# Patient Record
Sex: Female | Born: 1950 | Race: White | Hispanic: No | Marital: Married | Smoking: Never smoker
Health system: Southern US, Community
[De-identification: ages and names within clinical notes are randomized; demographics above are authoritative.]

## PROBLEM LIST (undated history)

## (undated) DIAGNOSIS — M797 Fibromyalgia: Secondary | ICD-10-CM

## (undated) DIAGNOSIS — G709 Myoneural disorder, unspecified: Secondary | ICD-10-CM

## (undated) DIAGNOSIS — M1990 Unspecified osteoarthritis, unspecified site: Secondary | ICD-10-CM

## (undated) DIAGNOSIS — Z87442 Personal history of urinary calculi: Secondary | ICD-10-CM

## (undated) DIAGNOSIS — G629 Polyneuropathy, unspecified: Secondary | ICD-10-CM

## (undated) DIAGNOSIS — E119 Type 2 diabetes mellitus without complications: Secondary | ICD-10-CM

## (undated) DIAGNOSIS — I10 Essential (primary) hypertension: Secondary | ICD-10-CM

## (undated) DIAGNOSIS — N200 Calculus of kidney: Secondary | ICD-10-CM

## (undated) DIAGNOSIS — F419 Anxiety disorder, unspecified: Secondary | ICD-10-CM

---

## 1995-06-05 HISTORY — PX: NEUROMA SURGERY: SHX722

## 1998-06-04 HISTORY — PX: ABDOMINAL HYSTERECTOMY: SHX81

## 2005-08-15 ENCOUNTER — Ambulatory Visit: Payer: Self-pay | Admitting: Orthopedic Surgery

## 2005-11-28 ENCOUNTER — Ambulatory Visit: Payer: Self-pay | Admitting: Cardiology

## 2009-07-13 ENCOUNTER — Ambulatory Visit (HOSPITAL_COMMUNITY)
Admission: RE | Admit: 2009-07-13 | Discharge: 2009-07-13 | Payer: Self-pay | Source: Home / Self Care | Admitting: Urology

## 2009-07-20 ENCOUNTER — Ambulatory Visit (HOSPITAL_COMMUNITY): Admission: RE | Admit: 2009-07-20 | Discharge: 2009-07-20 | Payer: Self-pay | Admitting: Urology

## 2010-06-04 HISTORY — PX: LITHOTRIPSY: SUR834

## 2010-06-04 HISTORY — PX: CYSTOSCOPY W/ URETERAL STENT PLACEMENT: SHX1429

## 2011-02-01 ENCOUNTER — Ambulatory Visit (HOSPITAL_COMMUNITY)
Admission: RE | Admit: 2011-02-01 | Discharge: 2011-02-01 | Disposition: A | Discharge status: Home / Self Care | Payer: Medicare Other | Source: Ambulatory Visit | Attending: Urology | Admitting: Urology

## 2011-02-01 ENCOUNTER — Other Ambulatory Visit (HOSPITAL_COMMUNITY): Payer: Self-pay | Admitting: Urology

## 2011-02-01 DIAGNOSIS — N201 Calculus of ureter: Secondary | ICD-10-CM

## 2011-02-01 DIAGNOSIS — R1031 Right lower quadrant pain: Secondary | ICD-10-CM | POA: Insufficient documentation

## 2011-02-01 DIAGNOSIS — N200 Calculus of kidney: Secondary | ICD-10-CM | POA: Insufficient documentation

## 2011-06-05 HISTORY — PX: CYSTOSCOPY W/ URETERAL STENT REMOVAL: SHX1430

## 2013-08-25 NOTE — Patient Instructions (Addendum)
Allison Oneill  08/25/2013   Your procedure is scheduled on:  08/27/2013  Report to [facility] at  37  AM.  Call this number if you have problems the morning of surgery: [phone]   Remember:   Do not eat food or drink liquids after midnight.   Take these medicines the morning of surgery with A SIP OF WATER: Allegra, Flonase spray,    Do not wear jewelry, make-up or nail polish.  Do not wear lotions, powders, or perfumes.   Do not shave 48 hours prior to surgery. Men may shave face and neck.  Do not bring valuables to the hospital.  [facility] is not responsible for any belongings or valuables.               Contacts, dentures or bridgework may not be worn into surgery.  Leave suitcase in the car. After surgery it may be brought to your room.  For patients admitted to the hospital, discharge time is determined by your treatment team.               Patients discharged the day of surgery will not be allowed to drive home.  Name and phone number of your driver: family  Special Instructions: Shower using CHG 2 nights before surgery and the night before surgery.  If you shower the day of surgery use CHG.  Use special wash - you have one bottle of CHG for all showers.  You should use approximately 1/3 of the bottle for each shower.   Please read over the following fact sheets that you were given: Pain Booklet, Coughing and Deep Breathing, Surgical Site Infection Prevention, Anesthesia Post-op Instructions and Care and Recovery After Surgery Cystoscopy Cystoscopy is a procedure that is used to help your caregiver diagnose and sometimes treat conditions that affect your lower urinary tract. Your lower urinary tract includes your bladder and the tube through which urine passes from your bladder out of your body (urethra). Cystoscopy is performed with a thin, tube-shaped instrument (cystoscope). The cystoscope has lenses and a light at the end so that your caregiver can see inside your  bladder. The cystoscope is inserted at the entrance of your urethra. Your caregiver guides it through your urethra and into your bladder. There are two main types of cystoscopy:  Flexible cystoscopy (with a flexible cystoscope).  Rigid cystoscopy (with a rigid cystoscope). Cystoscopy may be recommended for many conditions, including:  Urinary tract infections.  Blood in your urine (hematuria).  Loss of bladder control (urinary incontinence) or overactive bladder.  Unusual cells found in a urine sample.  Urinary blockage.  Painful urination. Cystoscopy may also be done to remove a sample of your tissue to be checked under a microscope (biopsy). It may also be done to remove or destroy bladder stones. LET YOUR CAREGIVER KNOW ABOUT:  Allergies to food or medicine.  Medicines taken, including vitamins, herbs, eyedrops, over-the-counter medicines, and creams.  Use of steroids (by mouth or creams).  Previous problems with anesthetics or numbing medicines.  History of bleeding problems or blood clots.  Previous surgery.  Other health problems, including diabetes and kidney problems.  Possibility of pregnancy, if this applies. PROCEDURE The area around the opening to your urethra will be cleaned. A medicine to numb your urethra (local anesthetic) is used. If a tissue sample or stone is removed during the procedure, you may be given a medicine to make you sleep (general anesthetic). Your caregiver will  gently insert the tip of the cystoscope into your urethra. The cystoscope will be slowly glided through your urethra and into your bladder. Sterile fluid will flow through the cystoscope and into your bladder. The fluid will expand and stretch your bladder. This gives your caregiver a better view of your bladder walls. The procedure lasts about 15 20 minutes. AFTER THE PROCEDURE If a local anesthetic is used, you will be allowed to go home as soon as you are ready. If a general  anesthetic is used, you will be taken to a recovery area until you are stable. You may have temporary bleeding and burning on urination. Document Released: 05/18/2000 Document Revised: 02/13/2012 Document Reviewed: 11/12/2011 [facility] Patient Information 2014 [place]. PATIENT INSTRUCTIONS POST-ANESTHESIA  IMMEDIATELY FOLLOWING SURGERY:  Do not drive or operate machinery for the first twenty four hours after surgery.  Do not make any important decisions for twenty four hours after surgery or while taking narcotic pain medications or sedatives.  If you develop intractable nausea and vomiting or a severe headache please notify your doctor immediately.  FOLLOW-UP:  Please make an appointment with your surgeon as instructed. You do not need to follow up with anesthesia unless specifically instructed to do so.  WOUND CARE INSTRUCTIONS (if applicable):  Keep a dry clean dressing on the anesthesia/puncture wound site if there is drainage.  Once the wound has quit draining you may leave it open to air.  Generally you should leave the bandage intact for twenty four hours unless there is drainage.  If the epidural site drains for more than 36-48 hours please call the anesthesia department.  QUESTIONS?:  Please feel free to call your physician or the hospital operator if you have any questions, and they will be happy to assist you.

## 2013-08-26 ENCOUNTER — Encounter (HOSPITAL_COMMUNITY): Payer: Self-pay

## 2013-08-26 ENCOUNTER — Encounter (HOSPITAL_COMMUNITY): Payer: Self-pay | Admitting: Pharmacy Technician

## 2013-08-26 ENCOUNTER — Encounter (HOSPITAL_COMMUNITY)
Admission: RE | Admit: 2013-08-26 | Discharge: 2013-08-26 | Disposition: A | Discharge status: Home / Self Care | Payer: Medicare Other | Source: Ambulatory Visit | Attending: Urology | Admitting: Urology

## 2013-08-26 HISTORY — DX: Myoneural disorder, unspecified: G70.9

## 2013-08-26 HISTORY — DX: Fibromyalgia: M79.7

## 2013-08-26 HISTORY — DX: Anxiety disorder, unspecified: F41.9

## 2013-08-26 LAB — BASIC METABOLIC PANEL
BUN: 19 mg/dL (ref 6–23)
CHLORIDE: 104 meq/L (ref 96–112)
CO2: 30 meq/L (ref 19–32)
Calcium: 10.9 mg/dL — ABNORMAL HIGH (ref 8.4–10.5)
Creatinine, Ser: 0.96 mg/dL (ref 0.50–1.10)
GFR calc non Af Amer: 61 mL/min — ABNORMAL LOW (ref >90)
GFR, EST AFRICAN AMERICAN: 71 mL/min — AB (ref >90)
GLUCOSE: 162 mg/dL — AB (ref 70–99)
POTASSIUM: 3.7 meq/L (ref 3.7–5.3)
SODIUM: 146 meq/L (ref 137–147)

## 2013-08-26 LAB — HEMOGLOBIN AND HEMATOCRIT, BLOOD
HCT: 37.6 % (ref 36.0–46.0)
Hemoglobin: 12.4 g/dL (ref 12.0–15.0)

## 2013-08-26 NOTE — Progress Notes (Signed)
Tried to call medical records to confirm history and physical dictation by Dr. Michela Pitcher. Medical records closed at 4pm. Contacted Dr. Michela Pitcher. He stated that he did dictate H&P this morning.

## 2013-08-27 ENCOUNTER — Encounter (HOSPITAL_COMMUNITY): Payer: Medicare Other | Admitting: Anesthesiology

## 2013-08-27 ENCOUNTER — Ambulatory Visit (HOSPITAL_COMMUNITY): Payer: Medicare Other | Admitting: Anesthesiology

## 2013-08-27 ENCOUNTER — Ambulatory Visit (HOSPITAL_COMMUNITY)
Admission: RE | Admit: 2013-08-27 | Discharge: 2013-08-27 | Disposition: A | Discharge status: Home / Self Care | Payer: Medicare Other | Source: Ambulatory Visit | Attending: Urology | Admitting: Urology

## 2013-08-27 ENCOUNTER — Encounter (HOSPITAL_COMMUNITY): Payer: Self-pay | Admitting: *Deleted

## 2013-08-27 ENCOUNTER — Encounter (HOSPITAL_COMMUNITY)
Admission: RE | Disposition: A | Discharge status: Home / Self Care | Payer: Self-pay | Source: Ambulatory Visit | Attending: Urology

## 2013-08-27 DIAGNOSIS — Z79899 Other long term (current) drug therapy: Secondary | ICD-10-CM | POA: Insufficient documentation

## 2013-08-27 DIAGNOSIS — F411 Generalized anxiety disorder: Secondary | ICD-10-CM | POA: Insufficient documentation

## 2013-08-27 DIAGNOSIS — N210 Calculus in bladder: Secondary | ICD-10-CM | POA: Insufficient documentation

## 2013-08-27 HISTORY — PX: CYSTOSCOPY: SHX5120

## 2013-08-27 LAB — GLUCOSE, CAPILLARY: GLUCOSE-CAPILLARY: 111 mg/dL — AB (ref 70–99)

## 2013-08-27 SURGERY — CYSTOSCOPY
Anesthesia: General | Site: Bladder

## 2013-08-27 MED ORDER — 0.9 % SODIUM CHLORIDE (POUR BTL) OPTIME
TOPICAL | Status: DC | PRN
  Administered 2013-08-27: 2000 mL

## 2013-08-27 MED ORDER — MIDAZOLAM HCL 2 MG/2ML IJ SOLN
INTRAMUSCULAR | Status: AC
  Filled 2013-08-27: qty 2

## 2013-08-27 MED ORDER — STERILE WATER FOR IRRIGATION IR SOLN
Status: DC | PRN
  Administered 2013-08-27: 1000 mL

## 2013-08-27 MED ORDER — LACTATED RINGERS IV SOLN: INTRAVENOUS | Status: DC

## 2013-08-27 MED ORDER — PROPOFOL 10 MG/ML IV EMUL
INTRAVENOUS | Status: AC
  Filled 2013-08-27: qty 20

## 2013-08-27 MED ORDER — FENTANYL CITRATE 0.05 MG/ML IJ SOLN
INTRAMUSCULAR | Status: AC
  Filled 2013-08-27: qty 5

## 2013-08-27 MED ORDER — SUCCINYLCHOLINE CHLORIDE 20 MG/ML IJ SOLN
INTRAMUSCULAR | Status: AC
  Filled 2013-08-27: qty 1

## 2013-08-27 MED ORDER — SODIUM CHLORIDE 0.9 % IR SOLN
Status: DC | PRN
  Administered 2013-08-27 (×2): 3000 mL via INTRAVESICAL

## 2013-08-27 MED ORDER — FENTANYL CITRATE 0.05 MG/ML IJ SOLN
25.0000 ug | INTRAMUSCULAR | Status: DC | PRN
Start: 2013-08-27 — End: 2013-08-27

## 2013-08-27 MED ORDER — ONDANSETRON HCL 4 MG/2ML IJ SOLN
INTRAMUSCULAR | Status: AC
  Filled 2013-08-27: qty 2

## 2013-08-27 MED ORDER — LIDOCAINE HCL (CARDIAC) 20 MG/ML IV SOLN
INTRAVENOUS | Status: DC | PRN
  Administered 2013-08-27: 50 mg via INTRAVENOUS

## 2013-08-27 MED ORDER — FENTANYL CITRATE 0.05 MG/ML IJ SOLN
INTRAMUSCULAR | Status: AC
  Filled 2013-08-27: qty 2

## 2013-08-27 MED ORDER — MIDAZOLAM HCL 2 MG/2ML IJ SOLN
1.0000 mg | INTRAMUSCULAR | Status: DC | PRN
  Administered 2013-08-27: 2 mg via INTRAVENOUS

## 2013-08-27 MED ORDER — ONDANSETRON HCL 4 MG/2ML IJ SOLN: 4.0000 mg | Freq: Once | INTRAMUSCULAR | Status: DC | PRN

## 2013-08-27 MED ORDER — LIDOCAINE HCL (PF) 1 % IJ SOLN
INTRAMUSCULAR | Status: AC
  Filled 2013-08-27: qty 10

## 2013-08-27 MED ORDER — LACTATED RINGERS IV SOLN
INTRAVENOUS | Status: DC | PRN
  Administered 2013-08-27: 07:00:00 via INTRAVENOUS

## 2013-08-27 MED ORDER — FENTANYL CITRATE 0.05 MG/ML IJ SOLN
25.0000 ug | INTRAMUSCULAR | Status: AC
  Administered 2013-08-27: 25 ug via INTRAVENOUS

## 2013-08-27 MED ORDER — ONDANSETRON HCL 4 MG/2ML IJ SOLN
4.0000 mg | Freq: Once | INTRAMUSCULAR | Status: AC
  Administered 2013-08-27: 4 mg via INTRAVENOUS

## 2013-08-27 MED ORDER — PROPOFOL 10 MG/ML IV BOLUS
INTRAVENOUS | Status: DC | PRN
  Administered 2013-08-27: 150 mg via INTRAVENOUS

## 2013-08-27 MED ORDER — FENTANYL CITRATE 0.05 MG/ML IJ SOLN
INTRAMUSCULAR | Status: DC | PRN
  Administered 2013-08-27: 25 ug via INTRAVENOUS
  Administered 2013-08-27: 50 ug via INTRAVENOUS
  Administered 2013-08-27: 100 ug via INTRAVENOUS

## 2013-08-27 SURGICAL SUPPLY — 18 items
BAG DRAIN URO TABLE W/ADPT NS (DRAPE) ×3 IMPLANT
BAG DRN 8 ADPR NS SKTRN CSTL (DRAPE) ×1
BAG HAMPER (MISCELLANEOUS) ×3 IMPLANT
CLOTH BEACON ORANGE TIMEOUT ST (SAFETY) ×3 IMPLANT
GLOVE BIO SURGEON STRL SZ7 (GLOVE) ×3 IMPLANT
GLOVE BIOGEL PI IND STRL 7.0 (GLOVE) ×2 IMPLANT
GLOVE BIOGEL PI INDICATOR 7.0 (GLOVE) ×4
GLOVE ECLIPSE 6.5 STRL STRAW (GLOVE) ×2 IMPLANT
GOWN STRL REUS W/TWL LRG LVL3 (GOWN DISPOSABLE) ×3 IMPLANT
IV NS IRRIG 3000ML ARTHROMATIC (IV SOLUTION) ×6 IMPLANT
KIT ROOM TURNOVER AP CYSTO (KITS) ×3 IMPLANT
MANIFOLD NEPTUNE II (INSTRUMENTS) ×3 IMPLANT
NS IRRIG 1000ML POUR BTL (IV SOLUTION) ×4 IMPLANT
PACK CYSTO (CUSTOM PROCEDURE TRAY) ×3 IMPLANT
PAD ARMBOARD 7.5X6 YLW CONV (MISCELLANEOUS) ×3 IMPLANT
SET IRRIGATING DISP (SET/KITS/TRAYS/PACK) ×3 IMPLANT
TOWEL OR 17X26 4PK STRL BLUE (TOWEL DISPOSABLE) ×3 IMPLANT
WATER STERILE IRR 1000ML POUR (IV SOLUTION) ×3 IMPLANT

## 2013-08-27 NOTE — Preoperative (Signed)
Beta Blockers   Reason not to administer Beta Blockers:Not Applicable 

## 2013-08-27 NOTE — Transfer of Care (Signed)
Immediate Anesthesia Transfer of Care Note  Patient: Allison Oneill  Procedure(s) Performed: Procedure(s): CYSTOSCOPY; REMOVAL OF BLADDER CALCULUS (N/A)  Patient Location: PACU  Anesthesia Type:General  Level of Consciousness: awake, alert , oriented and patient cooperative  Airway & Oxygen Therapy: Patient Spontanous Breathing and Patient connected to face mask oxygen  Post-op Assessment: Report given to PACU RN and Post -op Vital signs reviewed and stable  Post vital signs: Reviewed and stable  Complications: No apparent anesthesia complications

## 2013-08-27 NOTE — Anesthesia Procedure Notes (Signed)
Procedure Name: LMA Insertion Date/Time: 08/27/2013 7:45 AM Performed by: Andree Elk, Ladislao Cohenour A Pre-anesthesia Checklist: Patient identified, Timeout performed, Emergency Drugs available, Suction available and Patient being monitored Patient Re-evaluated:Patient Re-evaluated prior to inductionOxygen Delivery Method: Circle system utilized Preoxygenation: Pre-oxygenation with 100% oxygen Intubation Type: IV induction Ventilation: Mask ventilation without difficulty LMA: LMA inserted LMA Size: 4.0 Number of attempts: 1 Placement Confirmation: positive ETCO2 and breath sounds checked- equal and bilateral Tube secured with: Tape Dental Injury: Teeth and Oropharynx as per pre-operative assessment

## 2013-08-27 NOTE — Brief Op Note (Signed)
08/27/2013  8:14 AM  PATIENT:  Allison Oneill  62 y.o. female  PRE-OPERATIVE DIAGNOSIS:  Bladder Calculus  POST-OPERATIVE DIAGNOSIS:  Bladder Calculus  PROCEDURE:  Procedure(s): CYSTOSCOPY; REMOVAL OF BLADDER CALCULUS (N/A)  SURGEON:  Surgeon(s) and Role:    * [name], MD - Primary  PHYSICIAN ASSISTANT:   ASSISTANTS: none   ANESTHESIA:   general  EBL:  Total I/O In: 500 [I.V.:500] Out: -   BLOOD ADMINISTERED:none  DRAINS: none   LOCAL MEDICATIONS USED:  Amount: none  SPECIMEN:  No Specimenstone given to the pt to bring it in office to send to the lab.  DISPOSITION OF SPECIMEN:  N/A  COUNTS:  YES  TOURNIQUET:  * No tourniquets in log *  DICTATION: .Other Dictation: Dictation Number dictation [id number]  PLAN OF CARE: Discharge to home after PACU  PATIENT DISPOSITION:  PACU - hemodynamically stable.   Delay start of Pharmacological VTE agent (>24hrs) due to surgical blood loss or risk of bleeding:

## 2013-08-27 NOTE — Discharge Instructions (Signed)
Will see in office in 2weeks call for any fever bleeding or pain  Cystoscopy, Care After Refer to this sheet in the next few weeks. These instructions provide you with information on caring for yourself after your procedure. Your caregiver may also give you more specific instructions. Your treatment has been planned according to current medical practices, but problems sometimes occur. Call your caregiver if you have any problems or questions after your procedure. HOME CARE INSTRUCTIONS  Things you can do to ease any discomfort after your procedure include:  Drinking enough water and fluids to keep your urine clear or pale yellow.  Taking a warm bath to relieve any burning feelings. SEEK IMMEDIATE MEDICAL CARE IF:   You have an increase in blood in your urine.  You notice blood clots in your urine.  You have difficulty passing urine.  You have the chills.  You have abdominal pain.  You have a fever or persistent symptoms for more than 2 3 days.  You have a fever and your symptoms suddenly get worse. MAKE SURE YOU:   Understand these instructions.  Will watch your condition.  Will get help right away if you are not doing well or get worse.

## 2013-08-27 NOTE — Anesthesia Preprocedure Evaluation (Signed)
Anesthesia Evaluation  Patient identified by MRN, date of birth, ID band Patient awake    Reviewed: Allergy & Precautions, H&P , NPO status , Patient's Chart, lab work & pertinent test results  History of Anesthesia Complications Negative for: history of anesthetic complications  Airway Mallampati: I TM Distance: >3 FB Neck ROM: Full    Dental  (+) Teeth Intact   Pulmonary neg pulmonary ROS,  breath sounds clear to auscultation        Cardiovascular negative cardio ROS  Rhythm:Regular Rate:Normal     Neuro/Psych PSYCHIATRIC DISORDERS Anxiety    GI/Hepatic negative GI ROS,   Endo/Other    Renal/GU      Musculoskeletal  (+) Fibromyalgia -  Abdominal   Peds  Hematology   Anesthesia Other Findings   Reproductive/Obstetrics                           Anesthesia Physical Anesthesia Plan  ASA: II  Anesthesia Plan: General   Post-op Pain Management:    Induction: Intravenous  Airway Management Planned: LMA  Additional Equipment:   Intra-op Plan:   Post-operative Plan: Extubation in OR  Informed Consent: I have reviewed the patients History and Physical, chart, labs and discussed the procedure including the risks, benefits and alternatives for the proposed anesthesia with the patient or authorized representative who has indicated his/her understanding and acceptance.     Plan Discussed with:   Anesthesia Plan Comments:         Anesthesia Quick Evaluation

## 2013-08-27 NOTE — Progress Notes (Signed)
No change in H&P on reexamination. 

## 2013-08-27 NOTE — Anesthesia Postprocedure Evaluation (Signed)
  Anesthesia Post-op Note  Patient: Allison Oneill  Procedure(s) Performed: Procedure(s): CYSTOSCOPY; REMOVAL OF BLADDER CALCULUS (N/A)  Patient Location: PACU  Anesthesia Type:General  Level of Consciousness: awake, alert , oriented and patient cooperative  Airway and Oxygen Therapy: Patient Spontanous Breathing and Patient connected to face mask oxygen  Post-op Pain: none  Post-op Assessment: Post-op Vital signs reviewed, Patient's Cardiovascular Status Stable, Respiratory Function Stable, Patent Airway, No signs of Nausea or vomiting and Pain level controlled  Post-op Vital Signs: Reviewed and stable  Complications: No apparent anesthesia complications

## 2013-08-28 ENCOUNTER — Encounter (HOSPITAL_COMMUNITY): Payer: Self-pay | Admitting: Urology

## 2013-08-28 NOTE — Op Note (Signed)
NAMEJINI, Allison Oneill               ACCOUNT NO.:  [id number]  MEDICAL RECORD NO.:  [id number]  LOCATION:  [facility]                          FACILITY:  [facility]  PHYSICIAN:  [name], M.D.DATE OF BIRTH:  January 02, 1951  DATE OF PROCEDURE: DATE OF DISCHARGE:  08/27/2013                              OPERATIVE REPORT   PREOPERATIVE DIAGNOSIS:  Bladder stone.  POSTOPERATIVE DIAGNOSIS:  Bladder stone.  PROCEDURE:  Cysto extraction of bladder stone.  ANESTHESIA:  General.  DESCRIPTION OF PROCEDURE:  The patient under general anesthesia in lithotomy position, after usual prep and drape, #25 cystoscope introduced into the bladder.  It was inspected.  The stone was visualized.  It was held with the help of a foreign body forceps.  We removed without any complications.  The patient left the operating room in satisfactory condition.  There was only 1 stone which I removed and there was no other stone. Bladder was inspected, looks fine.  The patient left the operating room in satisfactory condition.     [name], M.D.     MIJ/MEDQ  D:  08/27/2013  T:  08/27/2013  Job:  [id number]

## 2014-07-14 DIAGNOSIS — I739 Peripheral vascular disease, unspecified: Secondary | ICD-10-CM | POA: Diagnosis not present

## 2014-07-14 DIAGNOSIS — L609 Nail disorder, unspecified: Secondary | ICD-10-CM | POA: Diagnosis not present

## 2014-07-14 DIAGNOSIS — L110 Acquired keratosis follicularis: Secondary | ICD-10-CM | POA: Diagnosis not present

## 2014-07-22 DIAGNOSIS — E780 Pure hypercholesterolemia: Secondary | ICD-10-CM | POA: Diagnosis not present

## 2014-07-22 DIAGNOSIS — N3000 Acute cystitis without hematuria: Secondary | ICD-10-CM | POA: Diagnosis not present

## 2014-07-22 DIAGNOSIS — R590 Localized enlarged lymph nodes: Secondary | ICD-10-CM | POA: Diagnosis not present

## 2014-07-22 DIAGNOSIS — I10 Essential (primary) hypertension: Secondary | ICD-10-CM | POA: Diagnosis not present

## 2014-07-28 DIAGNOSIS — N200 Calculus of kidney: Secondary | ICD-10-CM | POA: Diagnosis not present

## 2014-07-28 DIAGNOSIS — Z1389 Encounter for screening for other disorder: Secondary | ICD-10-CM | POA: Diagnosis not present

## 2014-07-28 DIAGNOSIS — I10 Essential (primary) hypertension: Secondary | ICD-10-CM | POA: Diagnosis not present

## 2014-07-28 DIAGNOSIS — E780 Pure hypercholesterolemia: Secondary | ICD-10-CM | POA: Diagnosis not present

## 2014-09-01 DIAGNOSIS — R300 Dysuria: Secondary | ICD-10-CM | POA: Diagnosis not present

## 2014-09-01 DIAGNOSIS — M545 Low back pain: Secondary | ICD-10-CM | POA: Diagnosis not present

## 2014-09-01 DIAGNOSIS — R110 Nausea: Secondary | ICD-10-CM | POA: Diagnosis not present

## 2014-10-20 DIAGNOSIS — I739 Peripheral vascular disease, unspecified: Secondary | ICD-10-CM | POA: Diagnosis not present

## 2014-10-20 DIAGNOSIS — L110 Acquired keratosis follicularis: Secondary | ICD-10-CM | POA: Diagnosis not present

## 2014-10-20 DIAGNOSIS — L609 Nail disorder, unspecified: Secondary | ICD-10-CM | POA: Diagnosis not present

## 2014-11-30 DIAGNOSIS — N3000 Acute cystitis without hematuria: Secondary | ICD-10-CM | POA: Diagnosis not present

## 2014-12-31 DIAGNOSIS — R350 Frequency of micturition: Secondary | ICD-10-CM | POA: Diagnosis not present

## 2015-01-25 DIAGNOSIS — Z1231 Encounter for screening mammogram for malignant neoplasm of breast: Secondary | ICD-10-CM | POA: Diagnosis not present

## 2015-03-15 DIAGNOSIS — R590 Localized enlarged lymph nodes: Secondary | ICD-10-CM | POA: Diagnosis not present

## 2015-03-15 DIAGNOSIS — I10 Essential (primary) hypertension: Secondary | ICD-10-CM | POA: Diagnosis not present

## 2015-03-15 DIAGNOSIS — E7800 Pure hypercholesterolemia, unspecified: Secondary | ICD-10-CM | POA: Diagnosis not present

## 2015-03-15 DIAGNOSIS — E119 Type 2 diabetes mellitus without complications: Secondary | ICD-10-CM | POA: Diagnosis not present

## 2015-03-22 DIAGNOSIS — I10 Essential (primary) hypertension: Secondary | ICD-10-CM | POA: Diagnosis not present

## 2015-03-22 DIAGNOSIS — Z0000 Encounter for general adult medical examination without abnormal findings: Secondary | ICD-10-CM | POA: Diagnosis not present

## 2015-05-18 DIAGNOSIS — M79672 Pain in left foot: Secondary | ICD-10-CM | POA: Diagnosis not present

## 2015-05-18 DIAGNOSIS — M79671 Pain in right foot: Secondary | ICD-10-CM | POA: Diagnosis not present

## 2015-05-18 DIAGNOSIS — S93331D Other subluxation of right foot, subsequent encounter: Secondary | ICD-10-CM | POA: Diagnosis not present

## 2015-05-18 DIAGNOSIS — S93332D Other subluxation of left foot, subsequent encounter: Secondary | ICD-10-CM | POA: Diagnosis not present

## 2015-05-23 DIAGNOSIS — L03211 Cellulitis of face: Secondary | ICD-10-CM | POA: Diagnosis not present

## 2015-06-25 DIAGNOSIS — M797 Fibromyalgia: Secondary | ICD-10-CM | POA: Diagnosis not present

## 2015-06-25 DIAGNOSIS — N201 Calculus of ureter: Secondary | ICD-10-CM | POA: Diagnosis not present

## 2015-06-25 DIAGNOSIS — N1330 Unspecified hydronephrosis: Secondary | ICD-10-CM | POA: Diagnosis not present

## 2015-06-25 DIAGNOSIS — E785 Hyperlipidemia, unspecified: Secondary | ICD-10-CM | POA: Diagnosis not present

## 2015-06-25 DIAGNOSIS — N132 Hydronephrosis with renal and ureteral calculous obstruction: Secondary | ICD-10-CM | POA: Diagnosis not present

## 2015-06-25 DIAGNOSIS — M1990 Unspecified osteoarthritis, unspecified site: Secondary | ICD-10-CM | POA: Diagnosis not present

## 2015-06-25 DIAGNOSIS — R1030 Lower abdominal pain, unspecified: Secondary | ICD-10-CM | POA: Diagnosis not present

## 2015-06-25 DIAGNOSIS — Z79899 Other long term (current) drug therapy: Secondary | ICD-10-CM | POA: Diagnosis not present

## 2015-06-25 DIAGNOSIS — Z87442 Personal history of urinary calculi: Secondary | ICD-10-CM | POA: Diagnosis not present

## 2015-07-07 DIAGNOSIS — L249 Irritant contact dermatitis, unspecified cause: Secondary | ICD-10-CM | POA: Diagnosis not present

## 2015-08-18 DIAGNOSIS — M79672 Pain in left foot: Secondary | ICD-10-CM | POA: Diagnosis not present

## 2015-08-18 DIAGNOSIS — S93332D Other subluxation of left foot, subsequent encounter: Secondary | ICD-10-CM | POA: Diagnosis not present

## 2015-08-18 DIAGNOSIS — M79671 Pain in right foot: Secondary | ICD-10-CM | POA: Diagnosis not present

## 2015-08-18 DIAGNOSIS — S93331D Other subluxation of right foot, subsequent encounter: Secondary | ICD-10-CM | POA: Diagnosis not present

## 2015-08-23 DIAGNOSIS — J09X2 Influenza due to identified novel influenza A virus with other respiratory manifestations: Secondary | ICD-10-CM | POA: Diagnosis not present

## 2015-09-15 DIAGNOSIS — R590 Localized enlarged lymph nodes: Secondary | ICD-10-CM | POA: Diagnosis not present

## 2015-09-15 DIAGNOSIS — I10 Essential (primary) hypertension: Secondary | ICD-10-CM | POA: Diagnosis not present

## 2015-09-15 DIAGNOSIS — Z13220 Encounter for screening for lipoid disorders: Secondary | ICD-10-CM | POA: Diagnosis not present

## 2015-09-15 DIAGNOSIS — E7800 Pure hypercholesterolemia, unspecified: Secondary | ICD-10-CM | POA: Diagnosis not present

## 2015-09-20 DIAGNOSIS — E1165 Type 2 diabetes mellitus with hyperglycemia: Secondary | ICD-10-CM | POA: Diagnosis not present

## 2015-09-20 DIAGNOSIS — I10 Essential (primary) hypertension: Secondary | ICD-10-CM | POA: Diagnosis not present

## 2015-09-20 DIAGNOSIS — G619 Inflammatory polyneuropathy, unspecified: Secondary | ICD-10-CM | POA: Diagnosis not present

## 2015-09-20 DIAGNOSIS — Z13220 Encounter for screening for lipoid disorders: Secondary | ICD-10-CM | POA: Diagnosis not present

## 2015-11-03 DIAGNOSIS — M79672 Pain in left foot: Secondary | ICD-10-CM | POA: Diagnosis not present

## 2015-11-03 DIAGNOSIS — S93332D Other subluxation of left foot, subsequent encounter: Secondary | ICD-10-CM | POA: Diagnosis not present

## 2015-11-03 DIAGNOSIS — M779 Enthesopathy, unspecified: Secondary | ICD-10-CM | POA: Diagnosis not present

## 2015-11-09 DIAGNOSIS — G2581 Restless legs syndrome: Secondary | ICD-10-CM | POA: Diagnosis not present

## 2015-11-09 DIAGNOSIS — M797 Fibromyalgia: Secondary | ICD-10-CM | POA: Diagnosis not present

## 2015-11-09 DIAGNOSIS — M545 Low back pain: Secondary | ICD-10-CM | POA: Diagnosis not present

## 2015-11-09 DIAGNOSIS — Z79899 Other long term (current) drug therapy: Secondary | ICD-10-CM | POA: Diagnosis not present

## 2015-11-09 DIAGNOSIS — G8929 Other chronic pain: Secondary | ICD-10-CM | POA: Diagnosis not present

## 2015-11-09 DIAGNOSIS — Z87442 Personal history of urinary calculi: Secondary | ICD-10-CM | POA: Diagnosis not present

## 2015-11-24 DIAGNOSIS — M779 Enthesopathy, unspecified: Secondary | ICD-10-CM | POA: Diagnosis not present

## 2015-11-24 DIAGNOSIS — S93331D Other subluxation of right foot, subsequent encounter: Secondary | ICD-10-CM | POA: Diagnosis not present

## 2015-11-24 DIAGNOSIS — M79672 Pain in left foot: Secondary | ICD-10-CM | POA: Diagnosis not present

## 2015-11-24 DIAGNOSIS — M79671 Pain in right foot: Secondary | ICD-10-CM | POA: Diagnosis not present

## 2016-01-26 DIAGNOSIS — E1165 Type 2 diabetes mellitus with hyperglycemia: Secondary | ICD-10-CM | POA: Diagnosis not present

## 2016-01-26 DIAGNOSIS — N200 Calculus of kidney: Secondary | ICD-10-CM | POA: Diagnosis not present

## 2016-01-26 DIAGNOSIS — G619 Inflammatory polyneuropathy, unspecified: Secondary | ICD-10-CM | POA: Diagnosis not present

## 2016-01-26 DIAGNOSIS — I10 Essential (primary) hypertension: Secondary | ICD-10-CM | POA: Diagnosis not present

## 2016-01-26 DIAGNOSIS — E7800 Pure hypercholesterolemia, unspecified: Secondary | ICD-10-CM | POA: Diagnosis not present

## 2016-01-30 DIAGNOSIS — E7800 Pure hypercholesterolemia, unspecified: Secondary | ICD-10-CM | POA: Diagnosis not present

## 2016-01-30 DIAGNOSIS — E1165 Type 2 diabetes mellitus with hyperglycemia: Secondary | ICD-10-CM | POA: Diagnosis not present

## 2016-01-30 DIAGNOSIS — I10 Essential (primary) hypertension: Secondary | ICD-10-CM | POA: Diagnosis not present

## 2016-01-30 DIAGNOSIS — E1140 Type 2 diabetes mellitus with diabetic neuropathy, unspecified: Secondary | ICD-10-CM | POA: Diagnosis not present

## 2016-02-21 DIAGNOSIS — Z1231 Encounter for screening mammogram for malignant neoplasm of breast: Secondary | ICD-10-CM | POA: Diagnosis not present

## 2016-03-05 DIAGNOSIS — H43811 Vitreous degeneration, right eye: Secondary | ICD-10-CM | POA: Diagnosis not present

## 2016-03-05 DIAGNOSIS — Z7984 Long term (current) use of oral hypoglycemic drugs: Secondary | ICD-10-CM | POA: Diagnosis not present

## 2016-07-12 DIAGNOSIS — L609 Nail disorder, unspecified: Secondary | ICD-10-CM | POA: Diagnosis not present

## 2016-07-12 DIAGNOSIS — L110 Acquired keratosis follicularis: Secondary | ICD-10-CM | POA: Diagnosis not present

## 2016-07-12 DIAGNOSIS — I739 Peripheral vascular disease, unspecified: Secondary | ICD-10-CM | POA: Diagnosis not present

## 2016-07-19 DIAGNOSIS — E1165 Type 2 diabetes mellitus with hyperglycemia: Secondary | ICD-10-CM | POA: Diagnosis not present

## 2016-07-19 DIAGNOSIS — E1140 Type 2 diabetes mellitus with diabetic neuropathy, unspecified: Secondary | ICD-10-CM | POA: Diagnosis not present

## 2016-07-19 DIAGNOSIS — I10 Essential (primary) hypertension: Secondary | ICD-10-CM | POA: Diagnosis not present

## 2016-07-19 DIAGNOSIS — E7800 Pure hypercholesterolemia, unspecified: Secondary | ICD-10-CM | POA: Diagnosis not present

## 2016-08-01 DIAGNOSIS — E1140 Type 2 diabetes mellitus with diabetic neuropathy, unspecified: Secondary | ICD-10-CM | POA: Diagnosis not present

## 2016-08-01 DIAGNOSIS — E1165 Type 2 diabetes mellitus with hyperglycemia: Secondary | ICD-10-CM | POA: Diagnosis not present

## 2016-08-01 DIAGNOSIS — I10 Essential (primary) hypertension: Secondary | ICD-10-CM | POA: Diagnosis not present

## 2016-08-01 DIAGNOSIS — E7800 Pure hypercholesterolemia, unspecified: Secondary | ICD-10-CM | POA: Diagnosis not present

## 2016-08-07 DIAGNOSIS — N200 Calculus of kidney: Secondary | ICD-10-CM | POA: Diagnosis not present

## 2016-08-07 DIAGNOSIS — R1011 Right upper quadrant pain: Secondary | ICD-10-CM | POA: Diagnosis not present

## 2016-08-07 DIAGNOSIS — K760 Fatty (change of) liver, not elsewhere classified: Secondary | ICD-10-CM | POA: Diagnosis not present

## 2016-10-08 DIAGNOSIS — M5432 Sciatica, left side: Secondary | ICD-10-CM | POA: Diagnosis not present

## 2017-02-01 DIAGNOSIS — E1165 Type 2 diabetes mellitus with hyperglycemia: Secondary | ICD-10-CM | POA: Diagnosis not present

## 2017-02-01 DIAGNOSIS — E1140 Type 2 diabetes mellitus with diabetic neuropathy, unspecified: Secondary | ICD-10-CM | POA: Diagnosis not present

## 2017-02-01 DIAGNOSIS — E7800 Pure hypercholesterolemia, unspecified: Secondary | ICD-10-CM | POA: Diagnosis not present

## 2017-02-01 DIAGNOSIS — I10 Essential (primary) hypertension: Secondary | ICD-10-CM | POA: Diagnosis not present

## 2017-02-06 DIAGNOSIS — Z0001 Encounter for general adult medical examination with abnormal findings: Secondary | ICD-10-CM | POA: Diagnosis not present

## 2017-02-06 DIAGNOSIS — I10 Essential (primary) hypertension: Secondary | ICD-10-CM | POA: Diagnosis not present

## 2017-02-06 DIAGNOSIS — E1165 Type 2 diabetes mellitus with hyperglycemia: Secondary | ICD-10-CM | POA: Diagnosis not present

## 2017-02-08 DIAGNOSIS — M545 Low back pain: Secondary | ICD-10-CM | POA: Diagnosis not present

## 2017-02-08 DIAGNOSIS — S39012A Strain of muscle, fascia and tendon of lower back, initial encounter: Secondary | ICD-10-CM | POA: Diagnosis not present

## 2017-02-08 DIAGNOSIS — Z79899 Other long term (current) drug therapy: Secondary | ICD-10-CM | POA: Diagnosis not present

## 2017-02-08 DIAGNOSIS — S3993XA Unspecified injury of pelvis, initial encounter: Secondary | ICD-10-CM | POA: Diagnosis not present

## 2017-02-08 DIAGNOSIS — M797 Fibromyalgia: Secondary | ICD-10-CM | POA: Diagnosis not present

## 2017-02-08 DIAGNOSIS — M25551 Pain in right hip: Secondary | ICD-10-CM | POA: Diagnosis not present

## 2017-02-08 DIAGNOSIS — W1839XA Other fall on same level, initial encounter: Secondary | ICD-10-CM | POA: Diagnosis not present

## 2017-02-08 DIAGNOSIS — M810 Age-related osteoporosis without current pathological fracture: Secondary | ICD-10-CM | POA: Diagnosis not present

## 2017-02-25 DIAGNOSIS — Z1231 Encounter for screening mammogram for malignant neoplasm of breast: Secondary | ICD-10-CM | POA: Diagnosis not present

## 2017-02-27 DIAGNOSIS — R195 Other fecal abnormalities: Secondary | ICD-10-CM | POA: Diagnosis not present

## 2017-03-21 DIAGNOSIS — K640 First degree hemorrhoids: Secondary | ICD-10-CM | POA: Diagnosis not present

## 2017-03-21 DIAGNOSIS — Q438 Other specified congenital malformations of intestine: Secondary | ICD-10-CM | POA: Diagnosis not present

## 2017-03-21 DIAGNOSIS — Z882 Allergy status to sulfonamides status: Secondary | ICD-10-CM | POA: Diagnosis not present

## 2017-03-21 DIAGNOSIS — M1990 Unspecified osteoarthritis, unspecified site: Secondary | ICD-10-CM | POA: Diagnosis not present

## 2017-03-21 DIAGNOSIS — M810 Age-related osteoporosis without current pathological fracture: Secondary | ICD-10-CM | POA: Diagnosis not present

## 2017-03-21 DIAGNOSIS — Z79899 Other long term (current) drug therapy: Secondary | ICD-10-CM | POA: Diagnosis not present

## 2017-03-21 DIAGNOSIS — I10 Essential (primary) hypertension: Secondary | ICD-10-CM | POA: Diagnosis not present

## 2017-03-21 DIAGNOSIS — E119 Type 2 diabetes mellitus without complications: Secondary | ICD-10-CM | POA: Diagnosis not present

## 2017-03-21 DIAGNOSIS — Z87442 Personal history of urinary calculi: Secondary | ICD-10-CM | POA: Diagnosis not present

## 2017-03-21 DIAGNOSIS — D120 Benign neoplasm of cecum: Secondary | ICD-10-CM | POA: Diagnosis not present

## 2017-03-21 DIAGNOSIS — D122 Benign neoplasm of ascending colon: Secondary | ICD-10-CM | POA: Diagnosis not present

## 2017-03-21 DIAGNOSIS — Z800 Family history of malignant neoplasm of digestive organs: Secondary | ICD-10-CM | POA: Diagnosis not present

## 2017-03-21 DIAGNOSIS — K644 Residual hemorrhoidal skin tags: Secondary | ICD-10-CM | POA: Diagnosis not present

## 2017-03-21 DIAGNOSIS — Z7984 Long term (current) use of oral hypoglycemic drugs: Secondary | ICD-10-CM | POA: Diagnosis not present

## 2017-03-21 DIAGNOSIS — M797 Fibromyalgia: Secondary | ICD-10-CM | POA: Diagnosis not present

## 2017-03-21 DIAGNOSIS — E7800 Pure hypercholesterolemia, unspecified: Secondary | ICD-10-CM | POA: Diagnosis not present

## 2017-03-21 DIAGNOSIS — R195 Other fecal abnormalities: Secondary | ICD-10-CM | POA: Diagnosis not present

## 2017-03-21 DIAGNOSIS — G2581 Restless legs syndrome: Secondary | ICD-10-CM | POA: Diagnosis not present

## 2017-03-21 DIAGNOSIS — E7849 Other hyperlipidemia: Secondary | ICD-10-CM | POA: Diagnosis not present

## 2017-03-21 DIAGNOSIS — G629 Polyneuropathy, unspecified: Secondary | ICD-10-CM | POA: Diagnosis not present

## 2017-04-17 DIAGNOSIS — D126 Benign neoplasm of colon, unspecified: Secondary | ICD-10-CM | POA: Diagnosis not present

## 2017-06-03 DIAGNOSIS — Z780 Asymptomatic menopausal state: Secondary | ICD-10-CM | POA: Diagnosis not present

## 2017-06-03 DIAGNOSIS — M810 Age-related osteoporosis without current pathological fracture: Secondary | ICD-10-CM | POA: Diagnosis not present

## 2017-07-29 DIAGNOSIS — E1165 Type 2 diabetes mellitus with hyperglycemia: Secondary | ICD-10-CM | POA: Diagnosis not present

## 2017-07-29 DIAGNOSIS — E7800 Pure hypercholesterolemia, unspecified: Secondary | ICD-10-CM | POA: Diagnosis not present

## 2017-07-29 DIAGNOSIS — E1140 Type 2 diabetes mellitus with diabetic neuropathy, unspecified: Secondary | ICD-10-CM | POA: Diagnosis not present

## 2017-07-29 DIAGNOSIS — I10 Essential (primary) hypertension: Secondary | ICD-10-CM | POA: Diagnosis not present

## 2017-07-31 DIAGNOSIS — I10 Essential (primary) hypertension: Secondary | ICD-10-CM | POA: Diagnosis not present

## 2017-07-31 DIAGNOSIS — E1165 Type 2 diabetes mellitus with hyperglycemia: Secondary | ICD-10-CM | POA: Diagnosis not present

## 2017-07-31 DIAGNOSIS — M129 Arthropathy, unspecified: Secondary | ICD-10-CM | POA: Diagnosis not present

## 2017-07-31 DIAGNOSIS — E1140 Type 2 diabetes mellitus with diabetic neuropathy, unspecified: Secondary | ICD-10-CM | POA: Diagnosis not present

## 2017-08-22 DIAGNOSIS — I739 Peripheral vascular disease, unspecified: Secondary | ICD-10-CM | POA: Diagnosis not present

## 2017-08-22 DIAGNOSIS — L110 Acquired keratosis follicularis: Secondary | ICD-10-CM | POA: Diagnosis not present

## 2017-08-22 DIAGNOSIS — L609 Nail disorder, unspecified: Secondary | ICD-10-CM | POA: Diagnosis not present

## 2017-09-16 DIAGNOSIS — G619 Inflammatory polyneuropathy, unspecified: Secondary | ICD-10-CM | POA: Diagnosis not present

## 2017-09-16 DIAGNOSIS — E1140 Type 2 diabetes mellitus with diabetic neuropathy, unspecified: Secondary | ICD-10-CM | POA: Diagnosis not present

## 2017-09-16 DIAGNOSIS — R5383 Other fatigue: Secondary | ICD-10-CM | POA: Diagnosis not present

## 2017-09-16 DIAGNOSIS — E1165 Type 2 diabetes mellitus with hyperglycemia: Secondary | ICD-10-CM | POA: Diagnosis not present

## 2017-09-30 DIAGNOSIS — H35443 Age-related reticular degeneration of retina, bilateral: Secondary | ICD-10-CM | POA: Diagnosis not present

## 2017-10-30 DIAGNOSIS — I739 Peripheral vascular disease, unspecified: Secondary | ICD-10-CM | POA: Diagnosis not present

## 2017-10-30 DIAGNOSIS — L609 Nail disorder, unspecified: Secondary | ICD-10-CM | POA: Diagnosis not present

## 2017-10-30 DIAGNOSIS — L110 Acquired keratosis follicularis: Secondary | ICD-10-CM | POA: Diagnosis not present

## 2017-12-31 DIAGNOSIS — E785 Hyperlipidemia, unspecified: Secondary | ICD-10-CM | POA: Diagnosis not present

## 2017-12-31 DIAGNOSIS — E1140 Type 2 diabetes mellitus with diabetic neuropathy, unspecified: Secondary | ICD-10-CM | POA: Diagnosis not present

## 2017-12-31 DIAGNOSIS — I10 Essential (primary) hypertension: Secondary | ICD-10-CM | POA: Diagnosis not present

## 2017-12-31 DIAGNOSIS — E1165 Type 2 diabetes mellitus with hyperglycemia: Secondary | ICD-10-CM | POA: Diagnosis not present

## 2018-03-03 DIAGNOSIS — E1165 Type 2 diabetes mellitus with hyperglycemia: Secondary | ICD-10-CM | POA: Diagnosis not present

## 2018-03-03 DIAGNOSIS — I10 Essential (primary) hypertension: Secondary | ICD-10-CM | POA: Diagnosis not present

## 2018-03-03 DIAGNOSIS — R5383 Other fatigue: Secondary | ICD-10-CM | POA: Diagnosis not present

## 2018-03-03 DIAGNOSIS — E1140 Type 2 diabetes mellitus with diabetic neuropathy, unspecified: Secondary | ICD-10-CM | POA: Diagnosis not present

## 2018-03-03 DIAGNOSIS — E7800 Pure hypercholesterolemia, unspecified: Secondary | ICD-10-CM | POA: Diagnosis not present

## 2018-03-06 DIAGNOSIS — Z23 Encounter for immunization: Secondary | ICD-10-CM | POA: Diagnosis not present

## 2018-03-06 DIAGNOSIS — I10 Essential (primary) hypertension: Secondary | ICD-10-CM | POA: Diagnosis not present

## 2018-03-06 DIAGNOSIS — Z0001 Encounter for general adult medical examination with abnormal findings: Secondary | ICD-10-CM | POA: Diagnosis not present

## 2018-03-06 DIAGNOSIS — L570 Actinic keratosis: Secondary | ICD-10-CM | POA: Diagnosis not present

## 2018-03-31 DIAGNOSIS — M5431 Sciatica, right side: Secondary | ICD-10-CM | POA: Diagnosis not present

## 2018-03-31 DIAGNOSIS — M7061 Trochanteric bursitis, right hip: Secondary | ICD-10-CM | POA: Diagnosis not present

## 2018-04-05 DIAGNOSIS — M5431 Sciatica, right side: Secondary | ICD-10-CM | POA: Diagnosis not present

## 2018-04-05 DIAGNOSIS — M7061 Trochanteric bursitis, right hip: Secondary | ICD-10-CM | POA: Diagnosis not present

## 2018-04-22 ENCOUNTER — Ambulatory Visit: Payer: Medicare Other | Admitting: Urology

## 2018-04-22 ENCOUNTER — Other Ambulatory Visit (HOSPITAL_COMMUNITY)
Admission: AD | Admit: 2018-04-22 | Discharge: 2018-04-22 | Disposition: A | Discharge status: Home / Self Care | Payer: Medicare Other | Source: Skilled Nursing Facility | Attending: Urology | Admitting: Urology

## 2018-04-22 DIAGNOSIS — N200 Calculus of kidney: Secondary | ICD-10-CM | POA: Diagnosis not present

## 2018-04-22 DIAGNOSIS — N201 Calculus of ureter: Secondary | ICD-10-CM

## 2018-04-23 ENCOUNTER — Other Ambulatory Visit: Payer: Self-pay | Admitting: Urology

## 2018-04-24 ENCOUNTER — Other Ambulatory Visit: Payer: Self-pay | Admitting: Urology

## 2018-04-24 DIAGNOSIS — N200 Calculus of kidney: Secondary | ICD-10-CM

## 2018-04-24 LAB — URINE CULTURE

## 2018-04-25 ENCOUNTER — Other Ambulatory Visit: Payer: Self-pay | Admitting: Urology

## 2018-04-25 DIAGNOSIS — N200 Calculus of kidney: Secondary | ICD-10-CM

## 2018-05-07 DIAGNOSIS — S93332D Other subluxation of left foot, subsequent encounter: Secondary | ICD-10-CM | POA: Diagnosis not present

## 2018-05-07 DIAGNOSIS — S93331D Other subluxation of right foot, subsequent encounter: Secondary | ICD-10-CM | POA: Diagnosis not present

## 2018-05-07 DIAGNOSIS — M79671 Pain in right foot: Secondary | ICD-10-CM | POA: Diagnosis not present

## 2018-05-07 DIAGNOSIS — M79672 Pain in left foot: Secondary | ICD-10-CM | POA: Diagnosis not present

## 2018-05-29 ENCOUNTER — Encounter (HOSPITAL_COMMUNITY): Admission: RE | Payer: Self-pay | Source: Ambulatory Visit

## 2018-05-29 ENCOUNTER — Ambulatory Visit (HOSPITAL_COMMUNITY): Admission: RE | Admit: 2018-05-29 | Payer: Medicare Other | Source: Ambulatory Visit | Admitting: Urology

## 2018-05-29 ENCOUNTER — Ambulatory Visit (HOSPITAL_COMMUNITY): Payer: Medicare Other

## 2018-05-29 ENCOUNTER — Other Ambulatory Visit (HOSPITAL_COMMUNITY): Payer: Medicare Other

## 2018-05-29 SURGERY — NEPHROLITHOTOMY PERCUTANEOUS
Anesthesia: General | Laterality: Left

## 2018-06-12 DIAGNOSIS — E782 Mixed hyperlipidemia: Secondary | ICD-10-CM | POA: Diagnosis not present

## 2018-06-12 DIAGNOSIS — I10 Essential (primary) hypertension: Secondary | ICD-10-CM | POA: Diagnosis not present

## 2018-06-12 DIAGNOSIS — E1165 Type 2 diabetes mellitus with hyperglycemia: Secondary | ICD-10-CM | POA: Diagnosis not present

## 2018-06-17 ENCOUNTER — Ambulatory Visit (HOSPITAL_COMMUNITY)
Admission: RE | Admit: 2018-06-17 | Discharge: 2018-06-17 | Disposition: A | Discharge status: Home / Self Care | Payer: Medicare Other | Source: Ambulatory Visit | Attending: Urology | Admitting: Urology

## 2018-06-17 DIAGNOSIS — N200 Calculus of kidney: Secondary | ICD-10-CM | POA: Insufficient documentation

## 2018-06-17 DIAGNOSIS — N132 Hydronephrosis with renal and ureteral calculous obstruction: Secondary | ICD-10-CM | POA: Diagnosis not present

## 2018-06-17 DIAGNOSIS — G619 Inflammatory polyneuropathy, unspecified: Secondary | ICD-10-CM | POA: Diagnosis not present

## 2018-06-17 DIAGNOSIS — E1140 Type 2 diabetes mellitus with diabetic neuropathy, unspecified: Secondary | ICD-10-CM | POA: Diagnosis not present

## 2018-06-17 DIAGNOSIS — K635 Polyp of colon: Secondary | ICD-10-CM | POA: Diagnosis not present

## 2018-06-17 DIAGNOSIS — I10 Essential (primary) hypertension: Secondary | ICD-10-CM | POA: Diagnosis not present

## 2018-06-17 DIAGNOSIS — E782 Mixed hyperlipidemia: Secondary | ICD-10-CM | POA: Diagnosis not present

## 2018-07-01 ENCOUNTER — Ambulatory Visit: Payer: Medicare Other | Admitting: Urology

## 2018-07-01 ENCOUNTER — Other Ambulatory Visit (HOSPITAL_COMMUNITY)
Admission: RE | Admit: 2018-07-01 | Discharge: 2018-07-01 | Disposition: A | Discharge status: Home / Self Care | Payer: Medicare Other | Source: Ambulatory Visit | Attending: Urology | Admitting: Urology

## 2018-07-01 DIAGNOSIS — N200 Calculus of kidney: Secondary | ICD-10-CM

## 2018-07-02 LAB — URINE CULTURE: Culture: NO GROWTH

## 2018-07-03 ENCOUNTER — Other Ambulatory Visit: Payer: Self-pay | Admitting: Urology

## 2018-07-31 NOTE — Patient Instructions (Addendum)
Allison Oneill  07/31/2018   Your procedure is scheduled on: 08-07-2018    Report to [facility] Main  Entrance     Report to RADIOLOGY at 800 AM     Call this number if you have problems the morning of surgery [phone]                          Take these medicines the morning of surgery with A SIP OF WATER: clonazepam if needed,  sertraline    DO NOT TAKE ANY DIABETIC MEDICATIONS DAY OF YOUR SURGERY                    How to Manage Your Diabetes Before and After Surgery  TAKE MORNING DOSE OF GLIPIZIDE DAY BEFORE SURGERY AND METFORMIN AS USUAL. DO NOT TAKE METFORMIN AND GLIPIZIDE DAY OF SURGERY.             [place] - Preparing for Surgery Before surgery, you can play an important role.  Because skin is not sterile, your skin needs to be as free of germs as possible.  You can reduce the number of germs on your skin by washing with CHG (chlorahexidine gluconate) soap before surgery.  CHG is an antiseptic cleaner which kills germs and bonds with the skin to continue killing germs even after washing. Please DO NOT use if you have an allergy to CHG or antibacterial soaps.  If your skin becomes reddened/irritated stop using the CHG and inform your nurse when you arrive at Short Stay. Do not shave (including legs and underarms) for at least 48 hours prior to the first CHG shower.  You may shave your face/neck. Please follow these instructions carefully:  1.  Shower with CHG Soap the night before surgery and the  morning of Surgery.  2.  If you choose to wash your hair, wash your hair first as usual with your  normal  shampoo.  3.  After you shampoo, rinse your hair and body thoroughly to remove the  shampoo.                           4.  Use CHG as you would any other liquid soap.  You can apply chg directly  to the skin and wash                       Gently with a scrungie or clean washcloth.  5.  Apply the CHG Soap to your body ONLY FROM THE  NECK DOWN.   Do not use on face/ open                           Wound or open sores. Avoid contact with eyes, ears mouth and genitals (private parts).                       Wash face,  Genitals (private parts) with your normal soap.             6.  Wash thoroughly, paying special attention to the area where your surgery  will be performed.  7.  Thoroughly rinse your body with warm water from the neck down.  8.  DO NOT shower/wash with your normal soap after  using and rinsing off  the CHG Soap.                9.  Pat yourself dry with a clean towel.            10.  Wear clean pajamas.            11.  Place clean sheets on your bed the night of your first shower and do not  sleep with pets. Day of Surgery : Do not apply any lotions/deodorants the morning of surgery.  Please wear clean clothes to the hospital/surgery center.  FAILURE TO FOLLOW THESE INSTRUCTIONS MAY RESULT IN THE CANCELLATION OF YOUR SURGERY PATIENT SIGNATURE_________________________________  NURSE SIGNATURE__________________________________  ________________________________________________________________________  WHAT IS A BLOOD TRANSFUSION? Blood Transfusion Information  A transfusion is the replacement of blood or some of its parts. Blood is made up of multiple cells which provide different functions.  Red blood cells carry oxygen and are used for blood loss replacement.  White blood cells fight against infection.  Platelets control bleeding.  Plasma helps clot blood.  Other blood products are available for specialized needs, such as hemophilia or other clotting disorders. BEFORE THE TRANSFUSION  Who gives blood for transfusions?   Healthy volunteers who are fully evaluated to make sure their blood is safe. This is blood bank blood. Transfusion therapy is the safest it has ever been in the practice of medicine. Before blood is taken from a donor, a complete history is taken to make sure that person has no history  of diseases nor engages in risky social behavior (examples are intravenous drug use or sexual activity with multiple partners). The donor's travel history is screened to minimize risk of transmitting infections, such as malaria. The donated blood is tested for signs of infectious diseases, such as HIV and hepatitis. The blood is then tested to be sure it is compatible with you in order to minimize the chance of a transfusion reaction. If you or a relative donates blood, this is often done in anticipation of surgery and is not appropriate for emergency situations. It takes many days to process the donated blood. RISKS AND COMPLICATIONS Although transfusion therapy is very safe and saves many lives, the main dangers of transfusion include:   Getting an infectious disease.  Developing a transfusion reaction. This is an allergic reaction to something in the blood you were given. Every precaution is taken to prevent this. The decision to have a blood transfusion has been considered carefully by your caregiver before blood is given. Blood is not given unless the benefits outweigh the risks. AFTER THE TRANSFUSION  Right after receiving a blood transfusion, you will usually feel much better and more energetic. This is especially true if your red blood cells have gotten low (anemic). The transfusion raises the level of the red blood cells which carry oxygen, and this usually causes an energy increase.  The nurse administering the transfusion will monitor you carefully for complications. HOME CARE INSTRUCTIONS  No special instructions are needed after a transfusion. You may find your energy is better. Speak with your caregiver about any limitations on activity for underlying diseases you may have. SEEK MEDICAL CARE IF:   Your condition is not improving after your transfusion.  You develop redness or irritation at the intravenous (IV) site. SEEK IMMEDIATE MEDICAL CARE IF:  Any of the following symptoms  occur over the next 12 hours:  Shaking chills.  You have a temperature by mouth above 102 F (  38.9 C), not controlled by medicine.  Chest, back, or muscle pain.  People around you feel you are not acting correctly or are confused.  Shortness of breath or difficulty breathing.  Dizziness and fainting.  You get a rash or develop hives.  You have a decrease in urine output.  Your urine turns a dark color or changes to pink, red, or brown. Any of the following symptoms occur over the next 10 days:  You have a temperature by mouth above 102 F (38.9 C), not controlled by medicine.  Shortness of breath.  Weakness after normal activity.  The white part of the eye turns yellow (jaundice).  You have a decrease in the amount of urine or are urinating less often.  Your urine turns a dark color or changes to pink, red, or brown. Document Released: 05/18/2000 Document Revised: 08/13/2011 Document Reviewed: 01/05/2008 [facility] Patient Information 2014 [place], [place].  _______________________________________________________________________

## 2018-08-01 ENCOUNTER — Encounter (HOSPITAL_COMMUNITY)
Admission: RE | Admit: 2018-08-01 | Discharge: 2018-08-01 | Disposition: A | Discharge status: Home / Self Care | Payer: Medicare Other | Source: Ambulatory Visit | Attending: Urology | Admitting: Urology

## 2018-08-01 ENCOUNTER — Encounter (HOSPITAL_COMMUNITY): Payer: Self-pay | Admitting: *Deleted

## 2018-08-01 ENCOUNTER — Other Ambulatory Visit: Payer: Self-pay

## 2018-08-01 DIAGNOSIS — I10 Essential (primary) hypertension: Secondary | ICD-10-CM | POA: Insufficient documentation

## 2018-08-01 DIAGNOSIS — Z01818 Encounter for other preprocedural examination: Secondary | ICD-10-CM | POA: Diagnosis not present

## 2018-08-01 HISTORY — DX: Polyneuropathy, unspecified: G62.9

## 2018-08-01 HISTORY — DX: Essential (primary) hypertension: I10

## 2018-08-01 HISTORY — DX: Type 2 diabetes mellitus without complications: E11.9

## 2018-08-01 HISTORY — DX: Calculus of kidney: N20.0

## 2018-08-01 LAB — BASIC METABOLIC PANEL
Anion gap: 6 (ref 5–15)
BUN: 20 mg/dL (ref 8–23)
CO2: 28 mmol/L (ref 22–32)
Calcium: 10.8 mg/dL — ABNORMAL HIGH (ref 8.9–10.3)
Chloride: 106 mmol/L (ref 98–111)
Creatinine, Ser: 0.77 mg/dL (ref 0.44–1.00)
GFR calc Af Amer: >60 mL/min (ref >60)
GFR calc non Af Amer: >60 mL/min (ref >60)
GLUCOSE: 149 mg/dL — AB (ref 70–99)
Potassium: 4 mmol/L (ref 3.5–5.1)
Sodium: 140 mmol/L (ref 135–145)

## 2018-08-01 LAB — CBC
HCT: 40.2 % (ref 36.0–46.0)
Hemoglobin: 12.4 g/dL (ref 12.0–15.0)
MCH: 30.8 pg (ref 26.0–34.0)
MCHC: 30.8 g/dL (ref 30.0–36.0)
MCV: 100 fL (ref 80.0–100.0)
Platelets: 232 10*3/uL (ref 150–400)
RBC: 4.02 MIL/uL (ref 3.87–5.11)
RDW: 13.6 % (ref 11.5–15.5)
WBC: 7.2 10*3/uL (ref 4.0–10.5)
nRBC: 0 % (ref 0.0–0.2)

## 2018-08-01 LAB — HEMOGLOBIN A1C
Hgb A1c MFr Bld: 7.2 % — ABNORMAL HIGH (ref 4.8–5.6)
Mean Plasma Glucose: 159.94 mg/dL

## 2018-08-01 LAB — ABO/RH: ABO/RH(D): A POS

## 2018-08-01 LAB — GLUCOSE, CAPILLARY: Glucose-Capillary: 157 mg/dL — ABNORMAL HIGH (ref 70–99)

## 2018-08-06 ENCOUNTER — Other Ambulatory Visit: Payer: Self-pay | Admitting: Radiology

## 2018-08-06 NOTE — H&P (Signed)
H&P  Chief Complaint: Left renal calculus  History of Present Illness: Allison Oneill is a 68 y.o. year old female presents at this time for percutaneous management of a large left renal calculi.  She has a 24 mm left renal pelvic stone as well as a 10 to 11 mm left lower pole stone..  The patient does have a prior history of urolithiasis, having had prior ureteroscopy, stent placement as well as management of bladder calculus.  I have discussed management of these large calculi with the patient, and I have recommended percutaneous management.  Risks and complications of the procedure have been discussed including but not limited to bleeding, infection, renal injury, need for second procedure, anesthetic complication, stent placement and need for percutaneous drain postoperatively.  She understands these and desires to proceed.  Past Medical History:  Diagnosis Date  . Anxiety   . Diabetes mellitus without complication ([place])    TYPE 2  . Fibromyalgia   . Hypertension   . Left renal stone   . Neuromuscular disorder ([facility])    restless leg syndrome  . Neuropathy    FEET    Past Surgical History:  Procedure Laterality Date  . ABDOMINAL HYSTERECTOMY  2000   COMPLETE  . CYSTOSCOPY N/A 08/27/2013   Procedure: CYSTOSCOPY; REMOVAL OF BLADDER CALCULUS;  Surgeon: [name], MD;  Location: [facility];  Service: Urology;  Laterality: N/A;  . CYSTOSCOPY W/ URETERAL STENT PLACEMENT N/A 2012  . CYSTOSCOPY W/ URETERAL STENT REMOVAL N/A 2013  . LITHOTRIPSY  2012  . NEUROMA SURGERY Left 1997    Home Medications:  No medications prior to admission.    Allergies:  Allergies  Allergen Reactions  . Sulfa Antibiotics Itching and Rash    No family history on file.  Social History:  reports that she has never smoked. She has never used smokeless tobacco. She reports that she does not drink alcohol or use drugs.  ROS: A complete review of systems was performed.  All systems are negative  except for pertinent findings as noted.  Physical Exam:  Vital signs in last 24 hours:   General:  Alert and oriented, No acute distress HEENT: Normocephalic, atraumatic Neck: No JVD or lymphadenopathy Cardiovascular: Regular rate and rhythm Lungs: Clear bilaterally Abdomen: Soft, nontender, nondistended, no abdominal masses Back: No CVA tenderness Extremities: No edema Neurologic: Grossly intact  Laboratory Data:  No results found for this or any previous visit (from the past 24 hour(s)). No results found for this or any previous visit (from the past 240 hour(s)). Creatinine: Recent Labs    08/01/18 1017  CREATININE 0.77    Radiologic Imaging: No results found.  Impression/Assessment:  Large left renal calculi, the largest of which is greater than 20 mm in size  Plan:  Left percutaneous nephrolithotomy, possible part 1 of a staged procedure  Lillette Boxer Davari Lopes 08/06/2018, 8:09 PM  Lillette Boxer. Salaam Battershell MD

## 2018-08-07 ENCOUNTER — Other Ambulatory Visit: Payer: Self-pay

## 2018-08-07 ENCOUNTER — Ambulatory Visit (HOSPITAL_COMMUNITY): Payer: Medicare Other | Admitting: Certified Registered Nurse Anesthetist

## 2018-08-07 ENCOUNTER — Ambulatory Visit (HOSPITAL_COMMUNITY): Payer: Medicare Other

## 2018-08-07 ENCOUNTER — Encounter (HOSPITAL_COMMUNITY)
Admission: RE | Disposition: A | Discharge status: Home / Self Care | Payer: Self-pay | Source: Home / Self Care | Attending: Urology

## 2018-08-07 ENCOUNTER — Ambulatory Visit (HOSPITAL_COMMUNITY)
Admission: RE | Admit: 2018-08-07 | Discharge: 2018-08-07 | Disposition: A | Discharge status: Home / Self Care | Payer: Medicare Other | Source: Ambulatory Visit | Attending: Urology | Admitting: Urology

## 2018-08-07 ENCOUNTER — Encounter (HOSPITAL_COMMUNITY): Payer: Self-pay

## 2018-08-07 ENCOUNTER — Ambulatory Visit (HOSPITAL_COMMUNITY): Payer: Medicare Other | Admitting: Physician Assistant

## 2018-08-07 ENCOUNTER — Observation Stay (HOSPITAL_COMMUNITY)
Admission: RE | Admit: 2018-08-07 | Discharge: 2018-08-08 | Disposition: A | Discharge status: Home / Self Care | Payer: Medicare Other | Attending: Urology | Admitting: Urology

## 2018-08-07 DIAGNOSIS — E1140 Type 2 diabetes mellitus with diabetic neuropathy, unspecified: Secondary | ICD-10-CM | POA: Insufficient documentation

## 2018-08-07 DIAGNOSIS — I10 Essential (primary) hypertension: Secondary | ICD-10-CM | POA: Insufficient documentation

## 2018-08-07 DIAGNOSIS — M797 Fibromyalgia: Secondary | ICD-10-CM | POA: Insufficient documentation

## 2018-08-07 DIAGNOSIS — G2581 Restless legs syndrome: Secondary | ICD-10-CM

## 2018-08-07 DIAGNOSIS — Z882 Allergy status to sulfonamides status: Secondary | ICD-10-CM | POA: Insufficient documentation

## 2018-08-07 DIAGNOSIS — Z7984 Long term (current) use of oral hypoglycemic drugs: Secondary | ICD-10-CM | POA: Insufficient documentation

## 2018-08-07 DIAGNOSIS — N200 Calculus of kidney: Secondary | ICD-10-CM

## 2018-08-07 DIAGNOSIS — F419 Anxiety disorder, unspecified: Secondary | ICD-10-CM

## 2018-08-07 DIAGNOSIS — Z87442 Personal history of urinary calculi: Secondary | ICD-10-CM | POA: Insufficient documentation

## 2018-08-07 DIAGNOSIS — Z79899 Other long term (current) drug therapy: Secondary | ICD-10-CM | POA: Insufficient documentation

## 2018-08-07 DIAGNOSIS — N132 Hydronephrosis with renal and ureteral calculous obstruction: Secondary | ICD-10-CM | POA: Diagnosis not present

## 2018-08-07 HISTORY — PX: IR URETERAL STENT LEFT NEW ACCESS W/O SEP NEPHROSTOMY CATH: IMG6075

## 2018-08-07 HISTORY — PX: NEPHROLITHOTOMY: SHX5134

## 2018-08-07 LAB — PROTIME-INR
INR: 0.9 (ref 0.8–1.2)
PROTHROMBIN TIME: 12.4 s (ref 11.4–15.2)

## 2018-08-07 LAB — GLUCOSE, CAPILLARY
Glucose-Capillary: 157 mg/dL — ABNORMAL HIGH (ref 70–99)
Glucose-Capillary: 160 mg/dL — ABNORMAL HIGH (ref 70–99)
Glucose-Capillary: 217 mg/dL — ABNORMAL HIGH (ref 70–99)
Glucose-Capillary: 220 mg/dL — ABNORMAL HIGH (ref 70–99)

## 2018-08-07 LAB — TYPE AND SCREEN
ABO/RH(D): A POS
Antibody Screen: NEGATIVE

## 2018-08-07 SURGERY — NEPHROLITHOTOMY PERCUTANEOUS
Anesthesia: General | Laterality: Left

## 2018-08-07 MED ORDER — SUCCINYLCHOLINE CHLORIDE 200 MG/10ML IV SOSY
PREFILLED_SYRINGE | INTRAVENOUS | Status: AC
  Filled 2018-08-07: qty 10

## 2018-08-07 MED ORDER — HYDROCHLOROTHIAZIDE 25 MG PO TABS
25.0000 mg | ORAL_TABLET | Freq: Every day | ORAL | Status: DC
  Administered 2018-08-08: 25 mg via ORAL
  Filled 2018-08-07: qty 1

## 2018-08-07 MED ORDER — SERTRALINE HCL 100 MG PO TABS
100.0000 mg | ORAL_TABLET | Freq: Every day | ORAL | Status: DC
  Administered 2018-08-08: 100 mg via ORAL
  Filled 2018-08-07: qty 1

## 2018-08-07 MED ORDER — SODIUM CHLORIDE 0.9 % IR SOLN
Status: DC | PRN
  Administered 2018-08-07: 3000 mL
  Administered 2018-08-07: 6000 mL

## 2018-08-07 MED ORDER — ATORVASTATIN CALCIUM 40 MG PO TABS: 40.0000 mg | ORAL_TABLET | Freq: Every day | ORAL | Status: DC

## 2018-08-07 MED ORDER — ONDANSETRON HCL 4 MG/2ML IJ SOLN
INTRAMUSCULAR | Status: AC
  Filled 2018-08-07: qty 2

## 2018-08-07 MED ORDER — OXYBUTYNIN CHLORIDE 5 MG PO TABS: 5.0000 mg | ORAL_TABLET | Freq: Three times a day (TID) | ORAL | Status: DC | PRN

## 2018-08-07 MED ORDER — FENTANYL CITRATE (PF) 100 MCG/2ML IJ SOLN
INTRAMUSCULAR | Status: AC
  Filled 2018-08-07: qty 4

## 2018-08-07 MED ORDER — FENTANYL CITRATE (PF) 100 MCG/2ML IJ SOLN
INTRAMUSCULAR | Status: AC
  Filled 2018-08-07: qty 2

## 2018-08-07 MED ORDER — ROCURONIUM BROMIDE 100 MG/10ML IV SOLN
INTRAVENOUS | Status: AC
  Filled 2018-08-07: qty 1

## 2018-08-07 MED ORDER — ROCURONIUM BROMIDE 50 MG/5ML IV SOSY
PREFILLED_SYRINGE | INTRAVENOUS | Status: DC | PRN
  Administered 2018-08-07: 50 mg via INTRAVENOUS

## 2018-08-07 MED ORDER — LIDOCAINE 2% (20 MG/ML) 5 ML SYRINGE
INTRAMUSCULAR | Status: AC
  Filled 2018-08-07: qty 5

## 2018-08-07 MED ORDER — DEXAMETHASONE SODIUM PHOSPHATE 10 MG/ML IJ SOLN
INTRAMUSCULAR | Status: DC | PRN
  Administered 2018-08-07: 10 mg via INTRAVENOUS

## 2018-08-07 MED ORDER — PROPOFOL 10 MG/ML IV BOLUS
INTRAVENOUS | Status: AC
  Filled 2018-08-07: qty 20

## 2018-08-07 MED ORDER — ONDANSETRON HCL 4 MG/2ML IJ SOLN: 4.0000 mg | Freq: Once | INTRAMUSCULAR | Status: DC | PRN

## 2018-08-07 MED ORDER — ACETAMINOPHEN 325 MG PO TABS: 650.0000 mg | ORAL_TABLET | ORAL | Status: DC | PRN

## 2018-08-07 MED ORDER — INSULIN ASPART 100 UNIT/ML ~~LOC~~ SOLN
0.0000 [IU] | Freq: Three times a day (TID) | SUBCUTANEOUS | Status: DC
  Administered 2018-08-07: 5 [IU] via SUBCUTANEOUS
  Administered 2018-08-08: 3 [IU] via SUBCUTANEOUS

## 2018-08-07 MED ORDER — FENTANYL CITRATE (PF) 100 MCG/2ML IJ SOLN: 25.0000 ug | INTRAMUSCULAR | Status: DC | PRN

## 2018-08-07 MED ORDER — CEFAZOLIN SODIUM-DEXTROSE 2-4 GM/100ML-% IV SOLN: 2.0000 g | INTRAVENOUS | Status: DC

## 2018-08-07 MED ORDER — ACETAMINOPHEN 500 MG PO TABS
1000.0000 mg | ORAL_TABLET | Freq: Once | ORAL | Status: AC
  Administered 2018-08-07: 1000 mg via ORAL
  Filled 2018-08-07: qty 2

## 2018-08-07 MED ORDER — MIDAZOLAM HCL 5 MG/5ML IJ SOLN
INTRAMUSCULAR | Status: DC | PRN
  Administered 2018-08-07: 1 mg via INTRAVENOUS

## 2018-08-07 MED ORDER — LIDOCAINE HCL (PF) 1 % IJ SOLN
INTRAMUSCULAR | Status: AC | PRN
  Administered 2018-08-07 (×2): 5 mL

## 2018-08-07 MED ORDER — EPHEDRINE SULFATE-NACL 50-0.9 MG/10ML-% IV SOSY
PREFILLED_SYRINGE | INTRAVENOUS | Status: DC | PRN
  Administered 2018-08-07: 5 mg via INTRAVENOUS
  Administered 2018-08-07: 10 mg via INTRAVENOUS
  Administered 2018-08-07: 5 mg via INTRAVENOUS

## 2018-08-07 MED ORDER — DEXAMETHASONE SODIUM PHOSPHATE 10 MG/ML IJ SOLN
INTRAMUSCULAR | Status: AC
  Filled 2018-08-07: qty 1

## 2018-08-07 MED ORDER — CLONAZEPAM 1 MG PO TABS
1.0000 mg | ORAL_TABLET | Freq: Every day | ORAL | Status: DC | PRN
  Administered 2018-08-07: 1 mg via ORAL
  Filled 2018-08-07: qty 1

## 2018-08-07 MED ORDER — MIDAZOLAM HCL 2 MG/2ML IJ SOLN
INTRAMUSCULAR | Status: AC
  Filled 2018-08-07: qty 6

## 2018-08-07 MED ORDER — SENNA 8.6 MG PO TABS
1.0000 | ORAL_TABLET | Freq: Two times a day (BID) | ORAL | Status: DC
  Administered 2018-08-08: 8.6 mg via ORAL
  Filled 2018-08-07: qty 1

## 2018-08-07 MED ORDER — SUGAMMADEX SODIUM 200 MG/2ML IV SOLN
INTRAVENOUS | Status: AC
  Filled 2018-08-07: qty 2

## 2018-08-07 MED ORDER — PROPOFOL 10 MG/ML IV BOLUS
INTRAVENOUS | Status: DC | PRN
  Administered 2018-08-07: 150 mg via INTRAVENOUS

## 2018-08-07 MED ORDER — LIDOCAINE 2% (20 MG/ML) 5 ML SYRINGE
INTRAMUSCULAR | Status: DC | PRN
  Administered 2018-08-07: 60 mg via INTRAVENOUS

## 2018-08-07 MED ORDER — LORATADINE 10 MG PO TABS: 10.0000 mg | ORAL_TABLET | Freq: Every day | ORAL | Status: DC | PRN

## 2018-08-07 MED ORDER — FENTANYL CITRATE (PF) 100 MCG/2ML IJ SOLN
INTRAMUSCULAR | Status: DC | PRN
  Administered 2018-08-07 (×3): 50 ug via INTRAVENOUS

## 2018-08-07 MED ORDER — SODIUM CHLORIDE 0.9 % IV SOLN
INTRAVENOUS | Status: DC
  Administered 2018-08-07: 09:00:00 via INTRAVENOUS

## 2018-08-07 MED ORDER — SUGAMMADEX SODIUM 200 MG/2ML IV SOLN
INTRAVENOUS | Status: DC | PRN
  Administered 2018-08-07: 200 mg via INTRAVENOUS

## 2018-08-07 MED ORDER — CEPHALEXIN 500 MG PO CAPS
500.0000 mg | ORAL_CAPSULE | Freq: Two times a day (BID) | ORAL | Status: DC
  Administered 2018-08-07 – 2018-08-08 (×2): 500 mg via ORAL
  Filled 2018-08-07 (×2): qty 1

## 2018-08-07 MED ORDER — KETOROLAC TROMETHAMINE 15 MG/ML IJ SOLN: 15.0000 mg | Freq: Once | INTRAMUSCULAR | Status: DC | PRN

## 2018-08-07 MED ORDER — IOHEXOL 300 MG/ML  SOLN
50.0000 mL | Freq: Once | INTRAMUSCULAR | Status: AC | PRN
  Administered 2018-08-07: 10 mL

## 2018-08-07 MED ORDER — EPHEDRINE 5 MG/ML INJ
INTRAVENOUS | Status: AC
  Filled 2018-08-07: qty 10

## 2018-08-07 MED ORDER — HYDROMORPHONE HCL 1 MG/ML IJ SOLN: 0.5000 mg | INTRAMUSCULAR | Status: DC | PRN

## 2018-08-07 MED ORDER — LIDOCAINE HCL 1 % IJ SOLN
INTRAMUSCULAR | Status: AC
  Filled 2018-08-07: qty 20

## 2018-08-07 MED ORDER — LACTATED RINGERS IV SOLN
INTRAVENOUS | Status: DC
  Administered 2018-08-07 (×2): via INTRAVENOUS

## 2018-08-07 MED ORDER — GABAPENTIN 300 MG PO CAPS
900.0000 mg | ORAL_CAPSULE | Freq: Every day | ORAL | Status: DC
  Administered 2018-08-07: 900 mg via ORAL
  Filled 2018-08-07: qty 3

## 2018-08-07 MED ORDER — FENTANYL CITRATE (PF) 100 MCG/2ML IJ SOLN
INTRAMUSCULAR | Status: AC | PRN
  Administered 2018-08-07 (×2): 50 ug via INTRAVENOUS

## 2018-08-07 MED ORDER — ONDANSETRON HCL 4 MG/2ML IJ SOLN
INTRAMUSCULAR | Status: DC | PRN
  Administered 2018-08-07: 4 mg via INTRAVENOUS

## 2018-08-07 MED ORDER — LISINOPRIL 5 MG PO TABS
5.0000 mg | ORAL_TABLET | Freq: Every day | ORAL | Status: DC
  Administered 2018-08-08: 5 mg via ORAL
  Filled 2018-08-07: qty 1

## 2018-08-07 MED ORDER — MIDAZOLAM HCL 2 MG/2ML IJ SOLN
INTRAMUSCULAR | Status: AC
  Filled 2018-08-07: qty 2

## 2018-08-07 MED ORDER — SODIUM CHLORIDE 0.45 % IV SOLN
INTRAVENOUS | Status: DC
  Administered 2018-08-07 – 2018-08-08 (×2): via INTRAVENOUS

## 2018-08-07 MED ORDER — OXYCODONE HCL 5 MG PO TABS: 5.0000 mg | ORAL_TABLET | ORAL | Status: DC | PRN

## 2018-08-07 MED ORDER — CEFAZOLIN SODIUM-DEXTROSE 2-4 GM/100ML-% IV SOLN
INTRAVENOUS | Status: AC
  Administered 2018-08-07: 2000 mg
  Filled 2018-08-07: qty 100

## 2018-08-07 MED ORDER — MIDAZOLAM HCL 2 MG/2ML IJ SOLN
INTRAMUSCULAR | Status: AC | PRN
  Administered 2018-08-07 (×2): 1 mg via INTRAVENOUS

## 2018-08-07 SURGICAL SUPPLY — 52 items
AGENT HMST KT MTR STRL THRMB (HEMOSTASIS) ×1
APL ESCP 34 STRL LF DISP (HEMOSTASIS) ×1
APL SKNCLS STERI-STRIP NONHPOA (GAUZE/BANDAGES/DRESSINGS) ×2
APPLICATOR SURGIFLO ENDO (HEMOSTASIS) ×2 IMPLANT
BAG URINE DRAINAGE (UROLOGICAL SUPPLIES) IMPLANT
BASKET ZERO TIP NITINOL 2.4FR (BASKET) ×3 IMPLANT
BENZOIN TINCTURE PRP APPL 2/3 (GAUZE/BANDAGES/DRESSINGS) ×6 IMPLANT
BLADE SURG 15 STRL LF DISP TIS (BLADE) ×1 IMPLANT
BLADE SURG 15 STRL SS (BLADE) ×3
BSKT STON RTRVL ZERO TP 2.4FR (BASKET) ×1
CATH FOLEY 2W COUNCIL 20FR 5CC (CATHETERS) IMPLANT
CATH ROBINSON RED A/P 20FR (CATHETERS) IMPLANT
CATH X-FORCE N30 NEPHROSTOMY (TUBING) ×3 IMPLANT
COVER SURGICAL LIGHT HANDLE (MISCELLANEOUS) ×3 IMPLANT
COVER WAND RF STERILE (DRAPES) IMPLANT
DRAPE C-ARM 42X120 X-RAY (DRAPES) ×3 IMPLANT
DRAPE LINGEMAN PERC (DRAPES) ×3 IMPLANT
DRAPE SURG IRRIG POUCH 19X23 (DRAPES) ×3 IMPLANT
DRSG PAD ABDOMINAL 8X10 ST (GAUZE/BANDAGES/DRESSINGS) IMPLANT
DRSG TEGADERM 4X4.75 (GAUZE/BANDAGES/DRESSINGS) ×2 IMPLANT
DRSG TEGADERM 8X12 (GAUZE/BANDAGES/DRESSINGS) ×6 IMPLANT
EXTRACTOR STONE 1.7FRX115CM (UROLOGICAL SUPPLIES) ×3 IMPLANT
FIBER LASER FLEXIVA 1000 (UROLOGICAL SUPPLIES) IMPLANT
FIBER LASER FLEXIVA 365 (UROLOGICAL SUPPLIES) IMPLANT
FIBER LASER FLEXIVA 550 (UROLOGICAL SUPPLIES) IMPLANT
FIBER LASER TRAC TIP (UROLOGICAL SUPPLIES) IMPLANT
GAUZE SPONGE 4X4 12PLY STRL (GAUZE/BANDAGES/DRESSINGS) ×2 IMPLANT
GLOVE BIOGEL M 8.0 STRL (GLOVE) ×3 IMPLANT
GOWN STRL REUS W/TWL XL LVL3 (GOWN DISPOSABLE) ×3 IMPLANT
GUIDEWIRE AMPLAZ .035X145 (WIRE) ×6 IMPLANT
KIT BASIN OR (CUSTOM PROCEDURE TRAY) ×3 IMPLANT
KIT PROBE TRILOGY 3.9X350 (MISCELLANEOUS) ×2 IMPLANT
MANIFOLD NEPTUNE II (INSTRUMENTS) ×3 IMPLANT
NS IRRIG 1000ML POUR BTL (IV SOLUTION) ×3 IMPLANT
PACK CYSTO (CUSTOM PROCEDURE TRAY) ×3 IMPLANT
PROBE LITHOCLAST ULTRA 3.8X403 (UROLOGICAL SUPPLIES) IMPLANT
PROBE PNEUMATIC 1.0MMX570MM (UROLOGICAL SUPPLIES) IMPLANT
SET IRRIG Y TYPE TUR BLADDER L (SET/KITS/TRAYS/PACK) IMPLANT
SHEATH PEELAWAY SET 9 (SHEATH) ×3 IMPLANT
SPONGE LAP 4X18 RFD (DISPOSABLE) ×3 IMPLANT
STENT URET 6FRX24 CONTOUR (STENTS) ×2 IMPLANT
SURGIFLO W/THROMBIN 8M KIT (HEMOSTASIS) ×2 IMPLANT
SUT SILK 2 0 30  PSL (SUTURE) ×2
SUT SILK 2 0 30 PSL (SUTURE) ×1 IMPLANT
SYR 10ML LL (SYRINGE) ×3 IMPLANT
SYR 20CC LL (SYRINGE) ×6 IMPLANT
TRAY FOLEY MTR SLVR 14FR STAT (SET/KITS/TRAYS/PACK) IMPLANT
TRAY FOLEY MTR SLVR 16FR STAT (SET/KITS/TRAYS/PACK) ×3 IMPLANT
TUBING CONNECTING 10 (TUBING) ×4 IMPLANT
TUBING CONNECTING 10' (TUBING) ×2
TUBING UROLOGY SET (TUBING) ×3 IMPLANT
WATER STERILE IRR 1000ML POUR (IV SOLUTION) ×3 IMPLANT

## 2018-08-07 NOTE — Anesthesia Postprocedure Evaluation (Signed)
Anesthesia Post Note  Patient: Allison Oneill  Procedure(s) Performed: NEPHROLITHOTOMY PERCUTANEOUS (Left )     Patient location during evaluation: PACU Anesthesia Type: General Level of consciousness: awake and alert Pain management: pain level controlled Vital Signs Assessment: post-procedure vital signs reviewed and stable Respiratory status: spontaneous breathing, nonlabored ventilation, respiratory function stable and patient connected to nasal cannula oxygen Cardiovascular status: blood pressure returned to baseline and stable Postop Assessment: no apparent nausea or vomiting Anesthetic complications: no    Last Vitals:  Vitals:   08/07/18 1515 08/07/18 1545  BP: (!) 168/67 (!) 171/69  Pulse: 95 99  Resp: 15 14  Temp: 36.6 C 36.7 C  SpO2: 94% 91%    Last Pain:  Vitals:   08/07/18 1551  TempSrc:   PainSc: 0-No pain                 Gurfateh Mcclain P Jens Siems

## 2018-08-07 NOTE — Procedures (Signed)
Interventional Radiology Procedure:   Indications: Left renal calculi and scheduled for nephrolithotomy  Procedure: Left nephroureteral catheter placement  Findings: Large renal pelvic stone and small stones in lower pole.  Hydronephrosis in upper pole calyx.  Access obtained from upper/mid calyx. 5 Fr catheter extends into bladder.  Complications: None     EBL: Minmal  Plan: To OR for Urology procedure.     Allison Oneill R. Anselm Pancoast, MD  Pager: [phone]

## 2018-08-07 NOTE — Transfer of Care (Signed)
Immediate Anesthesia Transfer of Care Note  Patient: Allison Oneill  Procedure(s) Performed: NEPHROLITHOTOMY PERCUTANEOUS (Left )  Patient Location: PACU  Anesthesia Type:General  Level of Consciousness: awake, alert  and oriented  Airway & Oxygen Therapy: Patient Spontanous Breathing and Patient connected to face mask oxygen  Post-op Assessment: Report given to RN and Post -op Vital signs reviewed and stable  Post vital signs: Reviewed and stable  Last Vitals:  Vitals Value Taken Time  BP 163/74 08/07/2018  2:19 PM  Temp    Pulse 96 08/07/2018  2:23 PM  Resp 19 08/07/2018  2:23 PM  SpO2 94 % 08/07/2018  2:23 PM  Vitals shown include unvalidated device data.  Last Pain:  Vitals:   08/07/18 0837  PainSc: 0-No pain         Complications: No apparent anesthesia complications

## 2018-08-07 NOTE — Interval H&P Note (Signed)
History and Physical Interval Note:  08/07/2018 12:12 PM  Allison Oneill  has presented today for surgery, with the diagnosis of LEFT RENAL STONE  The various methods of treatment have been discussed with the patient and family. After consideration of risks, benefits and other options for treatment, the patient has consented to  Procedure(s): NEPHROLITHOTOMY PERCUTANEOUS (Left) HOLMIUM LASER APPLICATION (Left) as a surgical intervention .  The patient's history has been reviewed, patient examined, no change in status, stable for surgery.  I have reviewed the patient's chart and labs.  Questions were answered to the patient's satisfaction.     Lillette Boxer Norvella Loscalzo

## 2018-08-07 NOTE — Anesthesia Preprocedure Evaluation (Addendum)
Anesthesia Evaluation  Patient identified by MRN, date of birth, ID band Patient awake    Reviewed: Allergy & Precautions, NPO status , Patient's Chart, lab work & pertinent test results  Airway Mallampati: III  TM Distance: >3 FB Neck ROM: Full    Dental no notable dental hx.    Pulmonary neg pulmonary ROS,    Pulmonary exam normal breath sounds clear to auscultation       Cardiovascular hypertension, Pt. on medications Normal cardiovascular exam Rhythm:Regular Rate:Normal  ECG: rate 67. Normal sinus rhythm Right bundle branch block   Neuro/Psych PSYCHIATRIC DISORDERS Anxiety negative neurological ROS     GI/Hepatic negative GI ROS, Neg liver ROS,   Endo/Other  diabetes, Oral Hypoglycemic Agents  Renal/GU negative Renal ROS     Musculoskeletal  (+) Fibromyalgia -Fibromyalgia Restless leg syndrome   Abdominal   Peds  Hematology HLD   Anesthesia Other Findings LEFT RENAL STONE  Reproductive/Obstetrics                            Anesthesia Physical Anesthesia Plan  ASA: III  Anesthesia Plan: General   Post-op Pain Management:    Induction: Intravenous  PONV Risk Score and Plan: 3 and Ondansetron, Dexamethasone and Treatment may vary due to age or medical condition  Airway Management Planned: Oral ETT  Additional Equipment:   Intra-op Plan:   Post-operative Plan: Extubation in OR  Informed Consent: I have reviewed the patients History and Physical, chart, labs and discussed the procedure including the risks, benefits and alternatives for the proposed anesthesia with the patient or authorized representative who has indicated his/her understanding and acceptance.     Dental advisory given  Plan Discussed with: CRNA  Anesthesia Plan Comments:        Anesthesia Quick Evaluation

## 2018-08-07 NOTE — H&P (Signed)
Chief Complaint: Patient was seen in consultation today for left renal stone.  Referring Physician(s): Catalina Foothills  Supervising Physician: Markus Daft  Patient Status: [facility] - Out-pt  History of Present Illness: Allison Oneill is a 68 y.o. 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 She has a history of nephrolithiasis that is managed by Dr. Diona Fanti. She has tentative plans for percutaneous lithotripsy in OR today with Dr. Diona Fanti.  IR requested by Dr. Diona Fanti for possible image-guided percutaneous left nephroureteral stent placement for access prior to OR. Patient awake and alert laying in bed with no complaints at this time. Denies fever, chills, chest pain, dyspnea, abdominal pain, or headache.   Past Medical History:  Diagnosis Date  . Anxiety   . Diabetes mellitus without complication ([place])    TYPE 2  . Fibromyalgia   . Hypertension   . Left renal stone   . Neuromuscular disorder ([facility])    restless leg syndrome  . Neuropathy    FEET    Past Surgical History:  Procedure Laterality Date  . ABDOMINAL HYSTERECTOMY  2000   COMPLETE  . CYSTOSCOPY N/A 08/27/2013   Procedure: CYSTOSCOPY; REMOVAL OF BLADDER CALCULUS;  Surgeon: [name], MD;  Location: [facility];  Service: Urology;  Laterality: N/A;  . CYSTOSCOPY W/ URETERAL STENT PLACEMENT N/A 2012  . CYSTOSCOPY W/ URETERAL STENT REMOVAL N/A 2013  . LITHOTRIPSY  2012  . NEUROMA SURGERY Left 1997    Allergies: Sulfa antibiotics  Medications: Prior to Admission medications   Medication Sig Start Date End Date Taking? Authorizing Provider  clonazePAM (KLONOPIN) 1 MG tablet Take 1 mg by mouth daily as needed for anxiety (panic attacks).    [provider]  fexofenadine (ALLEGRA) 180 MG tablet Take 180 mg by mouth at bedtime.     [provider]  gabapentin  (NEURONTIN) 300 MG capsule Take 900 mg by mouth at bedtime.     [provider]  glipiZIDE (GLUCOTROL XL) 10 MG 24 hr tablet Take 10 mg by mouth every morning. 06/08/18   [provider]  hydrochlorothiazide (HYDRODIURIL) 25 MG tablet Take 25 mg by mouth daily.    [provider]  lisinopril (PRINIVIL,ZESTRIL) 5 MG tablet Take 5 mg by mouth every morning. 06/08/18   [provider]  metFORMIN (GLUCOPHAGE-XR) 500 MG 24 hr tablet Take 500 mg by mouth at bedtime. 06/08/18   [provider]  sertraline (ZOLOFT) 100 MG tablet Take 100 mg by mouth daily.    [provider]  simvastatin (ZOCOR) 80 MG tablet Take 80 mg by mouth every evening. 06/08/18   [provider]     No family history on file.  Social History   Socioeconomic History  . Marital status: Married    Spouse name: Not on file  . Number of children: Not on file  . Years of education: Not on file  . Highest education level: Not on file  Occupational History  . Not on file  Social Needs  . Financial resource strain: Not on file  . Food insecurity:    Worry: Not on file    Inability: Not on file  . Transportation needs:    Medical: Not on file    Non-medical: Not on file  Tobacco Use  . Smoking status: Never Smoker  . Smokeless tobacco: Never Used  Substance and Sexual Activity  . Alcohol use: No  .  Drug use: No  . Sexual activity: Not on file  Lifestyle  . Physical activity:    Days per week: Not on file    Minutes per session: Not on file  . Stress: Not on file  Relationships  . Social connections:    Talks on phone: Not on file    Gets together: Not on file    Attends religious service: Not on file    Active member of club or organization: Not on file    Attends meetings of clubs or organizations: Not on file    Relationship status: Not on file  Other Topics Concern  . Not on file  Social History Narrative  . Not on file     Review of Systems: A 12  point ROS discussed and pertinent positives are indicated in the HPI above.  All other systems are negative.  Review of Systems  Constitutional: Negative for chills and fever.  Respiratory: Negative for shortness of breath and wheezing.   Cardiovascular: Negative for chest pain and palpitations.  Gastrointestinal: Negative for abdominal pain.  Neurological: Negative for dizziness and headaches.  Psychiatric/Behavioral: Negative for behavioral problems and confusion.    Vital Signs: BP (!) 151/80 (BP Location: Right Arm)   Pulse 76   Temp 98.5 F (36.9 C) (Oral)   Resp 18   SpO2 93%   Physical Exam Vitals signs and nursing note reviewed.  Constitutional:      General: She is not in acute distress.    Appearance: Normal appearance.  Cardiovascular:     Rate and Rhythm: Normal rate and regular rhythm.     Heart sounds: Normal heart sounds. No murmur.  Pulmonary:     Effort: Pulmonary effort is normal. No respiratory distress.     Breath sounds: Normal breath sounds. No wheezing.  Skin:    General: Skin is warm and dry.  Neurological:     Mental Status: She is alert and oriented to person, place, and time.  Psychiatric:        Mood and Affect: Mood normal.        Behavior: Behavior normal.        Thought Content: Thought content normal.        Judgment: Judgment normal.      MD Evaluation Airway: WNL Heart: WNL Abdomen: WNL Chest/ Lungs: WNL ASA  Classification: 2 Mallampati/Airway Score: Two   Labs:  CBC: Recent Labs    08/01/18 1017  WBC 7.2  HGB 12.4  HCT 40.2  PLT 232    COAGS: No results for input(s): INR, APTT in the last 8760 hours.  BMP: Recent Labs    08/01/18 1017  NA 140  K 4.0  CL 106  CO2 28  GLUCOSE 149*  BUN 20  CALCIUM 10.8*  CREATININE 0.77  GFRNONAA >60  GFRAA >60     Assessment and Plan:  Left renal stone. Plan for image-guided percutaneous left nephroureteral stent placement tentative for today with Dr.  Anselm Pancoast. Patient will be NPO. Afebrile and WBCs WNL. She does not take blood thinners. INR pending.  Risks and benefits discussed with the patient including, but not limited to bleeding, infection, sepsis or even death. All of the patient's questions were answered, patient is agreeable to proceed. Consent signed and in chart.  Thank you for this interesting consult.  I greatly enjoyed meeting Allison Oneill and look forward to participating in their care.  A copy of this report was sent to the requesting  provider on this date.  Electronically Signed: Earley Abide, PA-C 08/07/2018, 8:34 AM   I spent a total of 30 Minutes in face to face in clinical consultation, greater than 50% of which was counseling/coordinating care for left renal stone.

## 2018-08-07 NOTE — Discharge Instructions (Signed)

## 2018-08-07 NOTE — Op Note (Signed)
Preoperative diagnosis: Large left renal calculus (23 mm)  Postoperative diagnosis: Same  Procedure: Left percutaneous nephrolithotomy, placement of 6 French by 24 cm left contour double-J stent without tether  Surgeon: Caitriona Sundquist  Anesthesia: General endotracheal  Complications: None  Specimen: Stone fragments  Estimated blood loss: Less than 100 mL  Drains: 42 French Foley catheter and bladder, previously mentioned stent in left ureter  Indications: 68 year old female with a large left renal pelvic stone, approximately 20 to 23 mm in size.  Additionally, she has an 11 mm left lower pole stone.  She presents at this time for percutaneous management of these structures.  I discussed the procedure, risks and complications with the patient.  Additionally, alternative treatments including lithotripsy and ureteroscopy were discussed.  Because of the size of the stones, it was recommended that we proceed with percutaneous management.  She desires to proceed.  Description of procedure: The patient was properly identified and marked in the holding area.  She received preoperative IV antibiotics.  She was taken to the operating room where general anesthetic was administered.  Her bladder was cannulated with a 16 French Foley catheter.  She was then placed in the prone position.  All extremities and pressure points were appropriately padded.  The left flank was prepped and draped around the previously placed left percutaneous access catheter.  Proper timeout was performed.  I then passed a Super Stiff floppy tip guidewire through the indwelling Kumpe catheter, and this was guided into the bladder using fluoroscopy.  The Kumpe catheter was then removed over top of the guidewire.  Incision was then made adjacent to the guidewire, approximately 12 mm in size.  Subcutaneous tissue was dissected with a hemostat.  I then passed the 12 French peel-away sheath over top of the guidewire, down distally into the  ureter using fluoroscopic guidance.  The trocar was then removed, and another Super Stiff guidewire was negotiated in the bladder fluoroscopically.  The peel-away sheath was removed.  I then, over the working guidewire, passed the NephroMax balloon adjacent to the large renal pelvic stone.  It was then inflated to 18 atm of pressure and held in place for approximately 2 minutes.  I then negotiated the nephrostomy access sheath over top of the balloon, fluoroscopically right up to the stone.  The balloon was deflated and then removed.  I then passed the rigid nephroscope into the renal pelvis.  The stone was easily encountered.  I used the trilogy lithotrite to harmonically fragment and aspirate the stone.  This was quite easy as the stone was not very dense.  A few smaller fragments were resulted after the aspiration.  These were picked out with the grasper.  Additionally, the scope was advanced into the lower pole calyx were multiple smaller stones were identified and then removed.  I then replaced the lithotrite and use the suction/harmonic device to remove all other stone material that I could see, including some stone that was at the UPJ.  Once all stone was gone, I removed the rigid scope and replaced that with the flexible scope.  The flexible scope was advanced into the upper pole calyceal system where a few fragmented stones had migrated.  These were rinsed into the pelvis.  No further stones were seen in this area.  I then replaced the rigid scope and suction these smaller fragments out.  A couple of fragments also moved in the lower pole which were then removed.  At this point, all stone material had been  removed, and there were no stone fragments seen either with the flexible or rigid scope.  I then backloaded the guidewire through the rigid scope and placed th 24 cm, 6 French contour double-J stent.  Fluoroscopy was used to guide this into the bladder.  I then remove the guidewire to adequately deployed  this stent with excellent distal and proximal curl seen.  At this point, I then measured the distance between the lateral edge of the mid calyx and the skin.  I used FloSeal to seal this area off as I backed the access sheath out.  The safety wire was then removed.  I then sutured the skin with 2-0 silk placed in a vertical mattress fashion.  Dry sterile dressings were then placed.  At this point, the procedure was terminated.  The patient was awakened in the supine position, extubated, and taken to the PACU in stable condition.  She tolerated the procedure well.  Sponge needle instrument counts were correct x2.

## 2018-08-07 NOTE — Anesthesia Procedure Notes (Signed)
Procedure Name: Intubation Date/Time: 08/07/2018 12:42 PM Performed by: Maxwell Caul, CRNA Pre-anesthesia Checklist: Patient identified, Emergency Drugs available, Suction available and Patient being monitored Patient Re-evaluated:Patient Re-evaluated prior to induction Oxygen Delivery Method: Circle system utilized Preoxygenation: Pre-oxygenation with 100% oxygen Induction Type: IV induction Ventilation: Mask ventilation without difficulty Laryngoscope Size: Mac and 4 Grade View: Grade I Tube type: Oral Tube size: 7.5 mm Number of attempts: 1 Airway Equipment and Method: Stylet Placement Confirmation: ETT inserted through vocal cords under direct vision,  positive ETCO2 and breath sounds checked- equal and bilateral Secured at: 21 cm Tube secured with: Tape Dental Injury: Teeth and Oropharynx as per pre-operative assessment

## 2018-08-08 ENCOUNTER — Encounter (HOSPITAL_COMMUNITY): Payer: Self-pay | Admitting: Urology

## 2018-08-08 DIAGNOSIS — Z87442 Personal history of urinary calculi: Secondary | ICD-10-CM | POA: Diagnosis not present

## 2018-08-08 DIAGNOSIS — I10 Essential (primary) hypertension: Secondary | ICD-10-CM | POA: Diagnosis not present

## 2018-08-08 DIAGNOSIS — M797 Fibromyalgia: Secondary | ICD-10-CM | POA: Diagnosis not present

## 2018-08-08 DIAGNOSIS — N132 Hydronephrosis with renal and ureteral calculous obstruction: Secondary | ICD-10-CM | POA: Diagnosis not present

## 2018-08-08 DIAGNOSIS — Z79899 Other long term (current) drug therapy: Secondary | ICD-10-CM | POA: Diagnosis not present

## 2018-08-08 DIAGNOSIS — Z7984 Long term (current) use of oral hypoglycemic drugs: Secondary | ICD-10-CM | POA: Diagnosis not present

## 2018-08-08 DIAGNOSIS — E1140 Type 2 diabetes mellitus with diabetic neuropathy, unspecified: Secondary | ICD-10-CM | POA: Diagnosis not present

## 2018-08-08 DIAGNOSIS — G2581 Restless legs syndrome: Secondary | ICD-10-CM | POA: Diagnosis not present

## 2018-08-08 DIAGNOSIS — Z882 Allergy status to sulfonamides status: Secondary | ICD-10-CM | POA: Diagnosis not present

## 2018-08-08 LAB — GLUCOSE, CAPILLARY
Glucose-Capillary: 160 mg/dL — ABNORMAL HIGH (ref 70–99)
Glucose-Capillary: 169 mg/dL — ABNORMAL HIGH (ref 70–99)
Glucose-Capillary: 240 mg/dL — ABNORMAL HIGH (ref 70–99)

## 2018-08-08 LAB — HEMOGLOBIN AND HEMATOCRIT, BLOOD
HCT: 35.2 % — ABNORMAL LOW (ref 36.0–46.0)
Hemoglobin: 11 g/dL — ABNORMAL LOW (ref 12.0–15.0)

## 2018-08-08 LAB — HIV ANTIBODY (ROUTINE TESTING W REFLEX): HIV Screen 4th Generation wRfx: NONREACTIVE

## 2018-08-08 MED ORDER — MENTHOL 3 MG MT LOZG
1.0000 | LOZENGE | OROMUCOSAL | Status: DC | PRN
  Filled 2018-08-08: qty 9

## 2018-08-08 MED ORDER — CEPHALEXIN 250 MG PO CAPS: 250.0000 mg | ORAL_CAPSULE | Freq: Two times a day (BID) | ORAL | 0 refills | Status: DC

## 2018-08-08 MED ORDER — OXYBUTYNIN CHLORIDE 5 MG PO TABS: 5.0000 mg | ORAL_TABLET | Freq: Three times a day (TID) | ORAL | 1 refills | Status: DC | PRN

## 2018-08-08 MED ORDER — OXYCODONE HCL 5 MG PO TABS: 5.0000 mg | ORAL_TABLET | ORAL | 0 refills | Status: DC | PRN

## 2018-08-08 NOTE — Plan of Care (Addendum)
Pt denies pain. Pt's HR got as high as 119, w/ BP of 151/78 @ 2023. 0.5 Mg of Klonopin was administered.  VS @ 7425 BP 150/66 HR 113. On call notified. Advised to continue to monitor and call back if VS became more unstable.

## 2018-08-12 NOTE — Discharge Summary (Signed)
Patient ID: Allison Oneill MRN: [id number] DOB/AGE: 1950/09/24 68 y.o.  Admit date: 08/07/2018 Discharge date: 08/12/2018  Primary Care Physician:  Curlene Labrum, MD  Discharge Diagnoses:  N20.0   Consults:  None   Discharge Medications: Allergies as of 08/08/2018      Reactions   Sulfa Antibiotics Itching, Rash      Medication List    TAKE these medications   cephALEXin 250 MG capsule Commonly known as:  Keflex Take 1 capsule (250 mg total) by mouth 2 (two) times daily.   clonazePAM 1 MG tablet Commonly known as:  KLONOPIN Take 1 mg by mouth daily as needed for anxiety (panic attacks).   fexofenadine 180 MG tablet Commonly known as:  ALLEGRA Take 180 mg by mouth at bedtime.   gabapentin 300 MG capsule Commonly known as:  NEURONTIN Take 900 mg by mouth at bedtime.   glipiZIDE 10 MG 24 hr tablet Commonly known as:  GLUCOTROL XL Take 10 mg by mouth every morning.   hydrochlorothiazide 25 MG tablet Commonly known as:  HYDRODIURIL Take 25 mg by mouth daily.   lisinopril 5 MG tablet Commonly known as:  PRINIVIL,ZESTRIL Take 5 mg by mouth every morning.   metFORMIN 500 MG 24 hr tablet Commonly known as:  GLUCOPHAGE-XR Take 500 mg by mouth at bedtime.   oxybutynin 5 MG tablet Commonly known as:  DITROPAN Take 1 tablet (5 mg total) by mouth every 8 (eight) hours as needed for bladder spasms.   oxyCODONE 5 MG immediate release tablet Commonly known as:  Roxicodone Take 1 tablet (5 mg total) by mouth every 4 (four) hours as needed for severe pain.   sertraline 100 MG tablet Commonly known as:  ZOLOFT Take 100 mg by mouth daily.   simvastatin 80 MG tablet Commonly known as:  ZOCOR Take 80 mg by mouth every evening.        Significant Diagnostic Studies:  Dg C-arm 1-60 Min-no Report  Result Date: 08/07/2018 Fluoroscopy was utilized by the requesting physician.  No radiographic interpretation.   Ir Ureteral Stent Left New Access W/o Sep Nephrostomy  Cath  Result Date: 08/07/2018 INDICATION: 68 year old with left renal calculi. Patient scheduled for operative nephrolithotomy procedure. Patient needs percutaneous access for the operative procedure. EXAM: PLACEMENT OF LEFT NEPHROURETERAL CATHETER WITH ULTRASOUND AND FLUOROSCOPIC GUIDANCE COMPARISON:  None. MEDICATIONS: Ancef 2 g; The antibiotic was administered in an appropriate time frame prior to skin puncture. ANESTHESIA/SEDATION: Fentanyl 100 mcg IV; Versed 2.0 mg IV Moderate Sedation Time:  30 minutes The patient was continuously monitored during the procedure by the interventional radiology nurse under my direct supervision. CONTRAST:  10 mL Omnipaque 300-administered into the collecting system(s) FLUOROSCOPY TIME:  Fluoroscopy Time: 6 minutes 24 seconds (170 mGy). COMPLICATIONS: None immediate. PROCEDURE: Informed written consent was obtained from the patient after a thorough discussion of the procedural risks, benefits and alternatives. All questions were addressed. Maximal Sterile Barrier Technique was utilized including caps, mask, sterile gowns, sterile gloves, sterile drape, hand hygiene and skin antiseptic. A timeout was performed prior to the initiation of the procedure. Left flank was prepped and draped in sterile fashion. Fluoroscopic image of the left abdomen was obtained. Left renal calculi were identified. Ultrasound demonstrated moderate hydronephrosis in the upper pole calyx. No significant dilatation in the mid and lower pole calices. Skin was anesthetized with 1% lidocaine. Using ultrasound guidance, a mid pole infundibulum was targeted with ultrasound. 21 gauge needle was successfully advanced into the collecting system. Collecting  system was successfully opacified with contrast. A mid/upper pole calyx was targeted for percutaneous access. A 22 gauge Chiba needle was directed into the mid/upper pole calyx with fluoroscopic guidance. 0.018 wire was advanced into the renal collecting system  and a transitional dilator set was placed. Bentson wire was placed and a 5 [place] Kumpe catheter was advanced into the collecting system. Catheter was advanced beyond the renal pelvic stone and advanced into the ureter using a Glidewire. Catheter was successfully advanced into the urinary bladder. Catheter was sutured to the skin. Bandage was placed over the catheter. Fluoroscopic and ultrasound images were taken and saved for documentation. FINDINGS: Large stone in the renal pelvis. Multiple small stones in the lower pole. Access obtained from a mid/upper pole calyx. Catheter was successfully advanced to the urinary bladder. IMPRESSION: Successful placement of a left nephroureteral catheter with ultrasound and fluoroscopic guidance. Percutaneous access in a mid/upper pole calyx. Electronically Signed   By: Markus Daft M.D.   On: 08/07/2018 10:23    Brief H and P: For complete details please refer to admission H and P, but in brief pt admitted for percutaneous mgmt of a large left renal stone  Hospital Course: No postop proelems, pt discharged POD 1 Active Problems:   Kidney stone   Day of Discharge BP 127/61 (BP Location: Right Arm)   Pulse 99   Temp 99 F (37.2 C) (Oral)   Resp 18   Ht 5\' 6"  (1.676 m)   Wt 76.2 kg   SpO2 92%   BMI 27.12 kg/m   No results found for this or any previous visit (from the past 24 hour(s)).  Physical Exam: General: Alert and awake oriented x3 not in any acute distress. HEENT: anicteric sclera, pupils reactive to light and accommodation CVS: S1-S2 clear no murmur rubs or gallops Chest: clear to auscultation bilaterally, no wheezing rales or rhonchi Abdomen: soft nontender, nondistended, normal bowel sounds, no organomegaly Extremities: no cyanosis, clubbing or edema noted bilaterally Neuro: Cranial nerves II-XII intact, no focal neurological deficits  Disposition:  Home  Diet:  Regular  Activity:  Gradually increase   Disposition and  Follow-up:  Discharge Instructions    Diet general   Complete by:  As directed    Increase activity slowly   Complete by:  As directed          TESTS THAT NEED FOLLOW-UP   N/A  DISCHARGE FOLLOW-UP    Time spent on Discharge:   10 mins  Signed: Lillette Boxer Jerelene Salaam 08/12/2018, 11:57 AM

## 2018-08-19 DIAGNOSIS — J069 Acute upper respiratory infection, unspecified: Secondary | ICD-10-CM | POA: Diagnosis not present

## 2018-08-19 DIAGNOSIS — R509 Fever, unspecified: Secondary | ICD-10-CM | POA: Diagnosis not present

## 2018-08-26 ENCOUNTER — Other Ambulatory Visit: Payer: Self-pay

## 2018-08-26 ENCOUNTER — Ambulatory Visit (INDEPENDENT_AMBULATORY_CARE_PROVIDER_SITE_OTHER): Payer: Medicare Other | Admitting: Urology

## 2018-08-26 DIAGNOSIS — N200 Calculus of kidney: Secondary | ICD-10-CM | POA: Diagnosis not present

## 2018-09-10 ENCOUNTER — Other Ambulatory Visit: Payer: Self-pay | Admitting: Urology

## 2018-09-10 DIAGNOSIS — N200 Calculus of kidney: Secondary | ICD-10-CM

## 2018-09-17 ENCOUNTER — Other Ambulatory Visit: Payer: Self-pay

## 2018-09-17 NOTE — Patient Outreach (Signed)
[facility]) Care Management  09/17/2018  Allison Oneill [birth year] [id number]   Medication Adherence call to [name] Compliant Voice message left with a call back number. Mrs, [name] is showing past due under [place].   [facility] Management Direct Dial [phone]  Fax [phone] [email or web address]

## 2018-09-18 DIAGNOSIS — I10 Essential (primary) hypertension: Secondary | ICD-10-CM | POA: Diagnosis not present

## 2018-09-18 DIAGNOSIS — E1165 Type 2 diabetes mellitus with hyperglycemia: Secondary | ICD-10-CM | POA: Diagnosis not present

## 2018-09-18 DIAGNOSIS — E782 Mixed hyperlipidemia: Secondary | ICD-10-CM | POA: Diagnosis not present

## 2018-09-18 DIAGNOSIS — R5383 Other fatigue: Secondary | ICD-10-CM | POA: Diagnosis not present

## 2018-09-22 DIAGNOSIS — E782 Mixed hyperlipidemia: Secondary | ICD-10-CM | POA: Diagnosis not present

## 2018-09-22 DIAGNOSIS — I10 Essential (primary) hypertension: Secondary | ICD-10-CM | POA: Diagnosis not present

## 2018-09-22 DIAGNOSIS — E1140 Type 2 diabetes mellitus with diabetic neuropathy, unspecified: Secondary | ICD-10-CM | POA: Diagnosis not present

## 2018-09-30 ENCOUNTER — Encounter: Payer: Self-pay | Admitting: "Endocrinology

## 2018-10-10 ENCOUNTER — Other Ambulatory Visit: Payer: Self-pay

## 2018-10-10 ENCOUNTER — Ambulatory Visit (HOSPITAL_COMMUNITY)
Admission: RE | Admit: 2018-10-10 | Discharge: 2018-10-10 | Disposition: A | Discharge status: Home / Self Care | Payer: Medicare Other | Source: Ambulatory Visit | Attending: Urology | Admitting: Urology

## 2018-10-10 DIAGNOSIS — N200 Calculus of kidney: Secondary | ICD-10-CM

## 2018-10-10 NOTE — Patient Outreach (Signed)
[name]) Care Management  10/10/2018  Allison Oneill [birth year] [id number]   Medication Adherence call to [name] Compliant Voice message left with a call back number. [name] is showing past due on Glipizide Er 10 mg and Metformin Er 500 mg under [name].   [facility] Management Direct Dial [phone]  Fax [phone] [name]

## 2018-10-15 DIAGNOSIS — L609 Nail disorder, unspecified: Secondary | ICD-10-CM | POA: Diagnosis not present

## 2018-10-15 DIAGNOSIS — L110 Acquired keratosis follicularis: Secondary | ICD-10-CM | POA: Diagnosis not present

## 2018-10-15 DIAGNOSIS — I739 Peripheral vascular disease, unspecified: Secondary | ICD-10-CM | POA: Diagnosis not present

## 2018-10-21 ENCOUNTER — Ambulatory Visit (INDEPENDENT_AMBULATORY_CARE_PROVIDER_SITE_OTHER): Payer: Medicare Other | Admitting: Urology

## 2018-10-21 DIAGNOSIS — N200 Calculus of kidney: Secondary | ICD-10-CM

## 2018-11-17 ENCOUNTER — Other Ambulatory Visit: Payer: Self-pay

## 2018-11-17 ENCOUNTER — Ambulatory Visit (INDEPENDENT_AMBULATORY_CARE_PROVIDER_SITE_OTHER): Payer: Medicare Other | Admitting: "Endocrinology

## 2018-11-17 ENCOUNTER — Encounter: Payer: Self-pay | Admitting: "Endocrinology

## 2018-11-17 NOTE — Progress Notes (Signed)
Consult Note       11/17/2018, 4:15 PM  Allison Oneill is a 68 y.o.-year-old female, referred by her PMD   Burdine, Virgina Evener, MD  , for evaluation for hypercalcemia/hyperparathyroidism.   Past Medical History:  Diagnosis Date  . Anxiety   . Diabetes mellitus without complication ([place])    TYPE 2  . Fibromyalgia   . Hypertension   . Left renal stone   . Neuromuscular disorder (HCC)    restless leg syndrome  . Neuropathy    FEET    Past Surgical History:  Procedure Laterality Date  . ABDOMINAL HYSTERECTOMY  2000   COMPLETE  . CYSTOSCOPY N/A 08/27/2013   Procedure: CYSTOSCOPY; REMOVAL OF BLADDER CALCULUS;  Surgeon: [name], MD;  Location: [facility];  Service: Urology;  Laterality: N/A;  . CYSTOSCOPY W/ URETERAL STENT PLACEMENT N/A 2012  . CYSTOSCOPY W/ URETERAL STENT REMOVAL N/A 2013  . IR URETERAL STENT LEFT NEW ACCESS W/O SEP NEPHROSTOMY CATH  08/07/2018  . LITHOTRIPSY  2012  . NEPHROLITHOTOMY Left 08/07/2018   Procedure: NEPHROLITHOTOMY PERCUTANEOUS;  Surgeon: [name], MD;  Location: [facility];  Service: Urology;  Laterality: Left;  . NEUROMA SURGERY Left 1997    Social History   Tobacco Use  . Smoking status: Never Smoker  . Smokeless tobacco: Never Used  Substance Use Topics  . Alcohol use: No  . Drug use: No    History reviewed. No pertinent family history.  Outpatient Encounter Medications as of 11/17/2018  Medication Sig  . cephALEXin (KEFLEX) 250 MG capsule Take 1 capsule (250 mg total) by mouth 2 (two) times daily. (Patient not taking: Reported on 11/17/2018)  . clonazePAM (KLONOPIN) 1 MG tablet Take 1 mg by mouth daily as needed for anxiety (panic attacks).  . fexofenadine (ALLEGRA) 180 MG tablet Take 180 mg by mouth at bedtime.   . gabapentin (NEURONTIN) 300 MG capsule Take 900 mg by mouth at bedtime.   Marland Kitchen glipiZIDE (GLUCOTROL XL) 10 MG 24 hr tablet Take 10 mg by mouth every morning.  .  hydrochlorothiazide (HYDRODIURIL) 25 MG tablet Take 25 mg by mouth daily.  Marland Kitchen lisinopril (PRINIVIL,ZESTRIL) 5 MG tablet Take 5 mg by mouth every morning.  . metFORMIN (GLUCOPHAGE-XR) 500 MG 24 hr tablet Take 500 mg by mouth at bedtime.  Marland Kitchen oxybutynin (DITROPAN) 5 MG tablet Take 1 tablet (5 mg total) by mouth every 8 (eight) hours as needed for bladder spasms. (Patient not taking: Reported on 11/17/2018)  . oxyCODONE (ROXICODONE) 5 MG immediate release tablet Take 1 tablet (5 mg total) by mouth every 4 (four) hours as needed for severe pain. (Patient not taking: Reported on 11/17/2018)  . sertraline (ZOLOFT) 100 MG tablet Take 100 mg by mouth daily.  . simvastatin (ZOCOR) 80 MG tablet Take 80 mg by mouth every evening.   No facility-administered encounter medications on file as of 11/17/2018.     Allergies  Allergen Reactions  . Sulfa Antibiotics Itching and Rash     HPI  Allison Oneill is known to have hypercalcemia at least since 2015 at which time her calcium was 10.9.  More recently in February  2020 she was found to have calcium level of 10.8%.   -She does not recall having a diagnosis of hyperparathyroidism.   She denies pituitary, adrenal dysfunctions, nor thyroid dysfunction.  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  She also has family history of nephrolithiasis in her brother. -Her referral package does not include PTH level.  Her most recent 24-hour urine calcium was 168, normal range. -She does not have recent DEXA scan.  She recalls multiple years ago having a negative DEXA scan.  No prior history of fragility fractures or falls. No history of  kidney stones. -No history of CKD.  She is on hydrochlorothiazide 25 mg p.o. daily, not on calcium supplements.   she eats dairy and green, leafy, vegetables on average amounts. -She denies any history of height loss.  ROS:  Constitutional: no  weight gain/loss, no fatigue, no subjective hyperthermia, no subjective hypothermia Eyes: no blurry vision, no xerophthalmia ENT: no sore throat, no nodules palpated in throat, no dysphagia/odynophagia, no hoarseness Cardiovascular: no Chest Pain, no Shortness of Breath, no palpitations, no leg swelling Respiratory: no cough, no shortness of breath  Gastrointestinal: no Nausea/Vomiting/Diarhhea Musculoskeletal: no muscle/joint aches Skin: no rashes Neurological: no tremors, no numbness, no tingling, no dizziness Psychiatric: no depression, no anxiety  PE: BP 126/77   Pulse 77   Temp 97.7 F (36.5 C)   Ht 5\' 5"  (1.651 m)   Wt 169 lb (76.7 kg)   SpO2 94%   BMI 28.12 kg/m , Body mass index is 28.12 kg/m. Wt Readings from Last 3 Encounters:  11/17/18 169 lb (76.7 kg)  08/07/18 168 lb (76.2 kg)  08/01/18 168 lb (76.2 kg)    Constitutional: + Slightly over weight for height, not in acute distress, normal state of mind Eyes: PERRLA, EOMI, no exophthalmos ENT: moist mucous membranes, no gross thyromegaly, no gross cervical lymphadenopathy Cardiovascular: normal precordial activity, Regular Rate and Rhythm, no Murmur/Rubs/Gallops Respiratory:  adequate breathing efforts, no gross chest deformity, Clear to auscultation bilaterally Gastrointestinal: abdomen soft, Non -tender, No distension, Bowel Sounds present Musculoskeletal: no gross deformities, strength intact in all four extremities Skin: moist, warm, no rashes Neurological: no tremor with outstretched hands, Deep tendon reflexes normal in bilateral lower extremities.     CMP ( most recent) CMP     Component Value Date/Time   NA 140 08/01/2018 1017   K 4.0 08/01/2018 1017   CL 106 08/01/2018 1017   CO2 28 08/01/2018 1017   GLUCOSE 149 (H) 08/01/2018 1017   BUN 20 08/01/2018 1017   CREATININE 0.77 08/01/2018 1017   CALCIUM 10.8 (H) 08/01/2018 1017   GFRNONAA >60 08/01/2018 1017   GFRAA >60 08/01/2018 1017     Diabetic  Labs (most recent): Lab Results  Component Value Date   HGBA1C 7.2 (H) 08/01/2018     Assessment: 1. Hypercalcemia / Hyperparathyroidism  Plan: Patient has had several instances of elevated calcium, with the highest level being at 10.9 mg/dL.  No corresponding PTH to review.  Her most recent 24-hour urine calcium was 168, normal.    -She has history of recurrent nephrolithiasis for decades, not clear whether this is related to her ongoing hypercalcemia.    No other apparent complications from hypercalcemia/hyperparathyroidism: no history of  osteoporosis,fragility fractures. No abdominal pain, no major mood disorders, no bone pain.  - I discussed with the patient about the physiology of calcium and parathyroid hormone, and possible  effects  of  increased PTH/ Calcium , including kidney stones, cardiac dysrhythmias, osteoporosis, abdominal pain, etc.   - The work up so far is not sufficient to reach a conclusion for definitive therapy.  she  needs more studies to confirm and classify the parathyroid dysfunction she may have. I will proceed to obtain  repeat intact PTH/calcium, serum magnesium, serum phosphorus.  -September 30, 2018 24-hour urine calcium was 162 mg per 24 hours, normal between 47-462 mg per 24 hours.  - I will request for her next DEXA scan to include the distal  33% of  radius for evaluation of cortical bone, which is predominantly affected by hyperparathyroidism.   -If she is confirmed to have primary hyperparathyroidism, she is a surgical candidate, will be referred to Dr. [name] in [place].  - Time spent with the patient: 45 minutes, of which >50% was spent in obtaining information about her symptoms, reviewing her previous labs, evaluations, and treatments, counseling her about her hypercalcemia complicated by recurrent nephrolithiasis, and developing a plan to confirm the diagnosis and long term treatment as necessary.  Please refer to " Patient Self Inventory" in  the Media  tab for reviewed elements of pertinent patient history.  [name] participated in the discussions, expressed understanding, and voiced agreement with the above plans.  All questions were answered to her satisfaction. she is encouraged to contact clinic should she have any questions or concerns prior to her return visit.  - Return in about 3 weeks (around 12/08/2018) for Labs Today- Non-Fasting Ok, Follow up with Bone Density.   Glade Lloyd, MD [facility] [address] [place], [place] [address] Phone: [phone]  Fax: [phone]    This note was partially dictated with voice recognition software. Similar sounding words can be transcribed inadequately or may not  be corrected upon review.  11/17/2018, 4:15 PM

## 2018-11-18 LAB — COMPLETE METABOLIC PANEL WITH GFR
AG Ratio: 1.7 (calc) (ref 1.0–2.5)
ALT: 32 U/L — ABNORMAL HIGH (ref 6–29)
AST: 45 U/L — ABNORMAL HIGH (ref 10–35)
Albumin: 4.5 g/dL (ref 3.6–5.1)
Alkaline phosphatase (APISO): 69 U/L (ref 37–153)
BUN/Creatinine Ratio: 21 (calc) (ref 6–22)
BUN: 22 mg/dL (ref 7–25)
CO2: 32 mmol/L (ref 20–32)
Calcium: 11.7 mg/dL — ABNORMAL HIGH (ref 8.6–10.4)
Chloride: 107 mmol/L (ref 98–110)
Creat: 1.03 mg/dL — ABNORMAL HIGH (ref 0.50–0.99)
GFR, Est African American: 64 mL/min/{1.73_m2} (ref >=60)
GFR, Est Non African American: 55 mL/min/{1.73_m2} — ABNORMAL LOW (ref >=60)
Globulin: 2.6 g/dL (calc) (ref 1.9–3.7)
Glucose, Bld: 113 mg/dL (ref 65–139)
Potassium: 3.7 mmol/L (ref 3.5–5.3)
Sodium: 144 mmol/L (ref 135–146)
Total Bilirubin: 0.3 mg/dL (ref 0.2–1.2)
Total Protein: 7.1 g/dL (ref 6.1–8.1)

## 2018-11-18 LAB — PTH, INTACT AND CALCIUM
Calcium: 11.7 mg/dL — ABNORMAL HIGH (ref 8.6–10.4)
PTH: 82 pg/mL — ABNORMAL HIGH (ref 14–64)

## 2018-11-18 LAB — PHOSPHORUS: Phosphorus: 2.7 mg/dL (ref 2.1–4.3)

## 2018-11-18 LAB — MAGNESIUM: Magnesium: 1.8 mg/dL (ref 1.5–2.5)

## 2018-11-27 ENCOUNTER — Other Ambulatory Visit: Payer: Self-pay

## 2018-11-27 ENCOUNTER — Ambulatory Visit (HOSPITAL_COMMUNITY)
Admission: RE | Admit: 2018-11-27 | Discharge: 2018-11-27 | Disposition: A | Discharge status: Home / Self Care | Payer: Medicare Other | Source: Ambulatory Visit | Attending: "Endocrinology | Admitting: "Endocrinology

## 2018-11-27 DIAGNOSIS — Z780 Asymptomatic menopausal state: Secondary | ICD-10-CM | POA: Diagnosis not present

## 2018-11-27 DIAGNOSIS — E213 Hyperparathyroidism, unspecified: Secondary | ICD-10-CM | POA: Insufficient documentation

## 2018-12-08 ENCOUNTER — Ambulatory Visit: Payer: Medicare Other | Admitting: "Endocrinology

## 2018-12-09 ENCOUNTER — Other Ambulatory Visit (HOSPITAL_COMMUNITY): Payer: Self-pay | Admitting: Family Medicine

## 2018-12-09 DIAGNOSIS — M810 Age-related osteoporosis without current pathological fracture: Secondary | ICD-10-CM

## 2018-12-14 DIAGNOSIS — Z7984 Long term (current) use of oral hypoglycemic drugs: Secondary | ICD-10-CM | POA: Diagnosis not present

## 2018-12-14 DIAGNOSIS — G629 Polyneuropathy, unspecified: Secondary | ICD-10-CM | POA: Diagnosis not present

## 2018-12-14 DIAGNOSIS — Z79899 Other long term (current) drug therapy: Secondary | ICD-10-CM | POA: Diagnosis not present

## 2018-12-14 DIAGNOSIS — T63441A Toxic effect of venom of bees, accidental (unintentional), initial encounter: Secondary | ICD-10-CM | POA: Diagnosis not present

## 2018-12-14 DIAGNOSIS — M797 Fibromyalgia: Secondary | ICD-10-CM | POA: Diagnosis not present

## 2018-12-14 DIAGNOSIS — E7800 Pure hypercholesterolemia, unspecified: Secondary | ICD-10-CM | POA: Diagnosis not present

## 2018-12-14 DIAGNOSIS — R2231 Localized swelling, mass and lump, right upper limb: Secondary | ICD-10-CM | POA: Diagnosis not present

## 2018-12-14 DIAGNOSIS — M79641 Pain in right hand: Secondary | ICD-10-CM | POA: Diagnosis not present

## 2018-12-22 DIAGNOSIS — R5383 Other fatigue: Secondary | ICD-10-CM | POA: Diagnosis not present

## 2018-12-22 DIAGNOSIS — E213 Hyperparathyroidism, unspecified: Secondary | ICD-10-CM | POA: Diagnosis not present

## 2018-12-22 DIAGNOSIS — E782 Mixed hyperlipidemia: Secondary | ICD-10-CM | POA: Diagnosis not present

## 2018-12-22 DIAGNOSIS — E1140 Type 2 diabetes mellitus with diabetic neuropathy, unspecified: Secondary | ICD-10-CM | POA: Diagnosis not present

## 2018-12-22 DIAGNOSIS — E1165 Type 2 diabetes mellitus with hyperglycemia: Secondary | ICD-10-CM | POA: Diagnosis not present

## 2018-12-25 DIAGNOSIS — N200 Calculus of kidney: Secondary | ICD-10-CM | POA: Diagnosis not present

## 2018-12-25 DIAGNOSIS — I10 Essential (primary) hypertension: Secondary | ICD-10-CM | POA: Diagnosis not present

## 2018-12-25 DIAGNOSIS — E782 Mixed hyperlipidemia: Secondary | ICD-10-CM | POA: Diagnosis not present

## 2018-12-25 DIAGNOSIS — E1140 Type 2 diabetes mellitus with diabetic neuropathy, unspecified: Secondary | ICD-10-CM | POA: Diagnosis not present

## 2018-12-30 ENCOUNTER — Encounter: Payer: Self-pay | Admitting: "Endocrinology

## 2018-12-30 ENCOUNTER — Ambulatory Visit (INDEPENDENT_AMBULATORY_CARE_PROVIDER_SITE_OTHER): Payer: Medicare Other | Admitting: "Endocrinology

## 2018-12-30 ENCOUNTER — Other Ambulatory Visit: Payer: Self-pay

## 2018-12-30 DIAGNOSIS — E210 Primary hyperparathyroidism: Secondary | ICD-10-CM | POA: Diagnosis not present

## 2018-12-30 NOTE — Progress Notes (Signed)
12/30/2018                                  Endocrinology Telehealth Visit Follow up Note -During COVID -19 Pandemic  I connected with Allison Oneill on 12/30/2018   by telephone and verified that I am speaking with the correct person using two identifiers. Allison Oneill, 04/06/1951. she has verbally consented to this visit. All issues noted in this document were discussed and addressed. The format was not optimal for physical exam      Allison Oneill is a 68 y.o.-year-old female, is being engaged in telehealth via telephone for follow-up with repeat labs after she was seen in consultation for hypercalcemia/hyperparathyroidism.   Past Medical History:  Diagnosis Date  . Anxiety   . Diabetes mellitus without complication ([place])    TYPE 2  . Fibromyalgia   . Hypertension   . Left renal stone   . Neuromuscular disorder (HCC)    restless leg syndrome  . Neuropathy    FEET    Past Surgical History:  Procedure Laterality Date  . ABDOMINAL HYSTERECTOMY  2000   COMPLETE  . CYSTOSCOPY N/A 08/27/2013   Procedure: CYSTOSCOPY; REMOVAL OF BLADDER CALCULUS;  Surgeon: [name], MD;  Location: [facility];  Service: Urology;  Laterality: N/A;  . CYSTOSCOPY W/ URETERAL STENT PLACEMENT N/A 2012  . CYSTOSCOPY W/ URETERAL STENT REMOVAL N/A 2013  . IR URETERAL STENT LEFT NEW ACCESS W/O SEP NEPHROSTOMY CATH  08/07/2018  . LITHOTRIPSY  2012  . NEPHROLITHOTOMY Left 08/07/2018   Procedure: NEPHROLITHOTOMY PERCUTANEOUS;  Surgeon: [name], MD;  Location: [facility];  Service: Urology;  Laterality: Left;  . NEUROMA SURGERY Left 1997    Social History   Tobacco Use  . Smoking status: Never Smoker  . Smokeless tobacco: Never Used  Substance Use Topics  . Alcohol use: No  . Drug use: No    History reviewed. No pertinent family history.  Outpatient Encounter Medications as of 12/30/2018  Medication Sig  . cephALEXin (KEFLEX) 250 MG  capsule Take 1 capsule (250 mg total) by mouth 2 (two) times daily. (Patient not taking: Reported on 11/17/2018)  . clonazePAM (KLONOPIN) 1 MG tablet Take 1 mg by mouth daily as needed for anxiety (panic attacks).  . fexofenadine (ALLEGRA) 180 MG tablet Take 180 mg by mouth at bedtime.   . gabapentin (NEURONTIN) 300 MG capsule Take 900 mg by mouth at bedtime.   Marland Kitchen glipiZIDE (GLUCOTROL XL) 10 MG 24 hr tablet Take 10 mg by mouth every morning.  . hydrochlorothiazide (HYDRODIURIL) 25 MG tablet Take 25 mg by mouth daily.  Marland Kitchen lisinopril (PRINIVIL,ZESTRIL) 5 MG tablet Take 5 mg by mouth every morning.  . metFORMIN (GLUCOPHAGE-XR) 500 MG 24 hr tablet Take 500 mg by mouth at bedtime.  Marland Kitchen oxybutynin (DITROPAN) 5 MG tablet Take 1 tablet (5 mg total) by mouth every 8 (eight) hours as needed for bladder spasms. (Patient not taking: Reported on 11/17/2018)  . oxyCODONE (ROXICODONE) 5 MG immediate release tablet Take 1 tablet (5 mg total) by mouth every 4 (four) hours as needed for  severe pain. (Patient not taking: Reported on 11/17/2018)  . sertraline (ZOLOFT) 100 MG tablet Take 100 mg by mouth daily.  . simvastatin (ZOCOR) 80 MG tablet Take 80 mg by mouth every evening.   No facility-administered encounter medications on file as of 12/30/2018.     Allergies  Allergen Reactions  . Sulfa Antibiotics Itching and Rash     HPI  Lamae A Moroney is known to have hypercalcemia at least since 2015 at which time her calcium was 10.9.  -During her last visit, she was sent for repeat full profile parathyroid panel as well as bone density.  She did not have interval events including falls/fractures.-  -Her labs are consistent with primary hyperparathyroidism-see below.    She denies pituitary, adrenal dysfunctions, nor thyroid dysfunction.  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  She also has family history of nephrolithiasis  in her brother.  Her most recent 24-hour urine calcium was 168, normal range.  Her bone density is normal, absent osteopenia/osteoporosis. No prior history of fragility fractures or falls. No history of  kidney stones. -No history of CKD.  She is on hydrochlorothiazide 25 mg p.o. daily, not on calcium supplements.    she eats dairy and green, leafy, vegetables on average amounts. -She denies any history of height loss.  ROS: Limited as above.  PE: There were no vitals taken for this visit., There is no height or weight on file to calculate BMI. Wt Readings from Last 3 Encounters:  11/17/18 169 lb (76.7 kg)  08/07/18 168 lb (76.2 kg)  08/01/18 168 lb (76.2 kg)     Recent Results (from the past 2160 hour(s))  PTH, intact and calcium     Status: Abnormal   Collection Time: 11/17/18  2:52 PM  Result Value Ref Range   PTH 82 (H) 14 - 64 pg/mL    Comment: . Interpretive Guide    Intact PTH           Calcium ------------------    ----------           ------- Normal Parathyroid    Normal               Normal Hypoparathyroidism    Low or Low Normal    Low Hyperparathyroidism    Primary            Normal or High       High    Secondary          High                 Normal or Low    Tertiary           High                 High Non-Parathyroid    Hypercalcemia      Low or Low Normal    High .    Calcium 11.7 (H) 8.6 - 10.4 mg/dL  Magnesium     Status: None   Collection Time: 11/17/18  2:52 PM  Result Value Ref Range   Magnesium 1.8 1.5 - 2.5 mg/dL  Phosphorus     Status: None   Collection Time: 11/17/18  2:52 PM  Result Value Ref Range   Phosphorus 2.7 2.1 - 4.3 mg/dL  COMPLETE METABOLIC PANEL WITH GFR     Status: Abnormal   Collection Time: 11/17/18  2:52 PM  Result  Value Ref Range   Glucose, Bld 113 65 - 139 mg/dL    Comment: .        Non-fasting reference interval .    BUN 22 7 - 25 mg/dL   Creat 1.03 (H) 0.50 - 0.99 mg/dL    Comment: For patients >87 years of age, the  reference limit for Creatinine is approximately 13% higher for people identified as African-American. .    GFR, Est Non African American 55 (L) > OR = 60 mL/min/1.74m2   GFR, Est African American 64 > OR = 60 mL/min/1.71m2   BUN/Creatinine Ratio 21 6 - 22 (calc)   Sodium 144 135 - 146 mmol/L   Potassium 3.7 3.5 - 5.3 mmol/L   Chloride 107 98 - 110 mmol/L   CO2 32 20 - 32 mmol/L   Calcium 11.7 (H) 8.6 - 10.4 mg/dL   Total Protein 7.1 6.1 - 8.1 g/dL   Albumin 4.5 3.6 - 5.1 g/dL   Globulin 2.6 1.9 - 3.7 g/dL (calc)   AG Ratio 1.7 1.0 - 2.5 (calc)   Total Bilirubin 0.3 0.2 - 1.2 mg/dL   Alkaline phosphatase (APISO) 69 37 - 153 U/L   AST 45 (H) 10 - 35 U/L   ALT 32 (H) 6 - 29 U/L    Diabetic Labs (most recent): Lab Results  Component Value Date   HGBA1C 7.2 (H) 08/01/2018     Assessment: 1. Hypercalcemia / Hyperparathyroidism  Plan: Her work-up confirms primary hyperparathyroidism.  -She has history of recurrent nephrolithiasis for decades, likely related to her hypercalcemia from hyperparathyroidism.  Her bone density is negative for osteopenia/osteoporosis.  -September 30, 2018 24-hour urine calcium was 162 mg per 24 hours, normal between 47-462 mg per 24 hours.  -I discussed all the potential complications of untreated hypercalcemia.  She is a surgical candidate, will be referred to Dr. [name] in [place].  Time for this visit: 15 minutes. Allison Oneill  participated in the discussions, expressed understanding, and voiced agreement with the above plans.  All questions were answered to her satisfaction. she is encouraged to contact clinic should she have any questions or concerns prior to her return visit.   - Return in about 3 months (around 04/01/2019) for Follow up with Labs after Surgery.   Glade Lloyd, MD [facility] [address] [place], [place] [address] Phone: [phone]  Fax: [phone]    This  note was partially dictated with voice recognition software. Similar sounding words can be transcribed inadequately or may not  be corrected upon review.  12/30/2018, 6:19 PM

## 2018-12-31 DIAGNOSIS — I739 Peripheral vascular disease, unspecified: Secondary | ICD-10-CM | POA: Diagnosis not present

## 2018-12-31 DIAGNOSIS — L110 Acquired keratosis follicularis: Secondary | ICD-10-CM | POA: Diagnosis not present

## 2018-12-31 DIAGNOSIS — L609 Nail disorder, unspecified: Secondary | ICD-10-CM | POA: Diagnosis not present

## 2019-01-14 DIAGNOSIS — E210 Primary hyperparathyroidism: Secondary | ICD-10-CM | POA: Diagnosis not present

## 2019-01-15 ENCOUNTER — Other Ambulatory Visit (HOSPITAL_COMMUNITY): Payer: Self-pay | Admitting: Surgery

## 2019-01-15 ENCOUNTER — Other Ambulatory Visit: Payer: Self-pay | Admitting: Surgery

## 2019-01-15 DIAGNOSIS — E0590 Thyrotoxicosis, unspecified without thyrotoxic crisis or storm: Secondary | ICD-10-CM

## 2019-01-21 ENCOUNTER — Ambulatory Visit (HOSPITAL_COMMUNITY): Payer: Medicare Other

## 2019-01-22 ENCOUNTER — Encounter (HOSPITAL_COMMUNITY)
Admission: RE | Admit: 2019-01-22 | Discharge: 2019-01-22 | Disposition: A | Discharge status: Home / Self Care | Payer: Medicare Other | Source: Ambulatory Visit | Attending: Surgery | Admitting: Surgery

## 2019-01-22 ENCOUNTER — Ambulatory Visit (HOSPITAL_COMMUNITY)
Admission: RE | Admit: 2019-01-22 | Discharge: 2019-01-22 | Disposition: A | Discharge status: Home / Self Care | Payer: Medicare Other | Source: Ambulatory Visit | Attending: Surgery | Admitting: Surgery

## 2019-01-22 ENCOUNTER — Other Ambulatory Visit: Payer: Self-pay

## 2019-01-22 ENCOUNTER — Ambulatory Visit: Payer: Self-pay | Admitting: Surgery

## 2019-01-22 DIAGNOSIS — E0590 Thyrotoxicosis, unspecified without thyrotoxic crisis or storm: Secondary | ICD-10-CM | POA: Insufficient documentation

## 2019-01-22 DIAGNOSIS — D351 Benign neoplasm of parathyroid gland: Secondary | ICD-10-CM | POA: Diagnosis not present

## 2019-01-22 DIAGNOSIS — E210 Primary hyperparathyroidism: Secondary | ICD-10-CM | POA: Diagnosis not present

## 2019-01-22 MED ORDER — TECHNETIUM TC 99M SESTAMIBI - CARDIOLITE
25.0000 | Freq: Once | INTRAVENOUS | Status: AC | PRN
  Administered 2019-01-22: 24.6 via INTRAVENOUS

## 2019-01-22 NOTE — H&P (Signed)
General Surgery [facility]  Allison Oneill DOB: 07/12/1950 Married / Language: English / Race: White Female   History of Present Illness  The patient is a 68 year old female who presents with primary hyperparathyroidism.  CHIEF COMPLAINT: primary hyperparathyroidism  Patient is referred by Dr. Bud Face for surgical evaluation and management of primary hyperparathyroidism. Patient has had long-standing hypercalcemia dating back approximately 5 years. Calcium levels have ranged from 10.8-11.7. Recent testing showed an elevated intact PTH level of 82. 24-hour urine collection for calcium was normal at 162. 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 Recent bone density scan was normal. Patient was seen and evaluated by endocrinology. She is now referred to surgery for further testing and possible surgical intervention. Patient has no prior history of head or neck surgery. There is no family history of parathyroid disease. Patient has no history of other endocrine neoplasms.   Past Surgical History  Foot Surgery  Left. 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 Ciprofloxacin HCl (500MG  Tablet, Oral) Active. Gabapentin (300MG  Capsule, Oral) Active. glipiZIDE ER (10MG  Tablet ER 24HR, Oral) Active. hydroCHLOROthiazide (25MG  Tablet, Oral) Active. Lisinopril (5MG  Tablet, Oral) Active. metFORMIN HCl ER (500MG  Tablet ER 24HR, Oral) Active. Oxybutynin Chloride (5MG  Tablet, Oral) Active. Cephalexin (250MG  Capsule, Oral) Active. Sertraline HCl (100MG  Tablet, Oral) Active. Triamcinolone Acetonide (0.025%  Cream, External) Active. Medications Reconciled  Social History Caffeine use  Carbonated beverages, Tea. No alcohol use  No drug use  Tobacco use  Never smoker.  Family History  Alcohol Abuse  Father. Diabetes Mellitus  Mother, Sister. Heart Disease  Son. Hypertension  Sister. Respiratory Condition  Son.  Pregnancy / Birth History Age at menarche  21 years. Age of menopause  72-50 Maternal age  90-20 Para  3  Other Problems Arthritis  Back Pain  Diabetes Mellitus  Hypercholesterolemia  Kidney Stone  Oophorectomy  Left. Thyroid Disease     Review of Systems General Present- Weight Gain. Not Present- Appetite Loss, Chills, Fatigue, Fever, Night Sweats and Weight Loss. Skin Present- Dryness. Not Present- Change in Wart/Mole, Hives, Jaundice, New Lesions, Non-Healing Wounds, Rash and Ulcer. HEENT Present- Hoarseness, Ringing in the Ears and Seasonal Allergies. Not Present- Earache, Hearing Loss, Nose Bleed, Oral Ulcers, Sinus Pain, Sore Throat, Visual Disturbances, Wears glasses/contact lenses and Yellow Eyes. Respiratory Not Present- Bloody sputum, Chronic Cough, Difficulty Breathing, Snoring and Wheezing. Breast Not Present- Breast Mass, Breast Pain, Nipple Discharge and Skin Changes. Cardiovascular Present- Leg Cramps. Not Present- Chest Pain, Difficulty Breathing Lying Down, Palpitations, Rapid Heart Rate, Shortness of Breath and Swelling of Extremities. Gastrointestinal Present- Difficulty Swallowing. Not Present- Abdominal Pain, Bloating, Bloody Stool, Change in Bowel Habits, Chronic diarrhea, Constipation, Excessive gas, Gets full quickly at meals, Hemorrhoids, Indigestion, Nausea, Rectal Pain and Vomiting. Female Genitourinary Present- Frequency. Not Present- Nocturia, Painful Urination, Pelvic Pain and Urgency. Musculoskeletal Present- Back Pain. Not Present- Joint Pain, Joint Stiffness, Muscle Pain, Muscle Weakness and Swelling of  Extremities. Neurological Present- Numbness. Not Present- Decreased Memory, Fainting, Headaches, Seizures, Tingling, Tremor, Trouble walking and Weakness. Psychiatric Not Present- Anxiety, Bipolar, Change in Sleep Pattern, Depression, Fearful and Frequent crying. Endocrine Present- Hair Changes. Not Present- Cold  Intolerance, Excessive Hunger, Heat Intolerance, Hot flashes and New Diabetes. Hematology Not Present- Blood Thinners, Easy Bruising, Excessive bleeding, Gland problems, HIV and Persistent Infections.  Vitals  Weight: 163.4 lb Height: 65in Body Surface Area: 1.82 m Body Mass Index: 27.19 kg/m  Temp.: 44F  Pulse: 95 (Regular)  P.OX: 99% (Room air) BP: 136/62(Sitting, Left Arm, Standard)  Physical Exam  See vital signs recorded above  GENERAL APPEARANCE Development: normal Nutritional status: normal Gross deformities: none  SKIN Rash, lesions, ulcers: none Induration, erythema: none Nodules: none palpable  EYES Conjunctiva and lids: normal Pupils: equal and reactive Iris: normal bilaterally  EARS, NOSE, MOUTH, THROAT External ears: no lesion or deformity External nose: no lesion or deformity Hearing: grossly normal  NECK Symmetric: yes Trachea: midline Thyroid: no palpable nodules in the thyroid bed  CHEST Respiratory effort: normal Retraction or accessory muscle use: no Breath sounds: normal bilaterally Rales, rhonchi, wheeze: none  CARDIOVASCULAR Auscultation: regular rhythm, normal rate Murmurs: none Pulses: carotid and radial pulse 2+ palpable Lower extremity edema: none Lower extremity varicosities: none  MUSCULOSKELETAL Station and gait: normal Digits and nails: no clubbing or cyanosis Muscle strength: grossly normal all extremities Range of motion: grossly normal all extremities Deformity: none  LYMPHATIC Cervical: none palpable Supraclavicular: none palpable  PSYCHIATRIC Oriented to person, place, and time: yes Mood and  affect: normal for situation Judgment and insight: appropriate for situation    Assessment & Plan  PRIMARY HYPERPARATHYROIDISM (E21.0)   Pt Education - Pamphlet Given - The Parathyroid Surgery Book: discussed with patient and provided information.  Follow Up - Call [facility] office after tests / studies doneto discuss further plans  Patient is referred by her endocrinologist for surgical evaluation of primary hyperparathyroidism. Patient is provided with written literature on parathyroid surgery to review at home.  Patient has biochemical evidence of primary hyperparathyroidism. She has had complications including nephrolithiasis and chronic fatigue. I have recommended proceeding with imaging studies to include an ultrasound examination of the neck as well as a nuclear medicine parathyroid scan. We will make arrangements for the studies in the near future. If they localize a parathyroid adenoma, then the patient will be a good candidate for minimally invasive surgery. We discussed the procedure today. If the studies failed to localize a parathyroid adenoma, then we will proceed with a 4D CT scan in hopes of identifying the adenoma preoperatively.  Patient is in agreement. We will proceed with imaging. We will contact her with the results when they are available.  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  Plan minimally invasive surgery.  The risks and benefits of the procedure have been discussed at length with the patient.  The patient understands the proposed procedure, potential alternative treatments, and the course of recovery to be expected.  All of the patient's questions have been answered at this time.  The patient wishes to proceed with surgery.  [name], [place] Office: [phone]

## 2019-02-12 ENCOUNTER — Other Ambulatory Visit: Payer: Self-pay

## 2019-02-12 NOTE — Patient Outreach (Signed)
[place] [facility]) Care Management  02/12/2019  [name] Jun 29, 1950 MD:[id number]   Medication Adherence call to [facility] Compliant Voice message left with a call back number. Allison [facility] is showing past due on Glipizide Er 10 mg and Metformin Er 500 mg under [place].   [name] Management Direct Dial [phone]  Fax [phone] [name]

## 2019-02-16 NOTE — Patient Instructions (Addendum)
DUE TO COVID-19 ONLY ONE VISITOR IS ALLOWED TO COME WITH YOU AND STAY IN THE WAITING ROOM ONLY DURING PRE OP AND PROCEDURE DAY OF SURGERY. THE 1 VISITOR MAY VISIT WITH YOU AFTER SURGERY IN YOUR PRIVATE ROOM DURING VISITING HOURS ONLY!  YOU NEED TO HAVE A COVID 19 TEST ON 02-24-2019 at 1030 am, THIS TEST MUST BE DONE BEFORE SURGERY, COME  Allison Oneill, [place] , [address].  ([place]) ONCE YOUR COVID TEST IS COMPLETED, PLEASE BEGIN THE QUARANTINE INSTRUCTIONS AS OUTLINED IN YOUR HANDOUT.                [facility]    Your procedure is scheduled on: 02-27-2019   Report to [facility]   Report to admitting at 945  AM     Call this number if you have problems the morning of surgery [phone]    Remember: Do not eat food or drink liquids :After Midnight. BRUSH YOUR TEETH MORNING OF SURGERY AND RINSE YOUR MOUTH OUT, NO CHEWING GUM CANDY OR MINTS.     Take these medicines the morning of surgery with A SIP OF WATER: clonazepam if needed, sertraline (zoloft)  How to Manage Your Diabetes Before and After Surgery  Why is it important to control my blood sugar before and after surgery? . Improving blood sugar levels before and after surgery helps healing and can limit problems. . A way of improving blood sugar control is eating a healthy diet by: o  Eating less sugar and carbohydrates o  Increasing activity/exercise o  Talking with your doctor about reaching your blood sugar goals . High blood sugars (greater than 180 mg/dL) can raise your risk of infections and slow your recovery, so you will need to focus on controlling your diabetes during the weeks before surgery. . Make sure that the doctor who takes care of your diabetes knows about your planned surgery including the date and location.  How do I manage my blood sugar before surgery? . Check your blood sugar at least 4 times a day, starting 2 days before surgery, to make sure that the  level is not too high or low. o Check your blood sugar the morning of your surgery when you wake up and every 2 hours until you get to the Short Stay unit. . If your blood sugar is less than 70 mg/dL, you will need to treat for low blood sugar: o Do not take insulin. o Treat a low blood sugar (less than 70 mg/dL) with  cup of clear juice (cranberry or apple), 4 glucose tablets, OR glucose gel. o Recheck blood sugar in 15 minutes after treatment (to make sure it is greater than 70 mg/dL). If your blood sugar is not greater than 70 mg/dL on recheck, call [phone] for further instructions. . Report your blood sugar to the short stay nurse when you get to Short Stay.  . If you are admitted to the hospital after surgery: o Your blood sugar will be checked by the staff and you will probably be given insulin after surgery (instead of oral diabetes medicines) to make sure you have good blood sugar levels. o The goal for blood sugar control after surgery is 80-180 mg/dL.   WHAT DO I DO ABOUT MY DIABETES MEDICATION?  Marland Kitchen Do not take oral diabetes medicines (pills) the morning of surgery.  . THE  DAY  BEFORE SURGERY TAKE METFORMIN AS USUAL.    THE MORNING  OF SURGERY DO NOT TAKE METFORMIN.   .  THE DAY BEFORE SURGERY TAKE GLIPIZIDE AS USUAL. . .  THE MORNING OF SURGERY DO NOT TAKE GLIPIZIDE.     DO NOT TAKE ANY DIABETIC MEDICATIONS DAY OF YOUR SURGERY                               You may not have any metal on your body including hair pins and              piercings  Do not wear jewelry, make-up, lotions, powders or perfumes, deodorant             Do not wear nail polish.  Do not shave  48 hours prior to surgery.              Men may shave face and neck.   Do not bring valuables to the hospital. [place].  Contacts, dentures or bridgework may not be worn into surgery.  Leave suitcase in the car. After surgery it may be brought to your  room.     Patients discharged the day of surgery will not be allowed to drive home. IF YOU ARE HAVING SURGERY AND GOING HOME THE SAME DAY, YOU MUST HAVE AN ADULT TO DRIVE YOU HOME AND BE WITH YOU FOR 24 HOURS. YOU MAY GO HOME BY TAXI OR UBER OR ORTHERWISE, BUT AN ADULT MUST ACCOMPANY YOU HOME AND STAY WITH YOU FOR 24 HOURS.  Name and phone number of your driver: friend Allison Oneill pt to bring cell number  Special Instructions: N/A              Please read over the following fact sheets you were given: _____________________________________________________________________             [facility] - Preparing for Surgery Before surgery, you can play an important role.  Because skin is not sterile, your skin needs to be as free of germs as possible.  You can reduce the number of germs on your skin by washing with CHG (chlorahexidine gluconate) soap before surgery.  CHG is an antiseptic cleaner which kills germs and bonds with the skin to continue killing germs even after washing. Please DO NOT use if you have an allergy to CHG or antibacterial soaps.  If your skin becomes reddened/irritated stop using the CHG and inform your nurse when you arrive at Short Stay. Do not shave (including legs and underarms) for at least 48 hours prior to the first CHG shower.  You may shave your face/neck. Please follow these instructions carefully:  1.  Shower with CHG Soap the night before surgery and the  morning of Surgery.  2.  If you choose to wash your hair, wash your hair first as usual with your  normal  shampoo.  3.  After you shampoo, rinse your hair and body thoroughly to remove the  shampoo.                           4.  Use CHG as you would any other liquid soap.  You can apply chg directly  to the skin and wash  Gently with a scrungie or clean washcloth.  5.  Apply the CHG Soap to your body ONLY FROM THE NECK DOWN.   Do not use on face/ open                           Wound or open  sores. Avoid contact with eyes, ears mouth and genitals (private parts).                       Wash face,  Genitals (private parts) with your normal soap.             6.  Wash thoroughly, paying special attention to the area where your surgery  will be performed.  7.  Thoroughly rinse your body with warm water from the neck down.  8.  DO NOT shower/wash with your normal soap after using and rinsing off  the CHG Soap.                9.  Pat yourself dry with a clean towel.            10.  Wear clean pajamas.            11.  Place clean sheets on your bed the night of your first shower and do not  sleep with pets. Day of Surgery : Do not apply any lotions/deodorants the morning of surgery.  Please wear clean clothes to the hospital/surgery center.  FAILURE TO FOLLOW THESE INSTRUCTIONS MAY RESULT IN THE CANCELLATION OF YOUR SURGERY PATIENT SIGNATURE_________________________________  NURSE SIGNATURE__________________________________  ________________________________________________________________________

## 2019-02-18 ENCOUNTER — Encounter (HOSPITAL_COMMUNITY): Payer: Self-pay

## 2019-02-18 ENCOUNTER — Other Ambulatory Visit: Payer: Self-pay

## 2019-02-18 ENCOUNTER — Encounter (HOSPITAL_COMMUNITY)
Admission: RE | Admit: 2019-02-18 | Discharge: 2019-02-18 | Disposition: A | Discharge status: Home / Self Care | Payer: Medicare Other | Source: Ambulatory Visit | Attending: Surgery | Admitting: Surgery

## 2019-02-18 DIAGNOSIS — E119 Type 2 diabetes mellitus without complications: Secondary | ICD-10-CM | POA: Diagnosis not present

## 2019-02-18 DIAGNOSIS — Z7984 Long term (current) use of oral hypoglycemic drugs: Secondary | ICD-10-CM | POA: Diagnosis not present

## 2019-02-18 DIAGNOSIS — E210 Primary hyperparathyroidism: Secondary | ICD-10-CM | POA: Diagnosis not present

## 2019-02-18 DIAGNOSIS — Z79899 Other long term (current) drug therapy: Secondary | ICD-10-CM | POA: Diagnosis not present

## 2019-02-18 DIAGNOSIS — Z01812 Encounter for preprocedural laboratory examination: Secondary | ICD-10-CM | POA: Insufficient documentation

## 2019-02-18 LAB — CBC
HCT: 39.4 % (ref 36.0–46.0)
Hemoglobin: 12.3 g/dL (ref 12.0–15.0)
MCH: 30.7 pg (ref 26.0–34.0)
MCHC: 31.2 g/dL (ref 30.0–36.0)
MCV: 98.3 fL (ref 80.0–100.0)
Platelets: 249 10*3/uL (ref 150–400)
RBC: 4.01 MIL/uL (ref 3.87–5.11)
RDW: 14 % (ref 11.5–15.5)
WBC: 7 10*3/uL (ref 4.0–10.5)
nRBC: 0 % (ref 0.0–0.2)

## 2019-02-18 LAB — BASIC METABOLIC PANEL
Anion gap: 9 (ref 5–15)
BUN: 24 mg/dL — ABNORMAL HIGH (ref 8–23)
CO2: 26 mmol/L (ref 22–32)
Calcium: 10.7 mg/dL — ABNORMAL HIGH (ref 8.9–10.3)
Chloride: 107 mmol/L (ref 98–111)
Creatinine, Ser: 1 mg/dL (ref 0.44–1.00)
GFR calc Af Amer: >60 mL/min (ref >60)
GFR calc non Af Amer: 57 mL/min — ABNORMAL LOW (ref >60)
Glucose, Bld: 174 mg/dL — ABNORMAL HIGH (ref 70–99)
Potassium: 3.7 mmol/L (ref 3.5–5.1)
Sodium: 142 mmol/L (ref 135–145)

## 2019-02-18 LAB — GLUCOSE, CAPILLARY: Glucose-Capillary: 190 mg/dL — ABNORMAL HIGH (ref 70–99)

## 2019-02-18 NOTE — Progress Notes (Signed)
PCP - dr Remo Lipps burdine Cardiologist - none  Chest x-ray - none EKG -  Stress Test - 08-01-18 epic ECHO - none Cardiac Cath - none  Sleep Study - none CPAP - none  Fasting Blood Sugar -  Checks Blood Sugar none  Blood Thinner Instructions:none Aspirin Instructions:none Last Dose:  Anesthesia review:   Patient denies shortness of breath, fever, cough and chest pain at PAT appointment   Patient verbalized understanding of instructions that were given to them at the PAT appointment. Patient was also instructed that they will need to review over the PAT instructions again at home before surgery.

## 2019-02-22 ENCOUNTER — Encounter (HOSPITAL_COMMUNITY): Payer: Self-pay | Admitting: Surgery

## 2019-02-22 NOTE — H&P (Signed)
General Surgery [facility]  Allison Oneill DOB: [birth year] Married / Language: English / Race: White Female   History of Present Illness   The patient is a 68 year old female who presents with primary hyperparathyroidism.  CHIEF COMPLAINT: primary hyperparathyroidism  Patient is referred by Dr. Bud Face for surgical evaluation and management of primary hyperparathyroidism. Patient has had long-standing hypercalcemia dating back approximately 5 years. Calcium levels have ranged from 10.8-11.7. Recent testing showed an elevated intact PTH level of 82. 24-hour urine collection for calcium was normal at 162. 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 Recent bone density scan was normal. Patient was seen and evaluated by endocrinology. She is now referred to surgery for further testing and possible surgical intervention. Patient has no prior history of head or neck surgery. There is no family history of parathyroid disease. Patient has no history of other endocrine neoplasms.   Past Surgical History Foot Surgery  Left. Hysterectomy (due to cancer) - Complete  Hysterectomy (not due to cancer) - Complete   Diagnostic Studies History Colonoscopy  1-5 years ago Mammogram  1-3 years ago  Allergies Sulfa Drugs  Allergies Reconciled   Medication History Simvastatin (80MG  Tablet, Oral) Active. Ciprofloxacin HCl (500MG  Tablet, Oral) Active. Gabapentin (300MG  Capsule, Oral) Active. glipiZIDE ER (10MG  Tablet ER 24HR, Oral) Active. hydroCHLOROthiazide (25MG  Tablet, Oral) Active. Lisinopril (5MG  Tablet, Oral) Active. metFORMIN HCl ER (500MG  Tablet ER 24HR, Oral) Active. Oxybutynin Chloride (5MG  Tablet, Oral) Active. Cephalexin (250MG  Capsule, Oral) Active. Sertraline HCl (100MG  Tablet, Oral) Active. Triamcinolone Acetonide (0.025%  Cream, External) Active. Medications Reconciled  Social History  Caffeine use  Carbonated beverages, Tea. No alcohol use  No drug use  Tobacco use  Never smoker.  Family History  Alcohol Abuse  Father. Diabetes Mellitus  Mother, Sister. Heart Disease  Son. Hypertension  Sister. Respiratory Condition  Son.  Pregnancy / Birth History  Age at menarche  60 years. Age of menopause  1-50 Maternal age  84-20 Para  3  Other Problems  Arthritis  Back Pain  Diabetes Mellitus  Hypercholesterolemia  Kidney Stone  Oophorectomy  Left. Thyroid Disease   Review of Systems  General Present- Weight Gain. Not Present- Appetite Loss, Chills, Fatigue, Fever, Night Sweats and Weight Loss. Skin Present- Dryness. Not Present- Change in Wart/Mole, Hives, Jaundice, New Lesions, Non-Healing Wounds, Rash and Ulcer. HEENT Present- Hoarseness, Ringing in the Ears and Seasonal Allergies. Not Present- Earache, Hearing Loss, Nose Bleed, Oral Ulcers, Sinus Pain, Sore Throat, Visual Disturbances, Wears glasses/contact lenses and Yellow Eyes. Respiratory Not Present- Bloody sputum, Chronic Cough, Difficulty Breathing, Snoring and Wheezing. Breast Not Present- Breast Mass, Breast Pain, Nipple Discharge and Skin Changes. Cardiovascular Present- Leg Cramps. Not Present- Chest Pain, Difficulty Breathing Lying Down, Palpitations, Rapid Heart Rate, Shortness of Breath and Swelling of Extremities. Gastrointestinal Present- Difficulty Swallowing. Not Present- Abdominal Pain, Bloating, Bloody Stool, Change in Bowel Habits, Chronic diarrhea, Constipation, Excessive gas, Gets full quickly at meals, Hemorrhoids, Indigestion, Nausea, Rectal Pain and Vomiting. Female Genitourinary Present- Frequency. Not Present- Nocturia, Painful Urination, Pelvic Pain and Urgency. Musculoskeletal Present- Back Pain. Not Present- Joint Pain, Joint Stiffness, Muscle Pain, Muscle Weakness and Swelling of  Extremities. Neurological Present- Numbness. Not Present- Decreased Memory, Fainting, Headaches, Seizures, Tingling, Tremor, Trouble walking and Weakness. Psychiatric Not Present- Anxiety, Bipolar, Change in Sleep Pattern, Depression, Fearful and Frequent crying. Endocrine Present- Hair Changes. Not Present- Cold  Intolerance, Excessive Hunger, Heat Intolerance, Hot flashes and New Diabetes. Hematology Not Present- Blood Thinners, Easy Bruising, Excessive bleeding, Gland problems, HIV and Persistent Infections.  Vitals  Weight: 163.4 lb Height: 65in Body Surface Area: 1.82 m Body Mass Index: 27.19 kg/m  Temp.: 54F  Pulse: 95 (Regular)  P.OX: 99% (Room air) BP: 136/62(Sitting, Left Arm, Standard)  Physical Exam   See vital signs recorded above  GENERAL APPEARANCE Development: normal Nutritional status: normal Gross deformities: none  SKIN Rash, lesions, ulcers: none Induration, erythema: none Nodules: none palpable  EYES Conjunctiva and lids: normal Pupils: equal and reactive Iris: normal bilaterally  EARS, NOSE, MOUTH, THROAT External ears: no lesion or deformity External nose: no lesion or deformity Hearing: grossly normal  NECK Symmetric: yes Trachea: midline Thyroid: no palpable nodules in the thyroid bed  CHEST Respiratory effort: normal Retraction or accessory muscle use: no Breath sounds: normal bilaterally Rales, rhonchi, wheeze: none  CARDIOVASCULAR Auscultation: regular rhythm, normal rate Murmurs: none Pulses: carotid and radial pulse 2+ palpable Lower extremity edema: none Lower extremity varicosities: none  MUSCULOSKELETAL Station and gait: normal Digits and nails: no clubbing or cyanosis Muscle strength: grossly normal all extremities Range of motion: grossly normal all extremities Deformity: none  LYMPHATIC Cervical: none palpable Supraclavicular: none palpable  PSYCHIATRIC Oriented to person, place, and time: yes Mood and  affect: normal for situation Judgment and insight: appropriate for situation    Assessment & Plan   PRIMARY HYPERPARATHYROIDISM (E21.0)   Pt Education - Pamphlet Given - The Parathyroid Surgery Book: discussed with patient and provided information.  Follow Up - Call [facility] office after tests / studies doneto discuss further plans  Patient is referred by her endocrinologist for surgical evaluation of primary hyperparathyroidism. Patient is provided with written literature on parathyroid surgery to review at home.  Patient has biochemical evidence of primary hyperparathyroidism. She has had complications including nephrolithiasis and chronic fatigue. I have recommended proceeding with imaging studies to include an ultrasound examination of the neck as well as a nuclear medicine parathyroid scan. We will make arrangements for the studies in the near future. If they localize a parathyroid adenoma, then the patient will be a good candidate for minimally invasive surgery. We discussed the procedure today. If the studies failed to localize a parathyroid adenoma, then we will proceed with a 4D CT scan in hopes of identifying the adenoma preoperatively.  Patient is in agreement. We will proceed with imaging. We will contact her with the results when they are available.   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 Patient is contacted by telephone with these results. We will move forward with scheduling her for minimally invasive right inferior parathyroidectomy as an outpatient surgical procedure in the near future.  The risks and benefits of the procedure have been discussed at length with the patient. The patient understands the proposed procedure, potential alternative treatments, and the course of recovery to be expected. All of the patient's questions have been answered at this time. The patient wishes to  proceed with surgery.  [name], [place] [facility] Office: [phone]

## 2019-02-24 ENCOUNTER — Other Ambulatory Visit (HOSPITAL_COMMUNITY)
Admission: RE | Admit: 2019-02-24 | Discharge: 2019-02-24 | Disposition: A | Discharge status: Home / Self Care | Payer: Medicare Other | Source: Ambulatory Visit | Attending: Surgery | Admitting: Surgery

## 2019-02-24 DIAGNOSIS — Z01812 Encounter for preprocedural laboratory examination: Secondary | ICD-10-CM | POA: Diagnosis not present

## 2019-02-24 DIAGNOSIS — Z20828 Contact with and (suspected) exposure to other viral communicable diseases: Secondary | ICD-10-CM | POA: Diagnosis not present

## 2019-02-25 LAB — NOVEL CORONAVIRUS, NAA (HOSP ORDER, SEND-OUT TO REF LAB; TAT 18-24 HRS): SARS-CoV-2, NAA: NOT DETECTED

## 2019-02-26 LAB — HEMOGLOBIN A1C
Hgb A1c MFr Bld: 7.3 % — ABNORMAL HIGH (ref 4.8–5.6)
Mean Plasma Glucose: 163 mg/dL

## 2019-02-26 NOTE — Progress Notes (Signed)
Pt verbalized understanding of surgical time change and to arrive at [facility] admitting by 1015 am on 02/27/2019. Pt understands no food or drink after midnight; may take small sip of water in am to take medications as instructed during PST appt.

## 2019-02-27 ENCOUNTER — Encounter (HOSPITAL_COMMUNITY)
Admission: RE | Disposition: A | Discharge status: Home / Self Care | Payer: Self-pay | Source: Home / Self Care | Attending: Surgery

## 2019-02-27 ENCOUNTER — Ambulatory Visit (HOSPITAL_COMMUNITY): Payer: Medicare Other | Admitting: Certified Registered"

## 2019-02-27 ENCOUNTER — Ambulatory Visit (HOSPITAL_COMMUNITY)
Admission: RE | Admit: 2019-02-27 | Discharge: 2019-02-27 | Disposition: A | Discharge status: Home / Self Care | Payer: Medicare Other | Attending: Surgery | Admitting: Surgery

## 2019-02-27 ENCOUNTER — Encounter (HOSPITAL_COMMUNITY): Payer: Self-pay | Admitting: *Deleted

## 2019-02-27 ENCOUNTER — Ambulatory Visit (HOSPITAL_COMMUNITY): Payer: Medicare Other | Admitting: Physician Assistant

## 2019-02-27 DIAGNOSIS — R5382 Chronic fatigue, unspecified: Secondary | ICD-10-CM | POA: Diagnosis not present

## 2019-02-27 DIAGNOSIS — F419 Anxiety disorder, unspecified: Secondary | ICD-10-CM | POA: Insufficient documentation

## 2019-02-27 DIAGNOSIS — I4510 Unspecified right bundle-branch block: Secondary | ICD-10-CM | POA: Diagnosis not present

## 2019-02-27 DIAGNOSIS — E041 Nontoxic single thyroid nodule: Secondary | ICD-10-CM | POA: Insufficient documentation

## 2019-02-27 DIAGNOSIS — E210 Primary hyperparathyroidism: Secondary | ICD-10-CM | POA: Diagnosis present

## 2019-02-27 DIAGNOSIS — Z811 Family history of alcohol abuse and dependence: Secondary | ICD-10-CM | POA: Diagnosis not present

## 2019-02-27 DIAGNOSIS — Z833 Family history of diabetes mellitus: Secondary | ICD-10-CM | POA: Diagnosis not present

## 2019-02-27 DIAGNOSIS — Z7984 Long term (current) use of oral hypoglycemic drugs: Secondary | ICD-10-CM | POA: Insufficient documentation

## 2019-02-27 DIAGNOSIS — I10 Essential (primary) hypertension: Secondary | ICD-10-CM | POA: Diagnosis not present

## 2019-02-27 DIAGNOSIS — E119 Type 2 diabetes mellitus without complications: Secondary | ICD-10-CM | POA: Insufficient documentation

## 2019-02-27 DIAGNOSIS — M797 Fibromyalgia: Secondary | ICD-10-CM | POA: Insufficient documentation

## 2019-02-27 DIAGNOSIS — Z87442 Personal history of urinary calculi: Secondary | ICD-10-CM | POA: Insufficient documentation

## 2019-02-27 DIAGNOSIS — G629 Polyneuropathy, unspecified: Secondary | ICD-10-CM | POA: Diagnosis not present

## 2019-02-27 DIAGNOSIS — Z90710 Acquired absence of both cervix and uterus: Secondary | ICD-10-CM | POA: Insufficient documentation

## 2019-02-27 DIAGNOSIS — D351 Benign neoplasm of parathyroid gland: Secondary | ICD-10-CM | POA: Diagnosis not present

## 2019-02-27 DIAGNOSIS — G2581 Restless legs syndrome: Secondary | ICD-10-CM | POA: Diagnosis not present

## 2019-02-27 DIAGNOSIS — Z8249 Family history of ischemic heart disease and other diseases of the circulatory system: Secondary | ICD-10-CM | POA: Diagnosis not present

## 2019-02-27 DIAGNOSIS — Z79899 Other long term (current) drug therapy: Secondary | ICD-10-CM | POA: Insufficient documentation

## 2019-02-27 DIAGNOSIS — Z882 Allergy status to sulfonamides status: Secondary | ICD-10-CM | POA: Insufficient documentation

## 2019-02-27 DIAGNOSIS — M1990 Unspecified osteoarthritis, unspecified site: Secondary | ICD-10-CM | POA: Diagnosis not present

## 2019-02-27 DIAGNOSIS — Z836 Family history of other diseases of the respiratory system: Secondary | ICD-10-CM | POA: Diagnosis not present

## 2019-02-27 DIAGNOSIS — E7800 Pure hypercholesterolemia, unspecified: Secondary | ICD-10-CM | POA: Insufficient documentation

## 2019-02-27 DIAGNOSIS — M549 Dorsalgia, unspecified: Secondary | ICD-10-CM | POA: Diagnosis not present

## 2019-02-27 HISTORY — PX: PARATHYROIDECTOMY: SHX19

## 2019-02-27 LAB — GLUCOSE, CAPILLARY
Glucose-Capillary: 130 mg/dL — ABNORMAL HIGH (ref 70–99)
Glucose-Capillary: 119 mg/dL — ABNORMAL HIGH (ref 70–99)

## 2019-02-27 SURGERY — PARATHYROIDECTOMY
Anesthesia: General | Site: Neck | Laterality: Right

## 2019-02-27 MED ORDER — ROCURONIUM BROMIDE 10 MG/ML (PF) SYRINGE
PREFILLED_SYRINGE | INTRAVENOUS | Status: DC | PRN
  Administered 2019-02-27: 50 mg via INTRAVENOUS

## 2019-02-27 MED ORDER — TRAMADOL HCL 50 MG PO TABS: 50.0000 mg | ORAL_TABLET | Freq: Four times a day (QID) | ORAL | 0 refills | Status: DC | PRN

## 2019-02-27 MED ORDER — CEFAZOLIN SODIUM-DEXTROSE 2-4 GM/100ML-% IV SOLN
2.0000 g | INTRAVENOUS | Status: AC
  Administered 2019-02-27: 2 g via INTRAVENOUS

## 2019-02-27 MED ORDER — ONDANSETRON HCL 4 MG/2ML IJ SOLN: 4.0000 mg | Freq: Once | INTRAMUSCULAR | Status: DC | PRN

## 2019-02-27 MED ORDER — LIDOCAINE 2% (20 MG/ML) 5 ML SYRINGE
INTRAMUSCULAR | Status: DC | PRN
  Administered 2019-02-27: 40 mg via INTRAVENOUS

## 2019-02-27 MED ORDER — CHLORHEXIDINE GLUCONATE CLOTH 2 % EX PADS: 6.0000 | MEDICATED_PAD | Freq: Once | CUTANEOUS | Status: DC

## 2019-02-27 MED ORDER — MEPERIDINE HCL 50 MG/ML IJ SOLN: 6.2500 mg | INTRAMUSCULAR | Status: DC | PRN

## 2019-02-27 MED ORDER — BUPIVACAINE HCL (PF) 0.25 % IJ SOLN
INTRAMUSCULAR | Status: AC
  Filled 2019-02-27: qty 30

## 2019-02-27 MED ORDER — CEFAZOLIN SODIUM-DEXTROSE 2-4 GM/100ML-% IV SOLN
INTRAVENOUS | Status: AC
  Filled 2019-02-27: qty 100

## 2019-02-27 MED ORDER — FENTANYL CITRATE (PF) 250 MCG/5ML IJ SOLN
INTRAMUSCULAR | Status: DC | PRN
  Administered 2019-02-27 (×3): 50 ug via INTRAVENOUS

## 2019-02-27 MED ORDER — FENTANYL CITRATE (PF) 100 MCG/2ML IJ SOLN: 25.0000 ug | INTRAMUSCULAR | Status: DC | PRN

## 2019-02-27 MED ORDER — ONDANSETRON HCL 4 MG/2ML IJ SOLN
INTRAMUSCULAR | Status: DC | PRN
  Administered 2019-02-27: 4 mg via INTRAVENOUS

## 2019-02-27 MED ORDER — EPHEDRINE SULFATE-NACL 50-0.9 MG/10ML-% IV SOSY
PREFILLED_SYRINGE | INTRAVENOUS | Status: DC | PRN
  Administered 2019-02-27 (×3): 5 mg via INTRAVENOUS
  Administered 2019-02-27: 10 mg via INTRAVENOUS

## 2019-02-27 MED ORDER — ALBUTEROL SULFATE HFA 108 (90 BASE) MCG/ACT IN AERS
INHALATION_SPRAY | RESPIRATORY_TRACT | Status: AC
  Filled 2019-02-27: qty 6.7

## 2019-02-27 MED ORDER — SUGAMMADEX SODIUM 200 MG/2ML IV SOLN
INTRAVENOUS | Status: DC | PRN
  Administered 2019-02-27: 150 mg via INTRAVENOUS

## 2019-02-27 MED ORDER — ALBUTEROL SULFATE HFA 108 (90 BASE) MCG/ACT IN AERS
INHALATION_SPRAY | RESPIRATORY_TRACT | Status: DC | PRN
  Administered 2019-02-27: 2 via RESPIRATORY_TRACT

## 2019-02-27 MED ORDER — PHENYLEPHRINE 40 MCG/ML (10ML) SYRINGE FOR IV PUSH (FOR BLOOD PRESSURE SUPPORT)
PREFILLED_SYRINGE | INTRAVENOUS | Status: DC | PRN
  Administered 2019-02-27: 80 ug via INTRAVENOUS

## 2019-02-27 MED ORDER — FENTANYL CITRATE (PF) 250 MCG/5ML IJ SOLN
INTRAMUSCULAR | Status: AC
  Filled 2019-02-27: qty 5

## 2019-02-27 MED ORDER — DEXAMETHASONE SODIUM PHOSPHATE 10 MG/ML IJ SOLN
INTRAMUSCULAR | Status: DC | PRN
  Administered 2019-02-27: 5 mg via INTRAVENOUS

## 2019-02-27 MED ORDER — LACTATED RINGERS IV SOLN
INTRAVENOUS | Status: DC
  Administered 2019-02-27: 11:00:00 via INTRAVENOUS

## 2019-02-27 MED ORDER — 0.9 % SODIUM CHLORIDE (POUR BTL) OPTIME
TOPICAL | Status: DC | PRN
  Administered 2019-02-27: 1000 mL

## 2019-02-27 MED ORDER — PROPOFOL 10 MG/ML IV BOLUS
INTRAVENOUS | Status: DC | PRN
  Administered 2019-02-27: 140 mg via INTRAVENOUS

## 2019-02-27 MED ORDER — BUPIVACAINE HCL (PF) 0.25 % IJ SOLN
INTRAMUSCULAR | Status: DC | PRN
  Administered 2019-02-27: 10 mL

## 2019-02-27 SURGICAL SUPPLY — 32 items
ADH SKN CLS APL DERMABOND .7 (GAUZE/BANDAGES/DRESSINGS) ×1
APL PRP STRL LF DISP 70% ISPRP (MISCELLANEOUS) ×1
ATTRACTOMAT 16X20 MAGNETIC DRP (DRAPES) ×2 IMPLANT
BLADE SURG 15 STRL LF DISP TIS (BLADE) ×1 IMPLANT
BLADE SURG 15 STRL SS (BLADE) ×2
CHLORAPREP W/TINT 26 (MISCELLANEOUS) ×2 IMPLANT
CLIP VESOCCLUDE MED 6/CT (CLIP) ×4 IMPLANT
CLIP VESOCCLUDE SM WIDE 6/CT (CLIP) ×4 IMPLANT
COVER SURGICAL LIGHT HANDLE (MISCELLANEOUS) ×2 IMPLANT
COVER WAND RF STERILE (DRAPES) ×2 IMPLANT
DERMABOND ADVANCED (GAUZE/BANDAGES/DRESSINGS) ×1
DERMABOND ADVANCED .7 DNX12 (GAUZE/BANDAGES/DRESSINGS) IMPLANT
DRAPE LAPAROTOMY T 98X78 PEDS (DRAPES) ×2 IMPLANT
ELECT PENCIL ROCKER SW 15FT (MISCELLANEOUS) ×2 IMPLANT
ELECT REM PT RETURN 15FT ADLT (MISCELLANEOUS) ×2 IMPLANT
GAUZE 4X4 16PLY RFD (DISPOSABLE) ×2 IMPLANT
GLOVE SURG ORTHO 8.0 STRL STRW (GLOVE) ×2 IMPLANT
GOWN STRL REUS W/TWL XL LVL3 (GOWN DISPOSABLE) ×6 IMPLANT
HEMOSTAT SURGICEL 2X4 FIBR (HEMOSTASIS) ×1 IMPLANT
ILLUMINATOR WAVEGUIDE N/F (MISCELLANEOUS) IMPLANT
KIT BASIN OR (CUSTOM PROCEDURE TRAY) ×2 IMPLANT
KIT TURNOVER KIT A (KITS) ×1 IMPLANT
NDL HYPO 25X1 1.5 SAFETY (NEEDLE) ×1 IMPLANT
NEEDLE HYPO 25X1 1.5 SAFETY (NEEDLE) ×2 IMPLANT
PACK BASIC VI WITH GOWN DISP (CUSTOM PROCEDURE TRAY) ×2 IMPLANT
SUT MNCRL AB 4-0 PS2 18 (SUTURE) ×2 IMPLANT
SUT VIC AB 3-0 SH 18 (SUTURE) ×2 IMPLANT
SYR BULB IRRIGATION 50ML (SYRINGE) ×2 IMPLANT
SYR CONTROL 10ML LL (SYRINGE) ×2 IMPLANT
TOWEL OR 17X26 10 PK STRL BLUE (TOWEL DISPOSABLE) ×2 IMPLANT
TOWEL OR NON WOVEN STRL DISP B (DISPOSABLE) ×2 IMPLANT
TUBING CONNECTING 10 (TUBING) ×2 IMPLANT

## 2019-02-27 NOTE — Op Note (Signed)
OPERATIVE REPORT - PARATHYROIDECTOMY  Preoperative diagnosis: Primary hyperparathyroidism  Postop diagnosis: Same  Procedure: Right inferior minimally invasive parathyroidectomy  Surgeon:  [name], MD  Anesthesia: General endotracheal  Estimated blood loss: Minimal  Preparation: ChloraPrep  Indications: Patient is referred by Dr. Bud Face for surgical evaluation and management of primary hyperparathyroidism. Patient has had long-standing hypercalcemia dating back approximately 5 years. Calcium levels have ranged from 10.8-11.7. Recent testing showed an elevated intact PTH level of 82. 24-hour urine collection for calcium was normal at 162. 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 Recent bone density scan was normal. Patient was seen and evaluated by endocrinology. USN and sestamibi scan localize an adenoma to the right inferior position.  Patient now comes to surgery for parathyroidectomy.  Procedure: The patient was prepared in the pre-operative holding area. The patient was brought to the operating room and placed in a supine position on the operating room table. Following administration of general anesthesia, the patient was positioned and then prepped and draped in the usual strict aseptic fashion. After ascertaining that an adequate level of anesthesia been achieved, a neck incision was made with a #15 blade. Dissection was carried through subcutaneous tissues and platysma. Hemostasis was obtained with the electrocautery. Skin flaps were developed circumferentially and a Weitlander retractor was placed for exposure.  Strap muscles were incised in the midline. Strap muscles were reflected lateralley exposing the thyroid lobe. With gentle blunt dissection the thyroid lobe was mobilized.  Dissection was carried through adipose tissue and an enlarged parathyroid gland was  identified. It was gently mobilized. Vascular structures were divided between small ligaclips. Care was taken to avoid the recurrent laryngeal nerve and the esophagus. The parathyroid gland was completely excised. It was submitted to pathology where frozen section confirmed parathyroid tissue consistent with adenoma.  Neck was irrigated with warm saline and good hemostasis was noted. Fibrillar was placed in the operative field. Strap muscles were approximated in the midline with interrupted 3-0 Vicryl sutures. Platysma was closed with interrupted 3-0 Vicryl sutures. Marcaine was infiltrated circumferentially. Skin was closed with a running 4-0 Monocryl subcuticular suture. Wound was washed and dried and Dermabond was applied. Patient was awakened from anesthesia and brought to the recovery room. The patient tolerated the procedure well.   [name], MD [facility] Office: [phone]

## 2019-02-27 NOTE — Interval H&P Note (Signed)
History and Physical Interval Note:  02/27/2019 11:18 AM  Allison Oneill  has presented today for surgery, with the diagnosis of PRIMARY HYPERPARATHYROIDISM.  The various methods of treatment have been discussed with the patient and family. After consideration of risks, benefits and other options for treatment, the patient has consented to  Procedure(s): RIGHT INFERIOR PARATHYROIDECTOMY (Right) as a surgical intervention.  The patient's history has been reviewed, patient examined, no change in status, stable for surgery.  I have reviewed the patient's chart and labs.  Questions were answered to the patient's satisfaction.     [name]

## 2019-02-27 NOTE — Anesthesia Procedure Notes (Signed)
Procedure Name: Intubation Date/Time: 02/27/2019 11:41 AM Performed by: Eben Burow, CRNA Pre-anesthesia Checklist: Patient identified, Emergency Drugs available, Suction available, Patient being monitored and Timeout performed Patient Re-evaluated:Patient Re-evaluated prior to induction Oxygen Delivery Method: Circle system utilized Preoxygenation: Pre-oxygenation with 100% oxygen Induction Type: IV induction Ventilation: Mask ventilation without difficulty Laryngoscope Size: Mac and 3 Grade View: Grade I Tube type: Oral Tube size: 7.0 mm Number of attempts: 1 Airway Equipment and Method: Stylet Placement Confirmation: ETT inserted through vocal cords under direct vision,  positive ETCO2 and breath sounds checked- equal and bilateral Secured at: 20 cm Tube secured with: Tape Dental Injury: Teeth and Oropharynx as per pre-operative assessment  Comments: Intubated by Orma Flaming, SRNA

## 2019-02-27 NOTE — Discharge Instructions (Addendum)
[facility]  THYROID & PARATHYROID SURGERY:  POST-OP INSTRUCTIONS  Always review your discharge instruction sheet from the facility where your surgery was performed.  A prescription for pain medication may be given to you upon discharge.  Take your pain medication as prescribed.  If narcotic pain medicine is not needed, then you may take acetaminophen (Tylenol) or ibuprofen (Advil) as needed.  Take your usually prescribed medications unless otherwise directed.  If you need a refill on your pain medication, please contact our office during regular business hours.  Prescriptions cannot be processed by our office after 5 pm or on weekends.  Start with a light diet upon arrival home, such as soup and crackers or toast.  Be sure to drink plenty of fluids daily.  Resume your normal diet the day after surgery.  Most patients will experience some swelling and bruising on the chest and neck area.  Ice packs will help.  Swelling and bruising can take several days to resolve.   It is common to experience some constipation after surgery.  Increasing fluid intake and taking a stool softener (Colace) will usually help or prevent this problem.  A mild laxative (Milk of Magnesia or Miralax) should be taken according to package directions if there has been no bowel movement after 48 hours.  You have steri-strips and a gauze dressing over your incision.  You may remove the gauze bandage on the second day after surgery, and you may shower at that time.  Leave your steri-strips (small skin tapes) in place directly over the incision.  These strips should remain on the skin for 5-7 days and then be removed.  You may get them wet in the shower and pat them dry.  You may resume regular (light) daily activities beginning the next day (such as daily self-care, walking, climbing stairs) gradually increasing activities as tolerated.  You may have sexual intercourse when it is comfortable.  Refrain from  any heavy lifting or straining until approved by your doctor.  You may drive when you no longer are taking prescription pain medication, you can comfortably wear a seatbelt, and you can safely maneuver your car and apply brakes.  You should see your doctor in the office for a follow-up appointment approximately three weeks after your surgery.  Make sure that you call for this appointment within a day or two after you arrive home to insure a convenient appointment time.  WHEN TO CALL YOUR DOCTOR: -- Fever greater than 101.5 -- Inability to urinate -- Nausea and/or vomiting - persistent -- Extreme swelling or bruising -- Continued bleeding from incision -- Increased pain, redness, or drainage from the incision -- Difficulty swallowing or breathing -- Muscle cramping or spasms -- Numbness or tingling in hands or around lips  The clinic staff is available to answer your questions during regular business hours.  Please dont hesitate to call and ask to speak to one of the nurses if you have concerns.  [name], MD [facility] Office: [phone]    General Anesthesia, Adult, Care After This sheet gives you information about how to care for yourself after your procedure. Your health care provider may also give you more specific instructions. If you have problems or questions, contact your health care provider. What can I expect after the procedure? After the procedure, the following side effects are common:  Pain or discomfort at the IV site.  Nausea.  Vomiting.  Sore throat.  Trouble concentrating.  Feeling cold  or chills.  Weak or tired.  Sleepiness and fatigue.  Soreness and body aches. These side effects can affect parts of the body that were not involved in surgery. Follow these instructions at home:  For at least 24 hours after the procedure:  Have a responsible adult stay with you. It is important to have someone help care for you until you are  awake and alert.  Rest as needed.  Do not: ? Participate in activities in which you could fall or become injured. ? Drive. ? Use heavy machinery. ? Drink alcohol. ? Take sleeping pills or medicines that cause drowsiness. ? Make important decisions or sign legal documents. ? Take care of children on your own. Eating and drinking  Follow any instructions from your health care provider about eating or drinking restrictions.  When you feel hungry, start by eating small amounts of foods that are soft and easy to digest (bland), such as toast. Gradually return to your regular diet.  Drink enough fluid to keep your urine pale yellow.  If you vomit, rehydrate by drinking water, juice, or clear broth. General instructions  If you have sleep apnea, surgery and certain medicines can increase your risk for breathing problems. Follow instructions from your health care provider about wearing your sleep device: ? Anytime you are sleeping, including during daytime naps. ? While taking prescription pain medicines, sleeping medicines, or medicines that make you drowsy.  Return to your normal activities as told by your health care provider. Ask your health care provider what activities are safe for you.  Take over-the-counter and prescription medicines only as told by your health care provider.  If you smoke, do not smoke without supervision.  Keep all follow-up visits as told by your health care provider. This is important. Contact a health care provider if:  You have nausea or vomiting that does not get better with medicine.  You cannot eat or drink without vomiting.  You have pain that does not get better with medicine.  You are unable to pass urine.  You develop a skin rash.  You have a fever.  You have redness around your IV site that gets worse. Get help right away if:  You have difficulty breathing.  You have chest pain.  You have blood in your urine or stool, or you vomit  blood. Summary  After the procedure, it is common to have a sore throat or nausea. It is also common to feel tired.  Have a responsible adult stay with you for the first 24 hours after general anesthesia. It is important to have someone help care for you until you are awake and alert.  When you feel hungry, start by eating small amounts of foods that are soft and easy to digest (bland), such as toast. Gradually return to your regular diet.  Drink enough fluid to keep your urine pale yellow.  Return to your normal activities as told by your health care provider. Ask your health care provider what activities are safe for you. This information is not intended to replace advice given to you by your health care provider. Make sure you discuss any questions you have with your health care provider. Document Released: 08/27/2000 Document Revised: 05/24/2017 Document Reviewed: 01/04/2017 Elsevier Patient Education  2020 [employer].

## 2019-02-27 NOTE — Anesthesia Preprocedure Evaluation (Addendum)
Anesthesia Evaluation  Patient identified by MRN, date of birth, ID band Patient awake    Reviewed: Allergy & Precautions, NPO status , Patient's Chart, lab work & pertinent test results  Airway Mallampati: I  TM Distance: >3 FB Neck ROM: Full    Dental no notable dental hx. (+) Teeth Intact   Pulmonary neg pulmonary ROS,    Pulmonary exam normal breath sounds clear to auscultation       Cardiovascular hypertension, Pt. on medications Normal cardiovascular exam Rhythm:Regular Rate:Normal  EKG 08/01/2018 NSR RBBB pattern   Neuro/Psych Anxiety Peripheral neuropathy- feet Restless legs syndrome  Neuromuscular disease    GI/Hepatic negative GI ROS, Neg liver ROS,   Endo/Other  diabetes, Well Controlled, Type 2, Oral Hypoglycemic AgentsHypercalcemia Primary hyperparathyroidism  Renal/GU Renal diseaseHx/o renal calculus  negative genitourinary   Musculoskeletal  (+) Fibromyalgia -  Abdominal   Peds  Hematology negative hematology ROS (+)   Anesthesia Other Findings   Reproductive/Obstetrics                           Anesthesia Physical Anesthesia Plan  ASA: II  Anesthesia Plan: General   Post-op Pain Management:    Induction: Intravenous  PONV Risk Score and Plan: 4 or greater and Ondansetron, Dexamethasone and Treatment may vary due to age or medical condition  Airway Management Planned: Oral ETT  Additional Equipment:   Intra-op Plan:   Post-operative Plan: Extubation in OR  Informed Consent: I have reviewed the patients History and Physical, chart, labs and discussed the procedure including the risks, benefits and alternatives for the proposed anesthesia with the patient or authorized representative who has indicated his/her understanding and acceptance.     Dental advisory given  Plan Discussed with: CRNA and Surgeon  Anesthesia Plan Comments:         Anesthesia  Quick Evaluation

## 2019-02-27 NOTE — Anesthesia Postprocedure Evaluation (Signed)
Anesthesia Post Note  Patient: Allison Oneill  Procedure(s) Performed: RIGHT INFERIOR PARATHYROIDECTOMY (Right Neck)     Patient location during evaluation: PACU Anesthesia Type: General Level of consciousness: awake and alert and oriented Pain management: pain level controlled Vital Signs Assessment: post-procedure vital signs reviewed and stable Respiratory status: spontaneous breathing, nonlabored ventilation and respiratory function stable Cardiovascular status: blood pressure returned to baseline and stable Postop Assessment: no apparent nausea or vomiting Anesthetic complications: no    Last Vitals:  Vitals:   02/27/19 1315 02/27/19 1326  BP: (!) 149/65 (!) 144/66  Pulse: 85 84  Resp: 13 12  Temp:  37 C  SpO2: 92% 95%    Last Pain:  Vitals:   02/27/19 1326  TempSrc:   PainSc: 0-No pain                 Ceylin Dreibelbis A.

## 2019-02-27 NOTE — Transfer of Care (Signed)
Immediate Anesthesia Transfer of Care Note  Patient: Allison Oneill  Procedure(s) Performed: RIGHT INFERIOR PARATHYROIDECTOMY (Right Neck)  Patient Location: PACU  Anesthesia Type:General  Level of Consciousness: awake, alert  and oriented  Airway & Oxygen Therapy: Patient Spontanous Breathing and Patient connected to face mask oxygen  Post-op Assessment: Report given to RN and Post -op Vital signs reviewed and stable  Post vital signs: Reviewed and stable  Last Vitals:  Vitals Value Taken Time  BP 160/72 02/27/19 1247  Temp    Pulse 90 02/27/19 1250  Resp 13 02/27/19 1250  SpO2 94 % 02/27/19 1250  Vitals shown include unvalidated device data.  Last Pain:  Vitals:   02/27/19 1054  TempSrc: Oral         Complications: No apparent anesthesia complications

## 2019-02-28 ENCOUNTER — Encounter (HOSPITAL_COMMUNITY): Payer: Self-pay | Admitting: Surgery

## 2019-03-02 LAB — SURGICAL PATHOLOGY

## 2019-03-11 DIAGNOSIS — Z882 Allergy status to sulfonamides status: Secondary | ICD-10-CM | POA: Diagnosis not present

## 2019-03-11 DIAGNOSIS — Z7984 Long term (current) use of oral hypoglycemic drugs: Secondary | ICD-10-CM | POA: Diagnosis not present

## 2019-03-11 DIAGNOSIS — Z9889 Other specified postprocedural states: Secondary | ICD-10-CM | POA: Diagnosis not present

## 2019-03-11 DIAGNOSIS — M797 Fibromyalgia: Secondary | ICD-10-CM | POA: Diagnosis not present

## 2019-03-11 DIAGNOSIS — Z79899 Other long term (current) drug therapy: Secondary | ICD-10-CM | POA: Diagnosis not present

## 2019-03-11 DIAGNOSIS — M545 Low back pain: Secondary | ICD-10-CM | POA: Diagnosis not present

## 2019-03-11 DIAGNOSIS — S161XXA Strain of muscle, fascia and tendon at neck level, initial encounter: Secondary | ICD-10-CM | POA: Diagnosis not present

## 2019-03-11 DIAGNOSIS — M810 Age-related osteoporosis without current pathological fracture: Secondary | ICD-10-CM | POA: Diagnosis not present

## 2019-03-11 DIAGNOSIS — G629 Polyneuropathy, unspecified: Secondary | ICD-10-CM | POA: Diagnosis not present

## 2019-03-11 DIAGNOSIS — M542 Cervicalgia: Secondary | ICD-10-CM | POA: Diagnosis not present

## 2019-03-11 DIAGNOSIS — M1990 Unspecified osteoarthritis, unspecified site: Secondary | ICD-10-CM | POA: Diagnosis not present

## 2019-03-11 DIAGNOSIS — E7800 Pure hypercholesterolemia, unspecified: Secondary | ICD-10-CM | POA: Diagnosis not present

## 2019-03-12 DIAGNOSIS — E210 Primary hyperparathyroidism: Secondary | ICD-10-CM | POA: Diagnosis not present

## 2019-04-03 ENCOUNTER — Ambulatory Visit: Payer: Medicare Other | Admitting: "Endocrinology

## 2019-04-03 DIAGNOSIS — I10 Essential (primary) hypertension: Secondary | ICD-10-CM | POA: Diagnosis not present

## 2019-04-03 DIAGNOSIS — E782 Mixed hyperlipidemia: Secondary | ICD-10-CM | POA: Diagnosis not present

## 2019-04-06 DIAGNOSIS — N200 Calculus of kidney: Secondary | ICD-10-CM | POA: Diagnosis not present

## 2019-04-06 DIAGNOSIS — E1140 Type 2 diabetes mellitus with diabetic neuropathy, unspecified: Secondary | ICD-10-CM | POA: Diagnosis not present

## 2019-04-06 DIAGNOSIS — R300 Dysuria: Secondary | ICD-10-CM | POA: Diagnosis not present

## 2019-04-06 DIAGNOSIS — G4733 Obstructive sleep apnea (adult) (pediatric): Secondary | ICD-10-CM | POA: Diagnosis not present

## 2019-04-06 DIAGNOSIS — G619 Inflammatory polyneuropathy, unspecified: Secondary | ICD-10-CM | POA: Diagnosis not present

## 2019-04-28 ENCOUNTER — Ambulatory Visit (INDEPENDENT_AMBULATORY_CARE_PROVIDER_SITE_OTHER): Payer: Medicare Other | Admitting: Urology

## 2019-04-28 DIAGNOSIS — N200 Calculus of kidney: Secondary | ICD-10-CM | POA: Diagnosis not present

## 2019-05-04 ENCOUNTER — Other Ambulatory Visit: Payer: Self-pay

## 2019-05-04 DIAGNOSIS — I10 Essential (primary) hypertension: Secondary | ICD-10-CM | POA: Diagnosis not present

## 2019-05-04 DIAGNOSIS — E1165 Type 2 diabetes mellitus with hyperglycemia: Secondary | ICD-10-CM | POA: Diagnosis not present

## 2019-05-04 DIAGNOSIS — E782 Mixed hyperlipidemia: Secondary | ICD-10-CM | POA: Diagnosis not present

## 2019-05-04 NOTE — Patient Outreach (Signed)
[facility]) Care Management  05/04/2019  Allison Oneill 1951/05/26 [id number]   Medication Adherence call to [name] Compliant Voice message left with a call back number. [name] is showing past due on Metformin Er 500 mg and Glipizide Er 10 mg under [place].   [facility] Management Direct Dial [phone]  Fax [phone] [email or web address]

## 2019-05-07 ENCOUNTER — Other Ambulatory Visit: Payer: Self-pay

## 2019-05-07 NOTE — Patient Outreach (Signed)
[facility]) Care Management  05/07/2019  Allison Oneill [birth year] [id number]   Medication Adherence call to Allison Oneill [facility] Identifiers Verify spoke with patient she is past due on Metformin Er 500 mg and Glipizide Er 10 mg.patient explain she takes 1 tablet daily,patient has medication at this time,patient explain she has medication from prior fills,patient has enough until the end of the year.Allison Oneill is showing past due under [place].   [place] Management Direct Dial [phone]  Fax [phone] [email or web address]

## 2019-06-04 DIAGNOSIS — I10 Essential (primary) hypertension: Secondary | ICD-10-CM | POA: Diagnosis not present

## 2019-06-04 DIAGNOSIS — E782 Mixed hyperlipidemia: Secondary | ICD-10-CM | POA: Diagnosis not present

## 2019-06-09 DIAGNOSIS — I739 Peripheral vascular disease, unspecified: Secondary | ICD-10-CM | POA: Diagnosis not present

## 2019-06-09 DIAGNOSIS — L609 Nail disorder, unspecified: Secondary | ICD-10-CM | POA: Diagnosis not present

## 2019-06-09 DIAGNOSIS — L110 Acquired keratosis follicularis: Secondary | ICD-10-CM | POA: Diagnosis not present

## 2019-06-13 DIAGNOSIS — M810 Age-related osteoporosis without current pathological fracture: Secondary | ICD-10-CM | POA: Diagnosis not present

## 2019-06-13 DIAGNOSIS — R739 Hyperglycemia, unspecified: Secondary | ICD-10-CM | POA: Diagnosis not present

## 2019-06-13 DIAGNOSIS — Z882 Allergy status to sulfonamides status: Secondary | ICD-10-CM | POA: Diagnosis not present

## 2019-06-13 DIAGNOSIS — R519 Headache, unspecified: Secondary | ICD-10-CM | POA: Diagnosis not present

## 2019-06-13 DIAGNOSIS — E7800 Pure hypercholesterolemia, unspecified: Secondary | ICD-10-CM | POA: Diagnosis not present

## 2019-06-13 DIAGNOSIS — I10 Essential (primary) hypertension: Secondary | ICD-10-CM | POA: Diagnosis not present

## 2019-06-13 DIAGNOSIS — Z79899 Other long term (current) drug therapy: Secondary | ICD-10-CM | POA: Diagnosis not present

## 2019-07-03 DIAGNOSIS — I10 Essential (primary) hypertension: Secondary | ICD-10-CM | POA: Diagnosis not present

## 2019-07-03 DIAGNOSIS — N200 Calculus of kidney: Secondary | ICD-10-CM | POA: Diagnosis not present

## 2019-07-03 DIAGNOSIS — Z0001 Encounter for general adult medical examination with abnormal findings: Secondary | ICD-10-CM | POA: Diagnosis not present

## 2019-07-03 DIAGNOSIS — G619 Inflammatory polyneuropathy, unspecified: Secondary | ICD-10-CM | POA: Diagnosis not present

## 2019-07-03 DIAGNOSIS — E7849 Other hyperlipidemia: Secondary | ICD-10-CM | POA: Diagnosis not present

## 2019-07-03 DIAGNOSIS — R300 Dysuria: Secondary | ICD-10-CM | POA: Diagnosis not present

## 2019-07-03 DIAGNOSIS — E1140 Type 2 diabetes mellitus with diabetic neuropathy, unspecified: Secondary | ICD-10-CM | POA: Diagnosis not present

## 2019-07-03 DIAGNOSIS — E782 Mixed hyperlipidemia: Secondary | ICD-10-CM | POA: Diagnosis not present

## 2019-07-06 ENCOUNTER — Telehealth: Payer: Self-pay | Admitting: Urology

## 2019-07-06 NOTE — Telephone Encounter (Signed)
This patient called and states she has appt here in 11/04/2019. However, she has passed 2 stones over the weekend and has been in pain ans nausea along with this. She states her bladder is having spasms also. She wants to speak with a nurse to be advised on the situation.

## 2019-07-07 ENCOUNTER — Other Ambulatory Visit: Payer: Self-pay | Admitting: Urology

## 2019-07-07 ENCOUNTER — Ambulatory Visit (HOSPITAL_COMMUNITY)
Admission: RE | Admit: 2019-07-07 | Discharge: 2019-07-07 | Disposition: A | Discharge status: Home / Self Care | Payer: Medicare Other | Source: Ambulatory Visit | Attending: Urology | Admitting: Urology

## 2019-07-07 ENCOUNTER — Other Ambulatory Visit: Payer: Self-pay

## 2019-07-07 ENCOUNTER — Encounter (HOSPITAL_COMMUNITY): Payer: Self-pay

## 2019-07-07 DIAGNOSIS — N200 Calculus of kidney: Secondary | ICD-10-CM | POA: Diagnosis not present

## 2019-07-07 DIAGNOSIS — N202 Calculus of kidney with calculus of ureter: Secondary | ICD-10-CM

## 2019-07-07 NOTE — Telephone Encounter (Signed)
Left message to return call 

## 2019-07-07 NOTE — Telephone Encounter (Signed)
Would you like to order image- I don't see any ordered in Epic for her upcoming appt.

## 2019-07-07 NOTE — Telephone Encounter (Signed)
I put kub order in

## 2019-07-07 NOTE — Telephone Encounter (Signed)
Pt notified will go over to [facility]ology for KUB

## 2019-07-10 ENCOUNTER — Telehealth: Payer: Self-pay

## 2019-07-10 NOTE — Telephone Encounter (Signed)
-----   Message from [name], MD sent at 07/09/2019  6:46 AM EST ----- Call pt--I looked @ xr and could not see any stone passing--just small cluster of stones in left kidney

## 2019-07-10 NOTE — Telephone Encounter (Signed)
Pt called back and given xray results

## 2019-07-31 DIAGNOSIS — I10 Essential (primary) hypertension: Secondary | ICD-10-CM | POA: Diagnosis not present

## 2019-07-31 DIAGNOSIS — E7849 Other hyperlipidemia: Secondary | ICD-10-CM | POA: Diagnosis not present

## 2019-08-06 DIAGNOSIS — L609 Nail disorder, unspecified: Secondary | ICD-10-CM | POA: Diagnosis not present

## 2019-08-06 DIAGNOSIS — I739 Peripheral vascular disease, unspecified: Secondary | ICD-10-CM | POA: Diagnosis not present

## 2019-08-06 DIAGNOSIS — L110 Acquired keratosis follicularis: Secondary | ICD-10-CM | POA: Diagnosis not present

## 2019-09-02 DIAGNOSIS — E7849 Other hyperlipidemia: Secondary | ICD-10-CM | POA: Diagnosis not present

## 2019-09-02 DIAGNOSIS — I10 Essential (primary) hypertension: Secondary | ICD-10-CM | POA: Diagnosis not present

## 2019-09-16 DIAGNOSIS — E213 Hyperparathyroidism, unspecified: Secondary | ICD-10-CM | POA: Diagnosis not present

## 2019-09-16 DIAGNOSIS — E782 Mixed hyperlipidemia: Secondary | ICD-10-CM | POA: Diagnosis not present

## 2019-09-16 DIAGNOSIS — E1165 Type 2 diabetes mellitus with hyperglycemia: Secondary | ICD-10-CM | POA: Diagnosis not present

## 2019-09-16 DIAGNOSIS — E7800 Pure hypercholesterolemia, unspecified: Secondary | ICD-10-CM | POA: Diagnosis not present

## 2019-09-16 DIAGNOSIS — I10 Essential (primary) hypertension: Secondary | ICD-10-CM | POA: Diagnosis not present

## 2019-09-21 DIAGNOSIS — R0789 Other chest pain: Secondary | ICD-10-CM | POA: Diagnosis not present

## 2019-09-21 DIAGNOSIS — I10 Essential (primary) hypertension: Secondary | ICD-10-CM | POA: Diagnosis not present

## 2019-09-21 DIAGNOSIS — G619 Inflammatory polyneuropathy, unspecified: Secondary | ICD-10-CM | POA: Diagnosis not present

## 2019-09-21 DIAGNOSIS — E1140 Type 2 diabetes mellitus with diabetic neuropathy, unspecified: Secondary | ICD-10-CM | POA: Diagnosis not present

## 2019-09-21 DIAGNOSIS — E782 Mixed hyperlipidemia: Secondary | ICD-10-CM | POA: Diagnosis not present

## 2019-09-25 DIAGNOSIS — R079 Chest pain, unspecified: Secondary | ICD-10-CM | POA: Diagnosis not present

## 2019-09-25 DIAGNOSIS — E119 Type 2 diabetes mellitus without complications: Secondary | ICD-10-CM | POA: Diagnosis not present

## 2019-09-25 DIAGNOSIS — Z8249 Family history of ischemic heart disease and other diseases of the circulatory system: Secondary | ICD-10-CM | POA: Diagnosis not present

## 2019-09-25 DIAGNOSIS — R0789 Other chest pain: Secondary | ICD-10-CM | POA: Diagnosis not present

## 2019-10-02 DIAGNOSIS — I10 Essential (primary) hypertension: Secondary | ICD-10-CM | POA: Diagnosis not present

## 2019-10-02 DIAGNOSIS — E7849 Other hyperlipidemia: Secondary | ICD-10-CM | POA: Diagnosis not present

## 2019-10-26 ENCOUNTER — Other Ambulatory Visit: Payer: Self-pay | Admitting: Urology

## 2019-10-26 DIAGNOSIS — N202 Calculus of kidney with calculus of ureter: Secondary | ICD-10-CM

## 2019-10-27 ENCOUNTER — Encounter: Payer: Self-pay | Admitting: Urology

## 2019-10-27 ENCOUNTER — Ambulatory Visit (INDEPENDENT_AMBULATORY_CARE_PROVIDER_SITE_OTHER): Payer: Medicare Other | Admitting: Urology

## 2019-10-27 ENCOUNTER — Telehealth: Payer: Self-pay

## 2019-10-27 ENCOUNTER — Other Ambulatory Visit: Payer: Self-pay

## 2019-10-27 ENCOUNTER — Other Ambulatory Visit (HOSPITAL_COMMUNITY)
Admission: RE | Admit: 2019-10-27 | Discharge: 2019-10-27 | Disposition: A | Discharge status: Home / Self Care | Payer: Medicare Other | Source: Other Acute Inpatient Hospital | Attending: Urology | Admitting: Urology

## 2019-10-27 ENCOUNTER — Ambulatory Visit (HOSPITAL_COMMUNITY)
Admission: RE | Admit: 2019-10-27 | Discharge: 2019-10-27 | Disposition: A | Discharge status: Home / Self Care | Payer: Medicare Other | Source: Ambulatory Visit | Attending: Urology | Admitting: Urology

## 2019-10-27 VITALS — BP 117/68 | HR 64 | Temp 97.3°F | Wt 165.0 lb

## 2019-10-27 DIAGNOSIS — N202 Calculus of kidney with calculus of ureter: Secondary | ICD-10-CM

## 2019-10-27 DIAGNOSIS — N200 Calculus of kidney: Secondary | ICD-10-CM | POA: Diagnosis not present

## 2019-10-27 LAB — POCT URINALYSIS DIPSTICK
Bilirubin, UA: NEGATIVE
Blood, UA: 2
Glucose, UA: NEGATIVE
Ketones, UA: NEGATIVE
Leukocytes, UA: NEGATIVE
Nitrite, UA: NEGATIVE
Protein, UA: NEGATIVE
Spec Grav, UA: 1.02 (ref 1.010–1.025)
Urobilinogen, UA: 0.2 E.U./dL
pH, UA: 5 (ref 5.0–8.0)

## 2019-10-27 NOTE — Telephone Encounter (Signed)
-----   Message from [name], MD sent at 10/26/2019  5:54 PM EDT -----   Yes, I put an order in ----- Message ----- From: Dorisann Frames, RN Sent: 10/26/2019   9:53 AM EDT To: [name], MD  Pt had a KUB in February. Do you want another one before tomorrow's OV?

## 2019-10-27 NOTE — Progress Notes (Signed)
See notes

## 2019-10-27 NOTE — Telephone Encounter (Signed)
Pt called and will go by radiology prior to office visit today for KUB,

## 2019-10-27 NOTE — Progress Notes (Signed)
H&P  Chief Complaint: Kidney Stones  History of Present Illness:  5.25.2021: Allison Oneill is a 69 y.o. year old female who returns today for follow-up w/ KUB. Of note, since last seen per our office she reports having passed two kidney stones -- she was severely symptomatic with flank pain and nausea. These stones were passed consecutively but she cannot recall which side she was passing them from.    (below copied from [facility] records):  Allison Oneill is a 69 year-old female established patient who is here for renal calculi after a surgical intervention.  The problem is on both sides. She had percutaneous lithotomy for treatment of her renal calculi. Patient denies stent, ureteroscopy, and eswl. This procedure was done 08/07/2018. She does have a stent in place.   Originally sent by Dr. Pleas Koch for evaluation and management of a left renal stone. She has prior history of urolithiasis, having had at least 2 prior procedures for left-sided stones by Dr. Michela Pitcher. She was found to have a large left renal stone. She had back x-rays done for right-sided pain. She has persistent intermittent right-sided pain worsened and relieved by positional change. She has some history of hematuria. No frequent urinary tract infections, fevers, shakes or chills.   1.28.2020: She had recent CT scan which confirmed a large left renal pelvic stone and smaller left lower pole stones. Craniocaudal diameter of the larger stone is 21-22 mm. She has had intermittent left flank pain but no gross hematuria.   3.5.2020: Underwent left PCNL--tubeless w/ J2 stent placed, discharged on POD 1.   5.19.2020: A recent renal ultrasound revealed normal-appearing bladder, no hydronephrosis or mass in either kidney. 2-3 small calculi left lower pole, 2 right renal calculi, all 6 mm in diameter or smaller. She has had no gross hematuria or flank pain. The a the she states that she did the 24 hour urine collection that I ordered, but I have  not received the results. She did have a serum calcium of 10.8 in January of this year. She did say that she had a parathyroid hormone test done.   04/28/2019: She passed a small stone 2 weeks ago. She has a parthyroidectomy with Dr. Harlow Asa recently. No flank pain currently.    Past Medical History:  Diagnosis Date  . Anxiety   . Diabetes mellitus without complication ([place])    TYPE 2  . Fibromyalgia   . Hypertension   . Left renal stone   . Neuromuscular disorder ([facility])    restless leg syndrome  . Neuropathy    FEET    Past Surgical History:  Procedure Laterality Date  . ABDOMINAL HYSTERECTOMY  2000   COMPLETE  . CYSTOSCOPY N/A 08/27/2013   Procedure: CYSTOSCOPY; REMOVAL OF BLADDER CALCULUS;  Surgeon: [name], MD;  Location: [facility];  Service: Urology;  Laterality: N/A;  . CYSTOSCOPY W/ URETERAL STENT PLACEMENT N/A 2012  . CYSTOSCOPY W/ URETERAL STENT REMOVAL N/A 2013  . IR URETERAL STENT LEFT NEW ACCESS W/O SEP NEPHROSTOMY CATH  08/07/2018  . LITHOTRIPSY  2012  . NEPHROLITHOTOMY Left 08/07/2018   Procedure: NEPHROLITHOTOMY PERCUTANEOUS;  Surgeon: [name], MD;  Location: [facility];  Service: Urology;  Laterality: Left;  . NEUROMA SURGERY Left 1997  . PARATHYROIDECTOMY Right 02/27/2019   Procedure: RIGHT INFERIOR PARATHYROIDECTOMY;  Surgeon: [name], MD;  Location: [facility];  Service: General;  Laterality: Right;    Home Medications:  (Not in a hospital admission)   Allergies:  Allergies  Allergen Reactions  . Sulfa Antibiotics Itching and Rash    No family history on file.  Social History:  reports that she has never smoked. She has never used smokeless tobacco. She reports that she does not drink alcohol or use drugs.  ROS: Urological Symptom Review  Patient is experiencing the following symptoms: none  Review of Systems Gastrointestinal (upper)  : Negative for upper GI symptoms Gastrointestinal (lower) : Negative for lower GI  symptoms Constitutional : Negative for symptoms Skin: Negative for skin symptoms Eyes: Negative for eye symptoms Ear/Nose/Throat : Negative for Ear/Nose/Throat symptoms Hematologic/Lymphatic: Negative for Hematologic/Lymphatic symptoms Cardiovascular : Negative for cardiovascular symptoms Respiratory : Negative for respiratory symptoms Endocrine: Negative for endocrine symptoms Musculoskeletal: Negative for musculoskeletal symptoms Neurological: Negative for neurological symptoms Psychologic: Negative for psychiatric symptoms  Physical Exam:  Vital signs in last 24 hours: Temp:  [97.3 F (36.3 C)] 97.3 F (36.3 C) (05/25 1027) Pulse Rate:  [64] 64 (05/25 1027) BP: (117)/(68) 117/68 (05/25 1027) Weight:  [165 lb (74.8 kg)] 165 lb (74.8 kg) (05/25 1027) General:  Alert and oriented, No acute distress HEENT: Normocephalic, atraumatic Cardiovascular: Regular rate  Extremities: No edema Neurologic: Grossly intact  Laboratory Data:  Results for orders placed or performed in visit on 10/27/19 (from the past 24 hour(s))  POCT urinalysis dipstick     Status: None   Collection Time: 10/27/19 10:34 AM  Result Value Ref Range   Color, UA yellow    Clarity, UA clear    Glucose, UA Negative Negative   Bilirubin, UA negative    Ketones, UA negative    Spec Grav, UA 1.020 1.010 - 1.025   Blood, UA 2    pH, UA 5.0 5.0 - 8.0   Protein, UA Negative Negative   Urobilinogen, UA 0.2 0.2 or 1.0 E.U./dL   Nitrite, UA negative    Leukocytes, UA Negative Negative   Appearance     Odor     I have reviewed prior pt notes  I have reviewed notes from referring/previous physicians  I have reviewed urinalysis results  I have independently reviewed prior imaging   Radiologic Imaging: KUB was obtained. I personally reviewed images. There are at least 2 calcifications overlying the left renal contour measuring approximately 8 and 10 mm in longest measurement. Mild scoliosis of the  lumbar spine. I see no calcifications overlying the right renal contour. Scattered calcifications in the pelvis represent phleboliths. Bowel gas pattern is normal.  Impression/Assessment:  KUB today does show some possible regrowth of stones on left side. Since last visit she passed two stones (possible from right side but cannot recall). Given her extensive hx and the speed at which she is growing these stones I think that metabolic evaluation would be a very important next step -- she reports having sent a 24 hr urine but we do not currently have records of this.   Plan:  1. 24 hr urine collection -- will forward results and advise on stone prevention pending results  2. Return in 3 mo for OV KUB  Dena Billet 10/27/2019, 10:49 AM  Lillette Boxer. Bradly Sangiovanni MD

## 2019-11-03 LAB — CALCULI, WITH PHOTOGRAPH (CLINICAL LAB)
Calcium Oxalate Monohydrate: 20 %
Uric Acid Calculi: 80 %
Weight Calculi: 140 mg

## 2019-11-04 DIAGNOSIS — N200 Calculus of kidney: Secondary | ICD-10-CM | POA: Diagnosis not present

## 2019-12-02 DIAGNOSIS — E7849 Other hyperlipidemia: Secondary | ICD-10-CM | POA: Diagnosis not present

## 2019-12-02 DIAGNOSIS — E1165 Type 2 diabetes mellitus with hyperglycemia: Secondary | ICD-10-CM | POA: Diagnosis not present

## 2019-12-02 DIAGNOSIS — Z7984 Long term (current) use of oral hypoglycemic drugs: Secondary | ICD-10-CM | POA: Diagnosis not present

## 2019-12-02 DIAGNOSIS — I10 Essential (primary) hypertension: Secondary | ICD-10-CM | POA: Diagnosis not present

## 2019-12-02 DIAGNOSIS — E213 Hyperparathyroidism, unspecified: Secondary | ICD-10-CM | POA: Diagnosis not present

## 2019-12-15 DIAGNOSIS — N200 Calculus of kidney: Secondary | ICD-10-CM | POA: Diagnosis not present

## 2019-12-21 DIAGNOSIS — E782 Mixed hyperlipidemia: Secondary | ICD-10-CM | POA: Diagnosis not present

## 2019-12-21 DIAGNOSIS — E1140 Type 2 diabetes mellitus with diabetic neuropathy, unspecified: Secondary | ICD-10-CM | POA: Diagnosis not present

## 2019-12-21 DIAGNOSIS — I10 Essential (primary) hypertension: Secondary | ICD-10-CM | POA: Diagnosis not present

## 2019-12-23 DIAGNOSIS — L648 Other androgenic alopecia: Secondary | ICD-10-CM | POA: Diagnosis not present

## 2019-12-23 DIAGNOSIS — D225 Melanocytic nevi of trunk: Secondary | ICD-10-CM | POA: Diagnosis not present

## 2019-12-23 DIAGNOSIS — C44629 Squamous cell carcinoma of skin of left upper limb, including shoulder: Secondary | ICD-10-CM | POA: Diagnosis not present

## 2019-12-23 DIAGNOSIS — B078 Other viral warts: Secondary | ICD-10-CM | POA: Diagnosis not present

## 2019-12-24 DIAGNOSIS — E782 Mixed hyperlipidemia: Secondary | ICD-10-CM | POA: Diagnosis not present

## 2019-12-24 DIAGNOSIS — E1140 Type 2 diabetes mellitus with diabetic neuropathy, unspecified: Secondary | ICD-10-CM | POA: Diagnosis not present

## 2019-12-24 DIAGNOSIS — I10 Essential (primary) hypertension: Secondary | ICD-10-CM | POA: Diagnosis not present

## 2020-01-01 DIAGNOSIS — E1165 Type 2 diabetes mellitus with hyperglycemia: Secondary | ICD-10-CM | POA: Diagnosis not present

## 2020-01-01 DIAGNOSIS — Z7984 Long term (current) use of oral hypoglycemic drugs: Secondary | ICD-10-CM | POA: Diagnosis not present

## 2020-01-01 DIAGNOSIS — I10 Essential (primary) hypertension: Secondary | ICD-10-CM | POA: Diagnosis not present

## 2020-01-01 DIAGNOSIS — E7849 Other hyperlipidemia: Secondary | ICD-10-CM | POA: Diagnosis not present

## 2020-01-04 DIAGNOSIS — H524 Presbyopia: Secondary | ICD-10-CM | POA: Diagnosis not present

## 2020-01-04 DIAGNOSIS — H5203 Hypermetropia, bilateral: Secondary | ICD-10-CM | POA: Diagnosis not present

## 2020-01-05 DIAGNOSIS — L609 Nail disorder, unspecified: Secondary | ICD-10-CM | POA: Diagnosis not present

## 2020-01-05 DIAGNOSIS — I739 Peripheral vascular disease, unspecified: Secondary | ICD-10-CM | POA: Diagnosis not present

## 2020-01-05 DIAGNOSIS — L110 Acquired keratosis follicularis: Secondary | ICD-10-CM | POA: Diagnosis not present

## 2020-01-20 ENCOUNTER — Other Ambulatory Visit (HOSPITAL_COMMUNITY): Payer: Self-pay | Admitting: Family Medicine

## 2020-01-20 DIAGNOSIS — Z1231 Encounter for screening mammogram for malignant neoplasm of breast: Secondary | ICD-10-CM

## 2020-01-25 ENCOUNTER — Other Ambulatory Visit: Payer: Self-pay

## 2020-01-25 ENCOUNTER — Ambulatory Visit (HOSPITAL_COMMUNITY)
Admission: RE | Admit: 2020-01-25 | Discharge: 2020-01-25 | Disposition: A | Discharge status: Home / Self Care | Payer: Medicare Other | Source: Ambulatory Visit | Attending: Urology | Admitting: Urology

## 2020-01-25 ENCOUNTER — Ambulatory Visit (HOSPITAL_COMMUNITY)
Admission: RE | Admit: 2020-01-25 | Discharge: 2020-01-25 | Disposition: A | Discharge status: Home / Self Care | Payer: Medicare Other | Source: Ambulatory Visit | Attending: Family Medicine | Admitting: Family Medicine

## 2020-01-25 DIAGNOSIS — N202 Calculus of kidney with calculus of ureter: Secondary | ICD-10-CM

## 2020-01-25 DIAGNOSIS — I878 Other specified disorders of veins: Secondary | ICD-10-CM | POA: Diagnosis not present

## 2020-01-25 DIAGNOSIS — N200 Calculus of kidney: Secondary | ICD-10-CM | POA: Diagnosis not present

## 2020-01-25 DIAGNOSIS — M47816 Spondylosis without myelopathy or radiculopathy, lumbar region: Secondary | ICD-10-CM | POA: Diagnosis not present

## 2020-01-25 DIAGNOSIS — Z1231 Encounter for screening mammogram for malignant neoplasm of breast: Secondary | ICD-10-CM

## 2020-01-25 DIAGNOSIS — Z85828 Personal history of other malignant neoplasm of skin: Secondary | ICD-10-CM | POA: Diagnosis not present

## 2020-01-25 DIAGNOSIS — Z08 Encounter for follow-up examination after completed treatment for malignant neoplasm: Secondary | ICD-10-CM | POA: Diagnosis not present

## 2020-01-28 DIAGNOSIS — H532 Diplopia: Secondary | ICD-10-CM | POA: Diagnosis not present

## 2020-01-28 DIAGNOSIS — H0261 Xanthelasma of right upper eyelid: Secondary | ICD-10-CM | POA: Diagnosis not present

## 2020-02-02 ENCOUNTER — Ambulatory Visit (INDEPENDENT_AMBULATORY_CARE_PROVIDER_SITE_OTHER): Payer: Medicare Other | Admitting: Urology

## 2020-02-02 ENCOUNTER — Other Ambulatory Visit: Payer: Self-pay

## 2020-02-02 ENCOUNTER — Encounter: Payer: Self-pay | Admitting: Urology

## 2020-02-02 VITALS — BP 116/61 | HR 65 | Temp 98.3°F | Ht 65.0 in | Wt 163.0 lb

## 2020-02-02 DIAGNOSIS — E7849 Other hyperlipidemia: Secondary | ICD-10-CM | POA: Diagnosis not present

## 2020-02-02 DIAGNOSIS — I10 Essential (primary) hypertension: Secondary | ICD-10-CM | POA: Diagnosis not present

## 2020-02-02 DIAGNOSIS — E1165 Type 2 diabetes mellitus with hyperglycemia: Secondary | ICD-10-CM | POA: Diagnosis not present

## 2020-02-02 DIAGNOSIS — N202 Calculus of kidney with calculus of ureter: Secondary | ICD-10-CM

## 2020-02-02 DIAGNOSIS — Z7984 Long term (current) use of oral hypoglycemic drugs: Secondary | ICD-10-CM | POA: Diagnosis not present

## 2020-02-02 LAB — URINALYSIS, ROUTINE W REFLEX MICROSCOPIC
Bilirubin, UA: NEGATIVE
Glucose, UA: NEGATIVE
Nitrite, UA: NEGATIVE
Protein,UA: NEGATIVE
Specific Gravity, UA: 1.025 (ref 1.005–1.030)
Urobilinogen, Ur: 0.2 mg/dL (ref 0.2–1.0)
pH, UA: 5 (ref 5.0–7.5)

## 2020-02-02 LAB — MICROSCOPIC EXAMINATION: Renal Epithel, UA: NONE SEEN /hpf

## 2020-02-02 MED ORDER — OXYBUTYNIN CHLORIDE 5 MG PO TABS: 5.0000 mg | ORAL_TABLET | Freq: Three times a day (TID) | ORAL | 1 refills | Status: DC

## 2020-02-02 NOTE — Progress Notes (Signed)

## 2020-02-02 NOTE — Progress Notes (Signed)
H&P  Chief Complaint: Renal Calculi F/U  History of Present Illness: Allison Oneill is a 69 y.o. year old female established pt who is here for renal calculi after a surgical intervention. Pt recently passed a kidney stone accompanied with gross hematuria and post-void bladder spasms.   (below copied from [facility] records):  Allison Oneill is a 69 year-old female established patient who is here for renal calculi after a surgical intervention.  The problem is on both sides. She had percutaneous lithotomy for treatment of her renal calculi. Patient denies stent, ureteroscopy, and eswl. This procedure was done 08/07/2018. She does have a stent in place.   Originally sent by Dr. Pleas Koch for evaluation and management of a left renal stone. She has prior history of urolithiasis, having had at least 2 prior procedures for left-sided stones by Dr. Michela Pitcher. She was found to have a large left renal stone. She had back x-rays done for right-sided pain. She has persistent intermittent right-sided pain worsened and relieved by positional change. She has some history of hematuria. No frequent urinary tract infections, fevers, shakes or chills.   1.28.2020: She had recent CT scan which confirmed a large left renal pelvic stone and smaller left lower pole stones. Craniocaudal diameter of the larger stone is 21-22 mm. She has had intermittent left flank pain but no gross hematuria.   3.5.2020: Underwent left PCNL--tubeless w/ J2 stent placed, discharged on POD 1.   5.19.2020: A recent renal ultrasound revealed normal-appearing bladder, no hydronephrosis or mass in either kidney. 2-3 small calculi left lower pole, 2 right renal calculi, all 6 mm in diameter or smaller. She has had no gross hematuria or flank pain. The a the she states that she did the 24 hour urine collection that I ordered, but I have not received the results. She did have a serum calcium of 10.8 in January of this year. She did say that she had a  parathyroid hormone test done.   04/28/2019: She passed a small stone 2 weeks ago. She has a parthyroidectomy with Dr. Harlow Asa recently. No flank pain currently.   5.25.2021: Allison Oneill is a 69 y.o. year old female who returns today for follow-up w/ KUB. Of note, since last seen per our office she reports having passed two kidney stones -- she was severely symptomatic with flank pain and nausea. These stones were passed consecutively but she cannot recall which side she was passing them from.    Past Medical History:  Diagnosis Date  . Anxiety   . Diabetes mellitus without complication ([name])    TYPE 2  . Fibromyalgia   . Hypertension   . Left renal stone   . Neuromuscular disorder (HCC)    restless leg syndrome  . Neuropathy    FEET    Past Surgical History:  Procedure Laterality Date  . ABDOMINAL HYSTERECTOMY  2000   COMPLETE  . CYSTOSCOPY N/A 08/27/2013   Procedure: CYSTOSCOPY; REMOVAL OF BLADDER CALCULUS;  Surgeon: [name], MD;  Location: [facility];  Service: Urology;  Laterality: N/A;  . CYSTOSCOPY W/ URETERAL STENT PLACEMENT N/A 2012  . CYSTOSCOPY W/ URETERAL STENT REMOVAL N/A 2013  . IR URETERAL STENT LEFT NEW ACCESS W/O SEP NEPHROSTOMY CATH  08/07/2018  . LITHOTRIPSY  2012  . NEPHROLITHOTOMY Left 08/07/2018   Procedure: NEPHROLITHOTOMY PERCUTANEOUS;  Surgeon: [name], MD;  Location: [facility];  Service: Urology;  Laterality: Left;  . NEUROMA SURGERY Left 1997  . PARATHYROIDECTOMY Right 02/27/2019  Procedure: RIGHT INFERIOR PARATHYROIDECTOMY;  Surgeon: [name], MD;  Location: [facility];  Service: General;  Laterality: Right;    Home Medications:  (Not in a hospital admission)   Allergies:  Allergies  Allergen Reactions  . Sulfa Antibiotics Itching and Rash    No family history on file.  Social History:  reports that she has never smoked. She has never used smokeless tobacco. She reports that she does not drink alcohol and does not use  drugs.  ROS: Urological Symptom Review  Patient is experiencing the following symptoms: None   Review of Systems  Gastrointestinal (upper)  : Negative for upper GI symptoms  Gastrointestinal (lower) : Negative for lower GI symptoms  Constitutional : Negative for symptoms  Skin: Negative for skin symptoms  Eyes: Negative for eye symptoms  Ear/Nose/Throat : Negative for Ear/Nose/Throat symptoms  Hematologic/Lymphatic: Negative for Hematologic/Lymphatic symptoms  Cardiovascular : Negative for cardiovascular symptoms  Respiratory : Negative for respiratory symptoms  Endocrine: Negative for endocrine symptoms  Musculoskeletal: Negative for musculoskeletal symptoms  Neurological: Negative for neurological symptoms  Psychologic: Negative for psychiatric symptoms   Physical Exam:  Vital signs in last 24 hours: BP: ()/()  Arterial Line BP: ()/()  General:  Alert and oriented, No acute distress HEENT: Normocephalic, atraumatic Neck: No JVD or lymphadenopathy Cardiovascular: Regular rate  Lungs: Normal inspiratory/expiratory excursion Extremities: No edema Neurologic: Grossly intact  I have reviewed prior pt notes  I have reviewed notes from referring/previous physicians  I have reviewed urinalysis/24-hour urine results.  24-hour urine results revealed very low volume at approximately 0.65 L/day, low citrate.  Normal calcium and oxalate.  I have independently reviewed prior imaging.  KUB reveals stable stone burden, left kidney greater than right   Impression/Assessment:  1.  Recurrent urolithiasis, stable KUB at present time  2.  Exceedingly low urinary citrate and volume  Plan:  1. Pt provided with stone formation brochure and encouraged to increase her daily water and citrate intake significantly.  2. Pt started on oxybutynin for bladder spasms when passing stones  3. 24 hour urine specimen to be collected in 3 months (1 month prior to next  ov).  4. F/U in 4 months for OV and KUB  CC: Dr. Judd Lien  Budd Palmer 02/02/2020, 10:00 AM  Lillette Boxer. Maddelynn Moosman MD

## 2020-02-04 DIAGNOSIS — I8310 Varicose veins of unspecified lower extremity with inflammation: Secondary | ICD-10-CM | POA: Diagnosis not present

## 2020-02-04 DIAGNOSIS — I871 Compression of vein: Secondary | ICD-10-CM | POA: Diagnosis not present

## 2020-02-28 IMAGING — US US RENAL
1 series · 14 of 25 positions shown · non-contrast
Comparison: CT abdomen/pelvis dated 06/17/2018

CLINICAL DATA: Renal calculi, status post cystoscopy and stent

EXAM:
RENAL / URINARY TRACT ULTRASOUND COMPLETE

[Series 1: us renal · 14 of 64 slices shown]
[im 1/64]
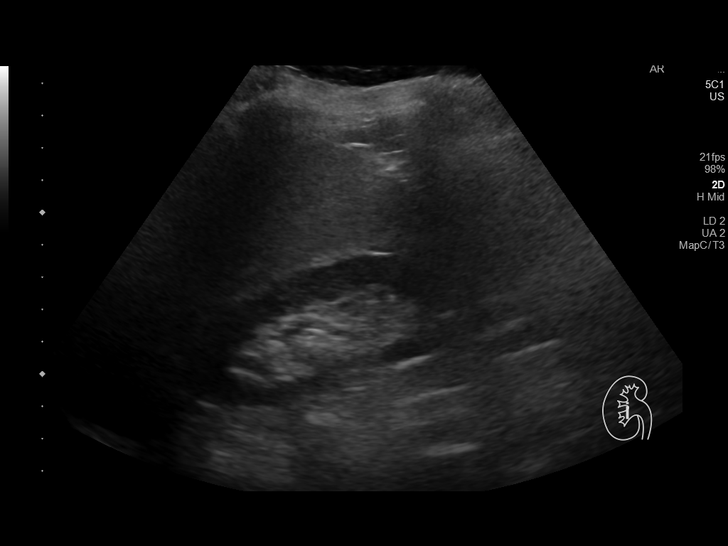
[im 6/64]
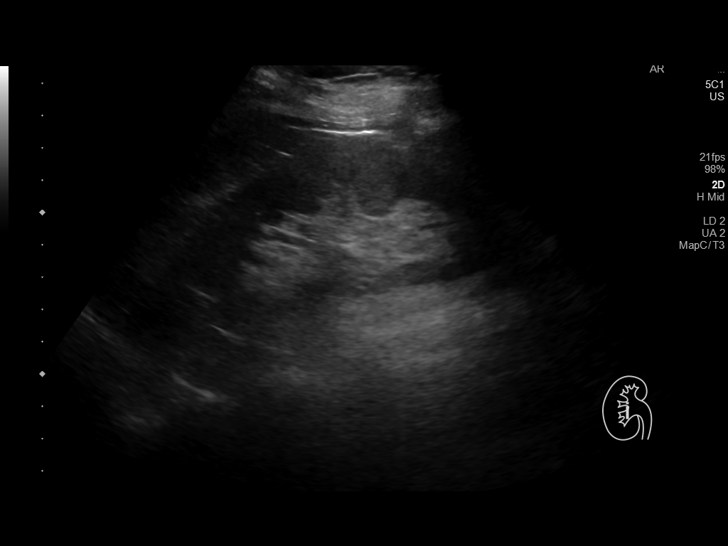
[im 11/64]
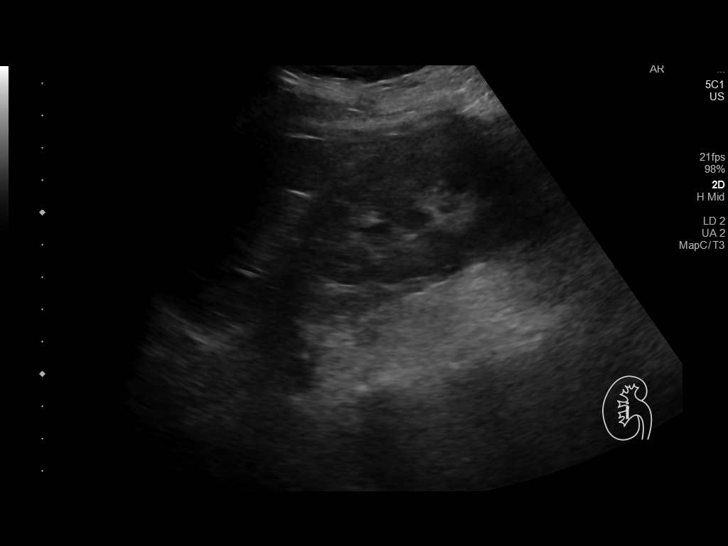
[im 16/64]
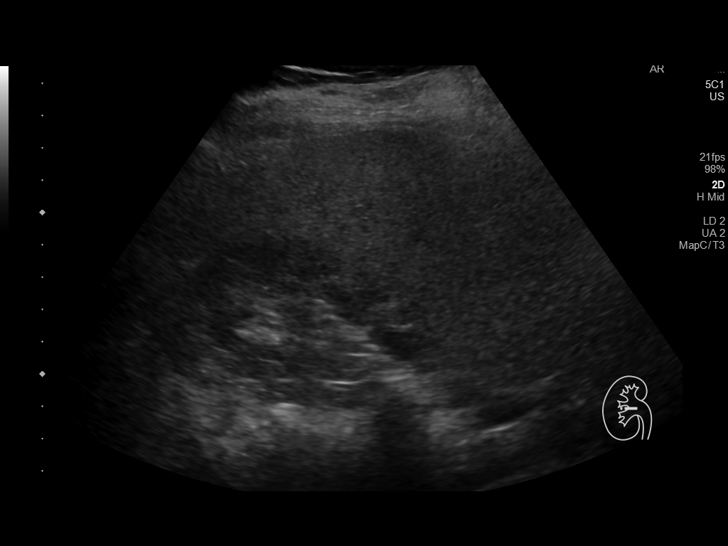
[im 22/64]
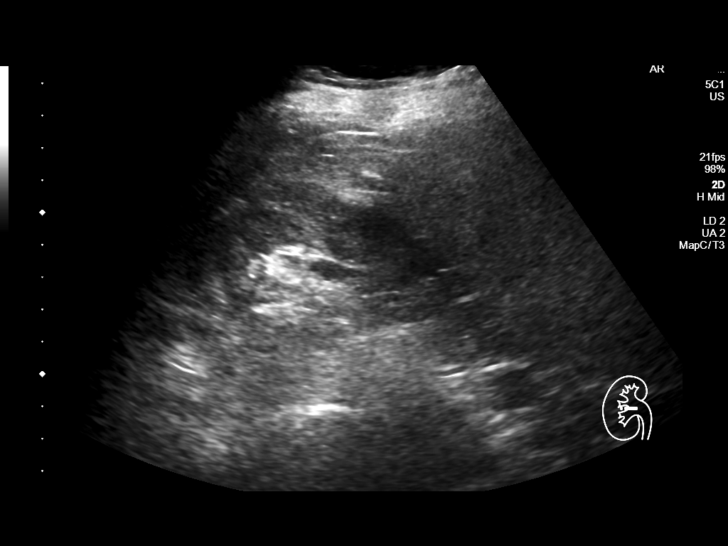
[im 24/64]
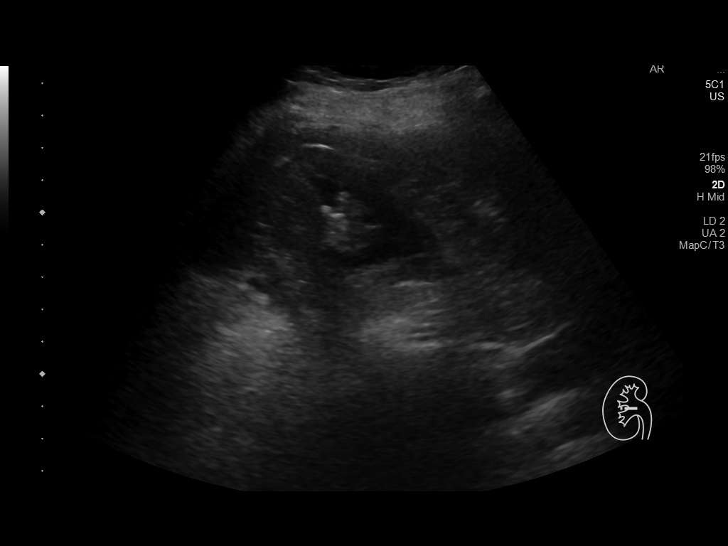
[im 29/64]
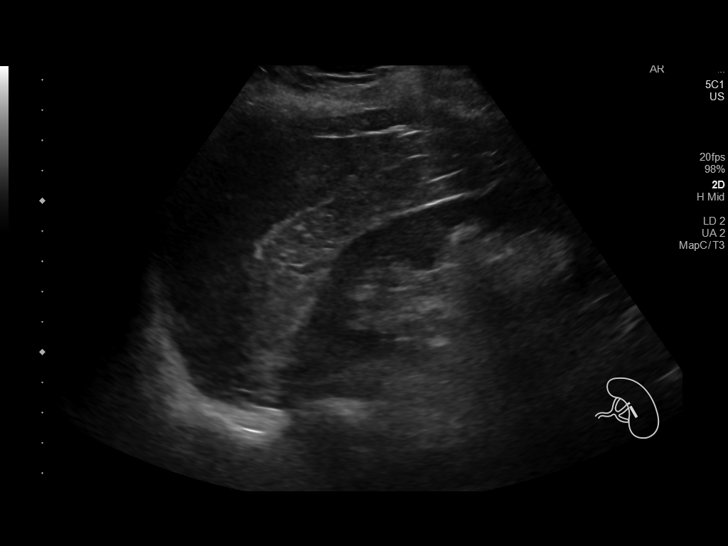
[im 35/64]
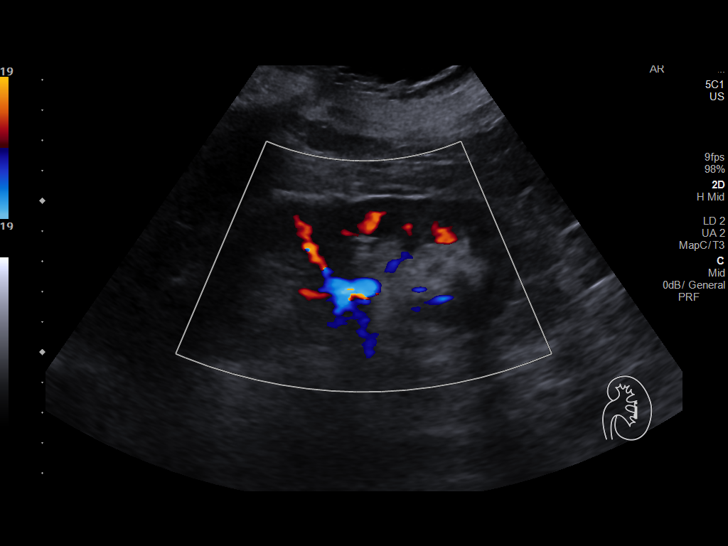
[im 40/64]
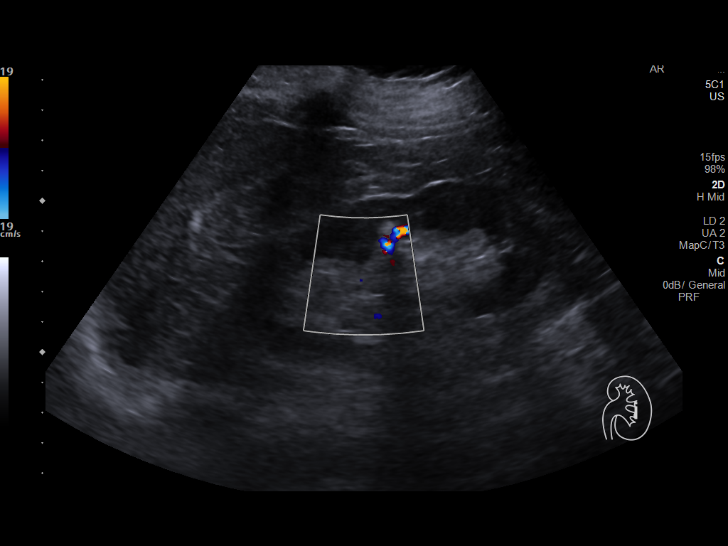
[im 43/64]
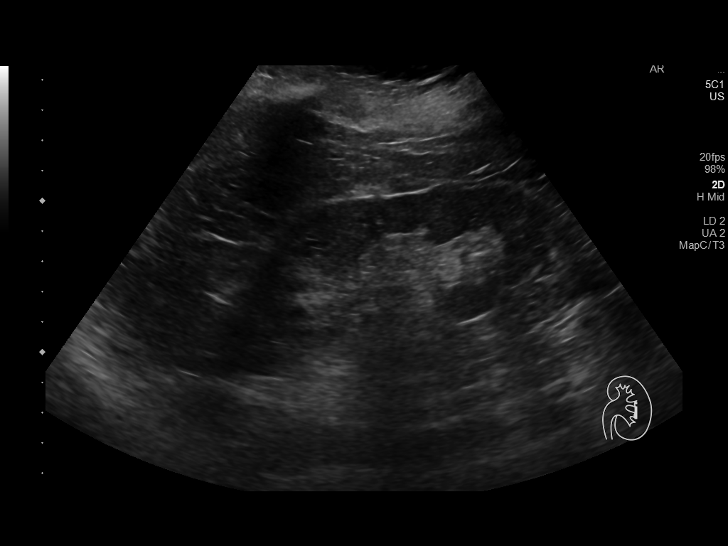
[im 48/64]
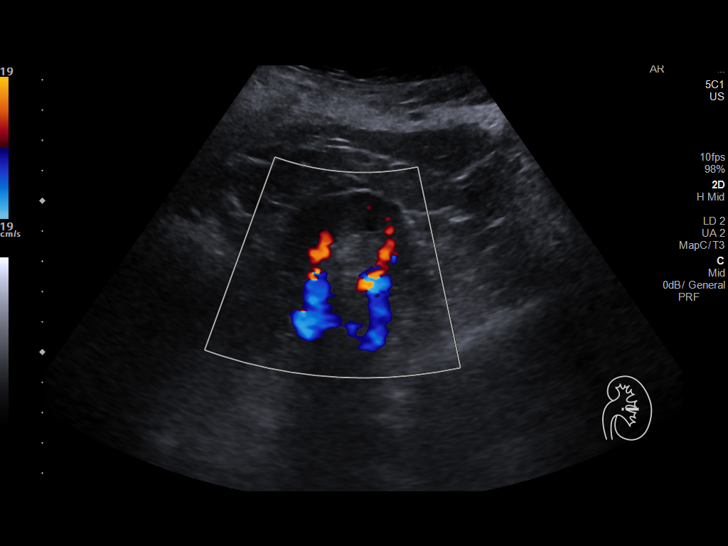
[im 53/64]
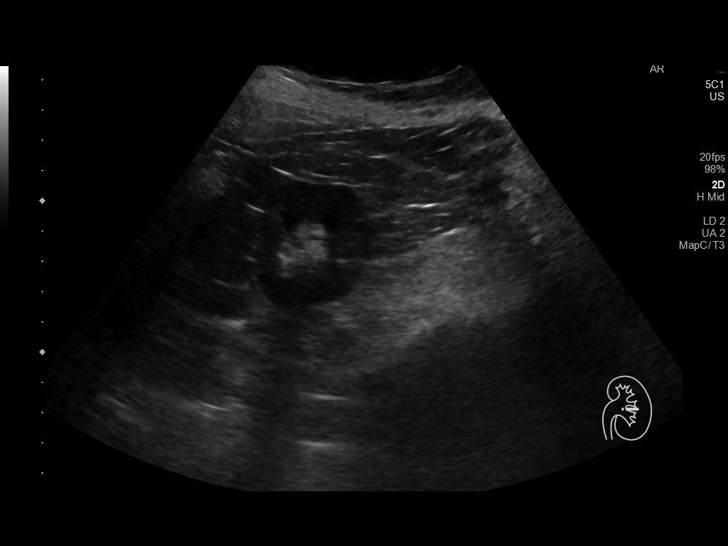
[im 58/64]
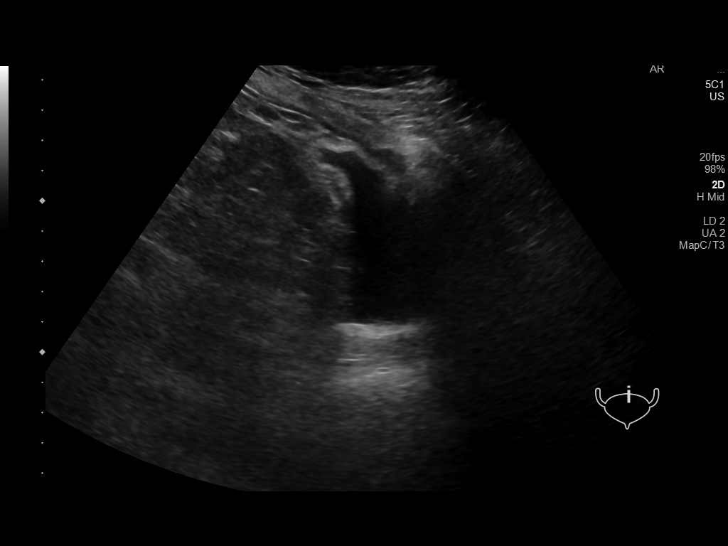
[im 64/64]
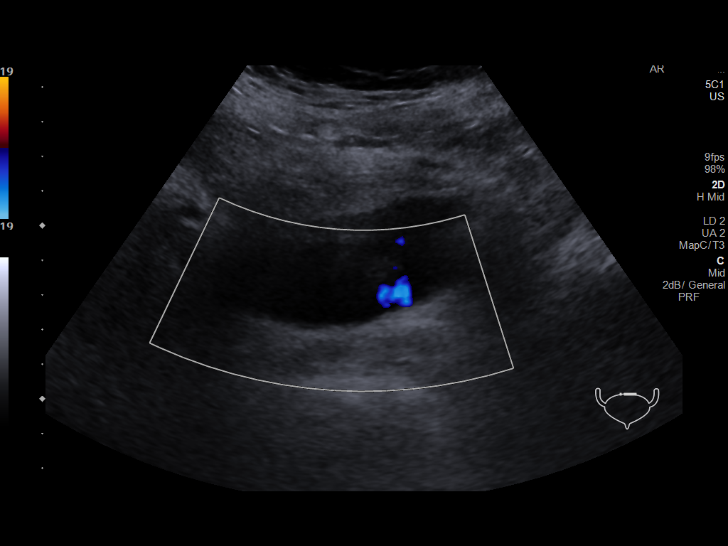

[14 of 25 positions shown; findings below may reference images not displayed]

FINDINGS: Right Kidney:

Renal measurements: 11.9 x 5.5 x 5.8 cm = volume: 191 mL. At least 3
nonobstructing calculi, measuring up to 6 mm in the lower pole. No
hydronephrosis.

Left Kidney:

Renal measurements: 11.1 x 5.9 x 4.9 cm = volume: 162 mL. At least 2
nonobstructing calculi, measuring up to 6 mm in the interpolar
region. No hydronephrosis.

Bladder:

Within normal limits.
IMPRESSION: Bilateral nonobstructing renal calculi, measuring up to 6 mm, as
above. No hydronephrosis.

## 2020-03-03 DIAGNOSIS — I10 Essential (primary) hypertension: Secondary | ICD-10-CM | POA: Diagnosis not present

## 2020-03-03 DIAGNOSIS — E1165 Type 2 diabetes mellitus with hyperglycemia: Secondary | ICD-10-CM | POA: Diagnosis not present

## 2020-03-03 DIAGNOSIS — Z7984 Long term (current) use of oral hypoglycemic drugs: Secondary | ICD-10-CM | POA: Diagnosis not present

## 2020-03-22 DIAGNOSIS — L609 Nail disorder, unspecified: Secondary | ICD-10-CM | POA: Diagnosis not present

## 2020-03-22 DIAGNOSIS — I739 Peripheral vascular disease, unspecified: Secondary | ICD-10-CM | POA: Diagnosis not present

## 2020-03-22 DIAGNOSIS — L110 Acquired keratosis follicularis: Secondary | ICD-10-CM | POA: Diagnosis not present

## 2020-04-02 DIAGNOSIS — E1165 Type 2 diabetes mellitus with hyperglycemia: Secondary | ICD-10-CM | POA: Diagnosis not present

## 2020-04-02 DIAGNOSIS — Z7984 Long term (current) use of oral hypoglycemic drugs: Secondary | ICD-10-CM | POA: Diagnosis not present

## 2020-04-02 DIAGNOSIS — I10 Essential (primary) hypertension: Secondary | ICD-10-CM | POA: Diagnosis not present

## 2020-04-15 DIAGNOSIS — M5431 Sciatica, right side: Secondary | ICD-10-CM | POA: Diagnosis not present

## 2020-04-15 DIAGNOSIS — M4316 Spondylolisthesis, lumbar region: Secondary | ICD-10-CM | POA: Diagnosis not present

## 2020-04-22 DIAGNOSIS — M62830 Muscle spasm of back: Secondary | ICD-10-CM | POA: Diagnosis not present

## 2020-04-22 DIAGNOSIS — M4316 Spondylolisthesis, lumbar region: Secondary | ICD-10-CM | POA: Diagnosis not present

## 2020-05-03 DIAGNOSIS — I10 Essential (primary) hypertension: Secondary | ICD-10-CM | POA: Diagnosis not present

## 2020-05-03 DIAGNOSIS — Z7984 Long term (current) use of oral hypoglycemic drugs: Secondary | ICD-10-CM | POA: Diagnosis not present

## 2020-05-03 DIAGNOSIS — E1165 Type 2 diabetes mellitus with hyperglycemia: Secondary | ICD-10-CM | POA: Diagnosis not present

## 2020-05-04 DIAGNOSIS — N200 Calculus of kidney: Secondary | ICD-10-CM | POA: Diagnosis not present

## 2020-05-09 DIAGNOSIS — M9903 Segmental and somatic dysfunction of lumbar region: Secondary | ICD-10-CM | POA: Diagnosis not present

## 2020-05-09 DIAGNOSIS — S338XXA Sprain of other parts of lumbar spine and pelvis, initial encounter: Secondary | ICD-10-CM | POA: Diagnosis not present

## 2020-05-11 DIAGNOSIS — S338XXA Sprain of other parts of lumbar spine and pelvis, initial encounter: Secondary | ICD-10-CM | POA: Diagnosis not present

## 2020-05-11 DIAGNOSIS — M9903 Segmental and somatic dysfunction of lumbar region: Secondary | ICD-10-CM | POA: Diagnosis not present

## 2020-05-13 DIAGNOSIS — M9903 Segmental and somatic dysfunction of lumbar region: Secondary | ICD-10-CM | POA: Diagnosis not present

## 2020-05-13 DIAGNOSIS — S338XXA Sprain of other parts of lumbar spine and pelvis, initial encounter: Secondary | ICD-10-CM | POA: Diagnosis not present

## 2020-05-17 ENCOUNTER — Encounter: Payer: Self-pay | Admitting: Urology

## 2020-05-17 ENCOUNTER — Ambulatory Visit (INDEPENDENT_AMBULATORY_CARE_PROVIDER_SITE_OTHER): Payer: Medicare Other | Admitting: Urology

## 2020-05-17 ENCOUNTER — Other Ambulatory Visit: Payer: Self-pay

## 2020-05-17 VITALS — BP 147/66 | HR 94 | Temp 99.1°F | Ht 65.0 in | Wt 165.0 lb

## 2020-05-17 DIAGNOSIS — N202 Calculus of kidney with calculus of ureter: Secondary | ICD-10-CM

## 2020-05-17 LAB — MICROSCOPIC EXAMINATION
Epithelial Cells (non renal): >10 /hpf — AB (ref 0–10)
Renal Epithel, UA: NONE SEEN /hpf

## 2020-05-17 LAB — URINALYSIS, ROUTINE W REFLEX MICROSCOPIC
Bilirubin, UA: NEGATIVE
Glucose, UA: NEGATIVE
Ketones, UA: NEGATIVE
Nitrite, UA: NEGATIVE
Protein,UA: NEGATIVE
Specific Gravity, UA: 1.025 (ref 1.005–1.030)
Urobilinogen, Ur: 0.2 mg/dL (ref 0.2–1.0)
pH, UA: 5 (ref 5.0–7.5)

## 2020-05-17 NOTE — Progress Notes (Signed)
Urological Symptom Review  Patient is experiencing the following symptoms: Get up at night to urinate   Review of Systems  Gastrointestinal (upper)  : Negative for upper GI symptoms  Gastrointestinal (lower) : Negative for lower GI symptoms  Constitutional : Negative for symptoms  Skin: Negative for skin symptoms  Eyes: Sinus problems  Ear/Nose/Throat : Sinus problems  Hematologic/Lymphatic: Negative for Hematologic/Lymphatic symptoms  Cardiovascular : Negative for cardiovascular symptoms  Respiratory : Negative for respiratory symptoms  Endocrine: Negative for endocrine symptoms  Musculoskeletal: Negative for musculoskeletal symptoms  Neurological: Negative for neurological symptoms  Psychologic: Negative for psychiatric symptoms

## 2020-05-17 NOTE — Progress Notes (Signed)
History of Present Illness: Allison Oneill is a 69 y.o. year old female followed for urolithiasis  12.14.2021: Pt reports recent sciatic pain but denies any recent kidney stone related symptoms. She denies any recent gross hematuria or bladder spasms.  She turned in a 24-hour urine last week but this has not been received by [place] yet.  (below copied from [facility] records):  Allison Oneill is a 69 year-old female established patient who is here for renal calculi after a surgical intervention.  The problem is on both sides. She had percutaneous lithotomy for treatment of her renal calculi. Patient denies stent, ureteroscopy, and eswl. This procedure was done 08/07/2018. She does have a stent in place.   Originally sent by Dr. Pleas Koch for evaluation and management of a left renal stone. She has prior history of urolithiasis, having had at least 2 prior procedures for left-sided stones by Dr. Michela Pitcher. She was found to have a large left renal stone. She had back x-rays done for right-sided pain. She has persistent intermittent right-sided pain worsened and relieved by positional change. She has some history of hematuria. No frequent urinary tract infections, fevers, shakes or chills.   1.28.2020: She had recent CT scan which confirmed a large left renal pelvic stone and smaller left lower pole stones. Craniocaudal diameter of the larger stone is 21-22 mm. She has had intermittent left flank pain but no gross hematuria.   3.5.2020: Underwent left PCNL--tubeless w/ J2 stent placed, discharged on POD 1.   5.19.2020: A recent renal ultrasound revealed normal-appearing bladder, no hydronephrosis or mass in either kidney. 2-3 small calculi left lower pole, 2 right renal calculi, all 6 mm in diameter or smaller. She has had no gross hematuria or flank pain. The a the she states that she did the 24 hour urine collection that I ordered, but I have not received the results. She did have a serum calcium of 10.8 in  January of this year. She did say that she had a parathyroid hormone test done.   04/28/2019: She passed a small stone 2 weeks ago. She has a parthyroidectomy with Dr. Harlow Asa recently. No flank pain currently.  5.25.2021:Allison A Thackeris a 69 y.o.year old female who returns today for follow-up w/ KUB. Of note, since last seen per our office she reports having passed two kidney stones -- she was severely symptomatic with flank pain and nausea. These stones were passed consecutively but she cannot recall which side she was passing them from.  08.31.2021: Allison Oneill is a 69 y.o. year old female established pt who is here for renal calculi after a surgical intervention. Pt recently passed a kidney stone accompanied with gross hematuria and post-void bladder spasms.  24-hour urine performed in June 2021 revealed low urinary citrate and exceedingly low volume.  It was recommended that she drink more water, add lemon or lime juice to this.   Past Medical History:  Diagnosis Date  . Anxiety   . Diabetes mellitus without complication ([place])    TYPE 2  . Fibromyalgia   . Hypertension   . Left renal stone   . Neuromuscular disorder (HCC)    restless leg syndrome  . Neuropathy    FEET    Past Surgical History:  Procedure Laterality Date  . ABDOMINAL HYSTERECTOMY  2000   COMPLETE  . CYSTOSCOPY N/A 08/27/2013   Procedure: CYSTOSCOPY; REMOVAL OF BLADDER CALCULUS;  Surgeon: [name], MD;  Location: [facility];  Service: Urology;  Laterality: N/A;  .  CYSTOSCOPY W/ URETERAL STENT PLACEMENT N/A 2012  . CYSTOSCOPY W/ URETERAL STENT REMOVAL N/A 2013  . IR URETERAL STENT LEFT NEW ACCESS W/O SEP NEPHROSTOMY CATH  08/07/2018  . LITHOTRIPSY  2012  . NEPHROLITHOTOMY Left 08/07/2018   Procedure: NEPHROLITHOTOMY PERCUTANEOUS;  Surgeon: [name], MD;  Location: [facility];  Service: Urology;  Laterality: Left;  . NEUROMA SURGERY Left 1997  . PARATHYROIDECTOMY Right 02/27/2019   Procedure:  RIGHT INFERIOR PARATHYROIDECTOMY;  Surgeon: [name], MD;  Location: [facility];  Service: General;  Laterality: Right;    Home Medications:  (Not in a hospital admission)   Allergies:  Allergies  Allergen Reactions  . Sulfa Antibiotics Itching and Rash    No family history on file.  Social History:  reports that she has never smoked. She has never used smokeless tobacco. She reports that she does not drink alcohol and does not use drugs.  ROS: A complete review of systems was performed.  All systems are negative except for pertinent findings as noted.  Physical Exam:  Vital signs in last 24 hours: BP: ()/()  Arterial Line BP: ()/()  General:  Alert and oriented, No acute distress HEENT: Normocephalic, atraumatic Neck: No JVD Extremities: No edema Neurologic: Grossly intact  I have reviewed prior pt notes  I have reviewed notes from referring/previous physicians  I have reviewed urinalysis results  I have independently reviewed prior imaging--KUB from 10/2019    Impression/Assessment:  History of Renal Calculi: Currently stable stone disease  Hypocitraturia  H/O hyperparathyroidism, s/p parathyroidectomy  Plan:  1. Pt advised to increase her water and citrate intake and reduce her salt/meat portion intake to reduce the formation of kidney stones.  2. Pt will f/u with KUB at next available appointment.  3. F/U 6 months for OV and KUB - Will f/u with her once 24 hour results are in.  CC: Dr. Pleas Koch & Dr. Duffy [facility])  Budd Palmer 05/17/2020, 1:00 PM  Lillette Boxer. Loetta Connelley MD

## 2020-05-18 ENCOUNTER — Ambulatory Visit (HOSPITAL_COMMUNITY)
Admission: RE | Admit: 2020-05-18 | Discharge: 2020-05-18 | Disposition: A | Discharge status: Home / Self Care | Payer: Medicare Other | Source: Ambulatory Visit | Attending: Urology | Admitting: Urology

## 2020-05-18 DIAGNOSIS — N202 Calculus of kidney with calculus of ureter: Secondary | ICD-10-CM | POA: Insufficient documentation

## 2020-05-18 DIAGNOSIS — N200 Calculus of kidney: Secondary | ICD-10-CM | POA: Diagnosis not present

## 2020-05-18 DIAGNOSIS — S338XXA Sprain of other parts of lumbar spine and pelvis, initial encounter: Secondary | ICD-10-CM | POA: Diagnosis not present

## 2020-05-18 DIAGNOSIS — M9903 Segmental and somatic dysfunction of lumbar region: Secondary | ICD-10-CM | POA: Diagnosis not present

## 2020-05-18 DIAGNOSIS — I878 Other specified disorders of veins: Secondary | ICD-10-CM | POA: Diagnosis not present

## 2020-05-23 ENCOUNTER — Telehealth: Payer: Self-pay

## 2020-05-23 NOTE — Progress Notes (Signed)
Attempted to call pt. Lft msg to return call.

## 2020-05-23 NOTE — Progress Notes (Signed)
Pt notified of xray results per Dr. Diona Fanti.

## 2020-05-23 NOTE — Telephone Encounter (Signed)
Pt made aware

## 2020-05-23 NOTE — Telephone Encounter (Signed)
-----   Message from [name], MD sent at 05/21/2020 12:05 PM EST ----- Notify pt--XR shows 1-2 small stones in lt kidney--no need to treat now--keep scheduled followup ----- Message ----- From: Dorisann Frames, RN Sent: 05/19/2020  10:31 AM EST To: [name], MD  Please review

## 2020-05-23 NOTE — Telephone Encounter (Signed)
Pt returned prior note up front. I tried to return her call Left message.

## 2020-06-01 DIAGNOSIS — M9903 Segmental and somatic dysfunction of lumbar region: Secondary | ICD-10-CM | POA: Diagnosis not present

## 2020-06-01 DIAGNOSIS — S338XXA Sprain of other parts of lumbar spine and pelvis, initial encounter: Secondary | ICD-10-CM | POA: Diagnosis not present

## 2020-06-03 DIAGNOSIS — E7849 Other hyperlipidemia: Secondary | ICD-10-CM | POA: Diagnosis not present

## 2020-06-03 DIAGNOSIS — E1165 Type 2 diabetes mellitus with hyperglycemia: Secondary | ICD-10-CM | POA: Diagnosis not present

## 2020-06-03 DIAGNOSIS — I10 Essential (primary) hypertension: Secondary | ICD-10-CM | POA: Diagnosis not present

## 2020-06-11 IMAGING — US US THYROID
1 series · 13 of 25 positions shown · non-contrast
Comparison: Parathyroid scintigraphy from the same day

CLINICAL DATA: Hyperparathyroidism

EXAM:
THYROID ULTRASOUND
TECHNIQUE: Ultrasound examination of the thyroid gland and adjacent soft
tissues was performed.

[Series 1: us thyroid · 0.06mm/px · 13 of 96 slices shown]
[im 1/96]
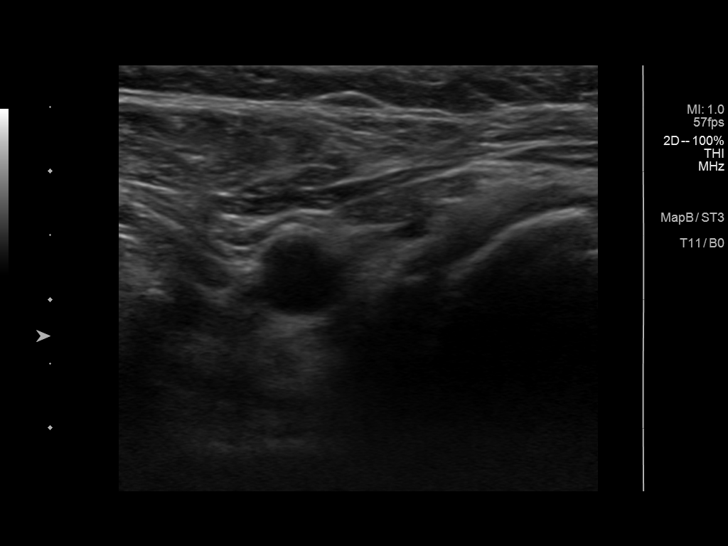
[im 8/96]
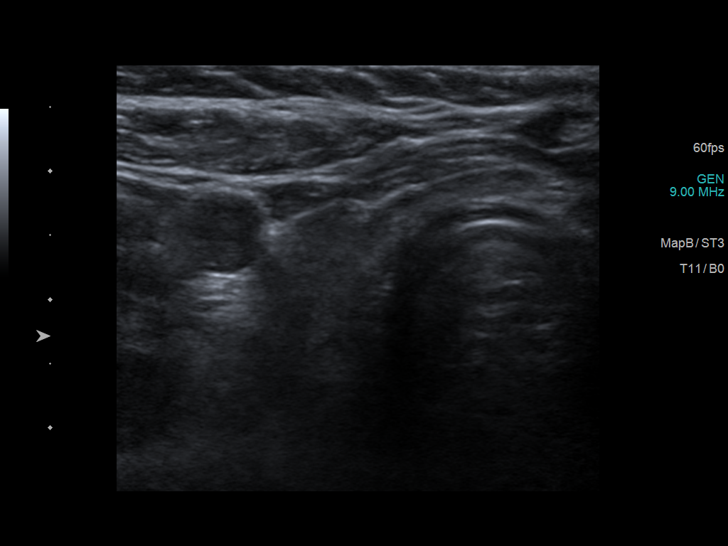
[im 16/96]
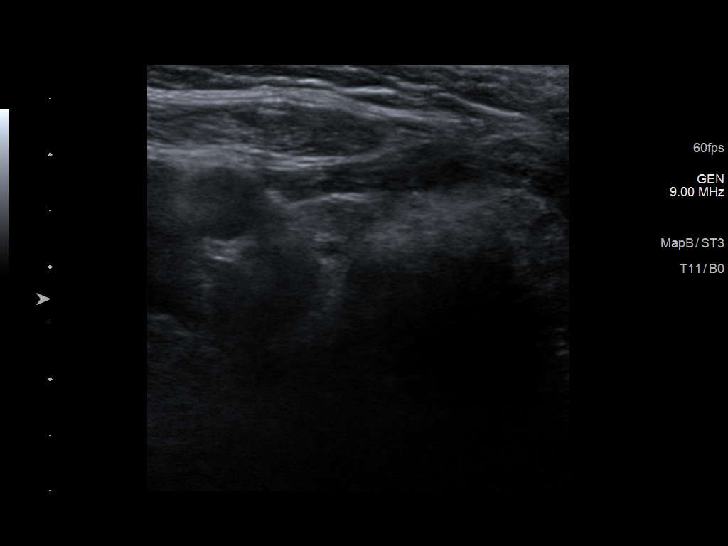
[im 24/96]
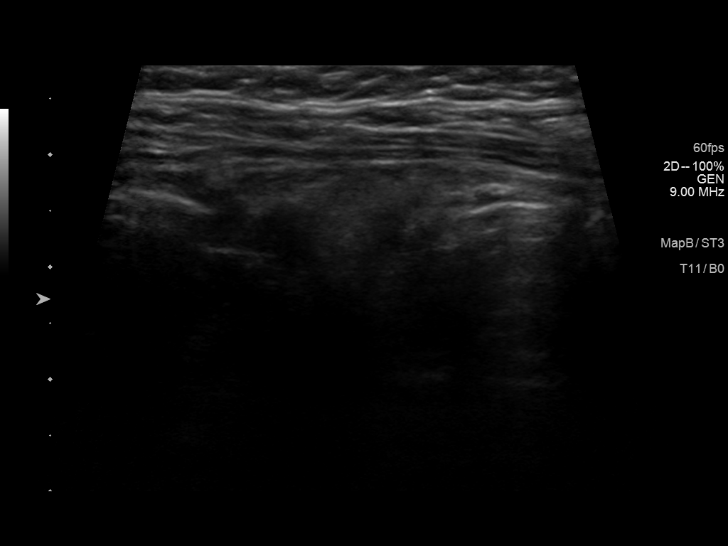
[im 32/96]
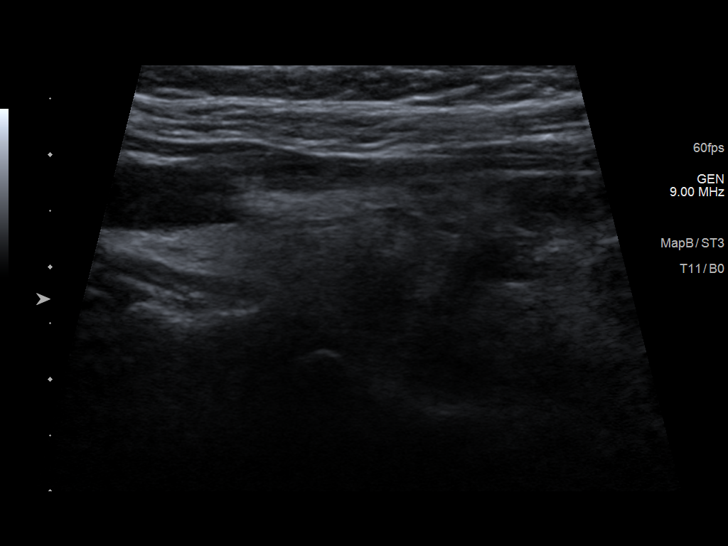
[im 40/96]
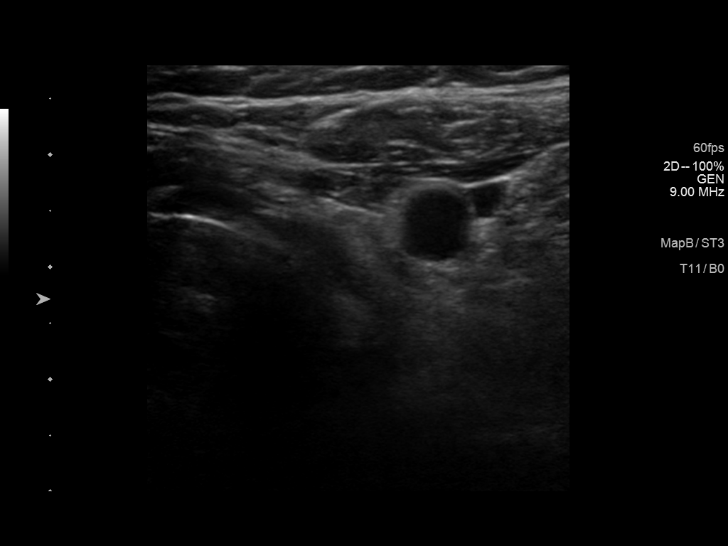
[im 48/96]
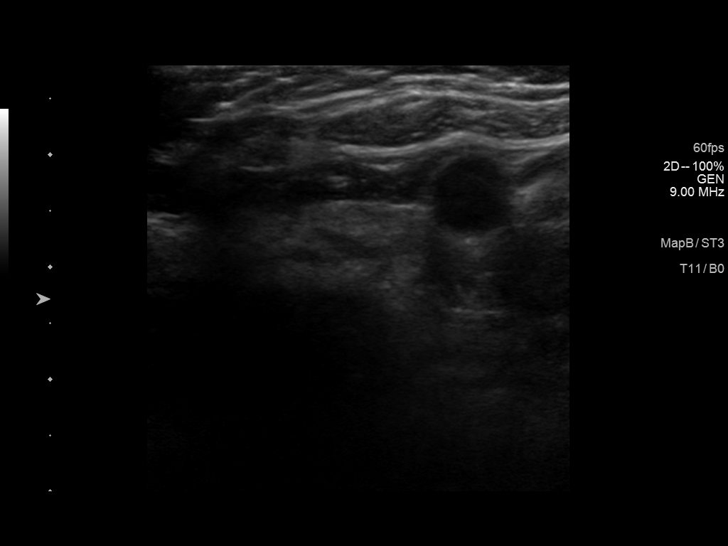
[im 56/96]
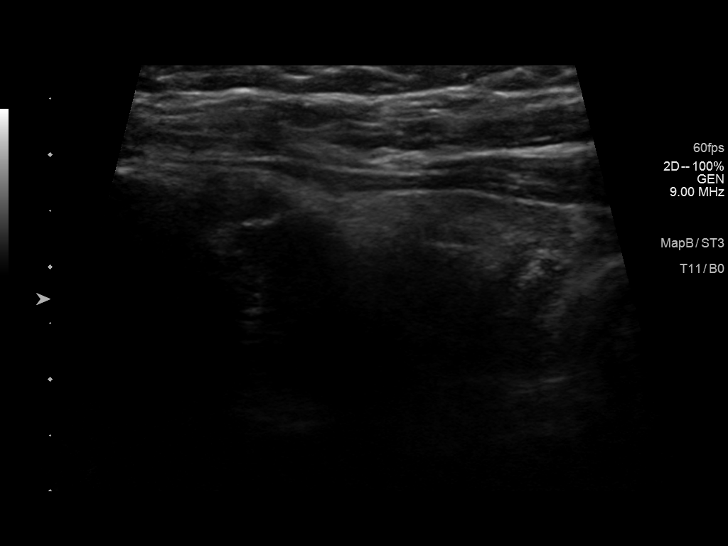
[im 64/96]
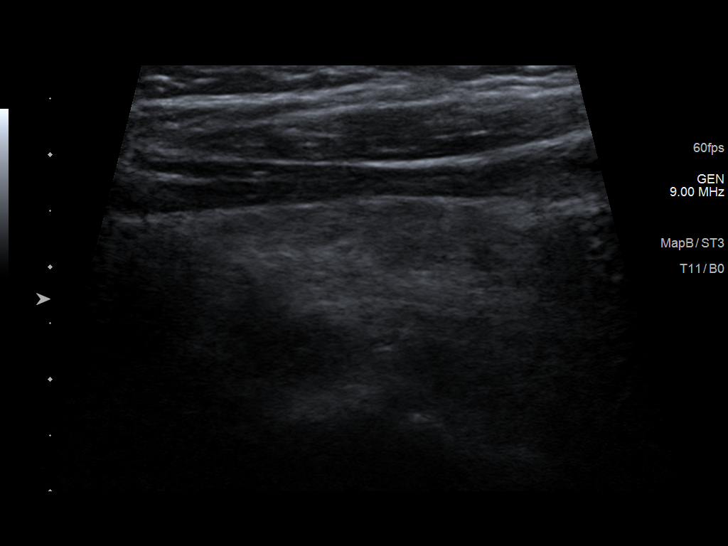
[im 72/96]
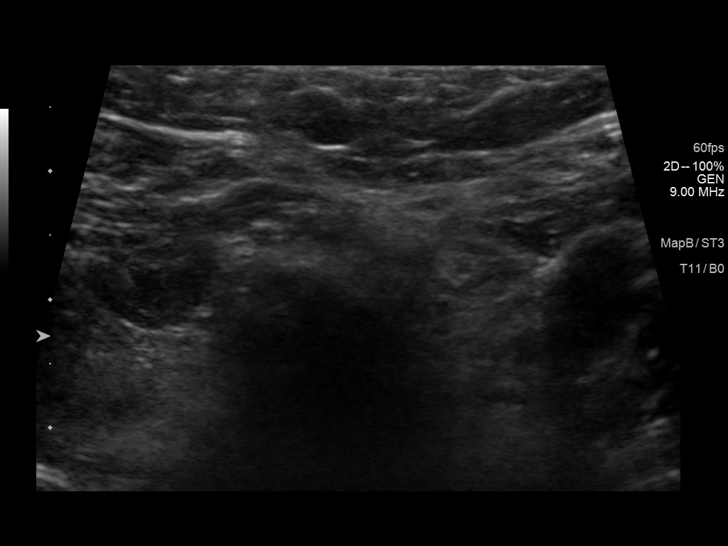
[im 80/96]
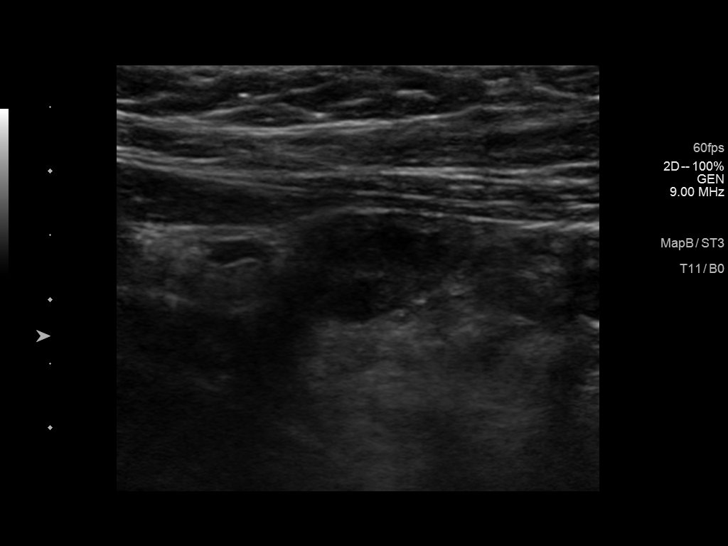
[im 88/96]
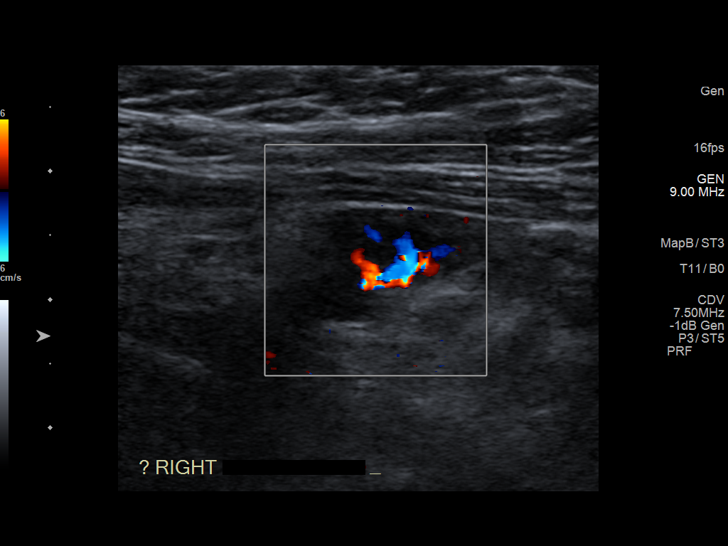
[im 96/96]
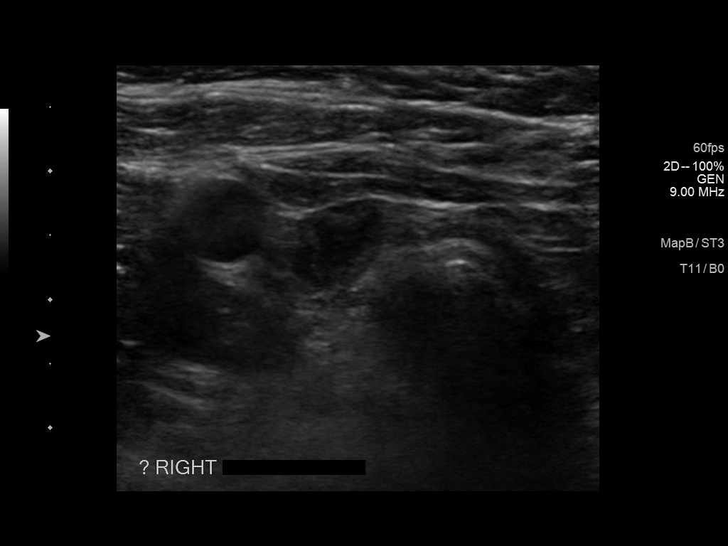

[13 of 25 positions shown; findings below may reference images not displayed]

FINDINGS: Parenchymal Echotexture: Moderately heterogenous

Isthmus: 0.2 cm thickness

Right lobe:3.7 x 1.7 x 1.2 cm

Left lobe: 3.5 x 1.1 x 0.8 cm

_________________________________________________________

Estimated total number of nodules >/= 1 cm: 1

Number of spongiform nodules >/=  2 cm not described below (TR1): 0

Number of mixed cystic and solid nodules >/= 1.5 cm not described
below (TR2): 0

_________________________________________________________

Nodule # 1:

Location: Right; inferior thyroid

Maximum size: 1.7 cm; Other 2 dimensions: 0.7 x 1.1 cm

Composition: solid/almost completely solid (2)

Echogenicity: hypoechoic (2)

Shape: not taller-than-wide (0)

Margins: smooth (0)

Echogenic foci: none (0)

ACR TI-RADS total points: 4.

ACR TI-RADS risk category: TR4 (4-6 points).

ACR TI-RADS recommendations:

**Given size (>/= 1.5 cm) and appearance, fine needle aspiration of
this moderately suspicious nodule should be considered based on
TI-RADS criteria.

_________________________________________________________
IMPRESSION: 1. 1.7 cm parathyroid adenoma inferior to the right lobe of the
thyroid, corresponding to findings on scintigraphy.
2. Normal-sized heterogenous thyroid without additional focal
lesion.

The above is in keeping with the ACR TI-RADS recommendations - [HOSPITAL] 5183;[DATE].

## 2020-06-15 DIAGNOSIS — M9903 Segmental and somatic dysfunction of lumbar region: Secondary | ICD-10-CM | POA: Diagnosis not present

## 2020-06-15 DIAGNOSIS — S338XXA Sprain of other parts of lumbar spine and pelvis, initial encounter: Secondary | ICD-10-CM | POA: Diagnosis not present

## 2020-06-29 DIAGNOSIS — M9903 Segmental and somatic dysfunction of lumbar region: Secondary | ICD-10-CM | POA: Diagnosis not present

## 2020-06-29 DIAGNOSIS — S338XXA Sprain of other parts of lumbar spine and pelvis, initial encounter: Secondary | ICD-10-CM | POA: Diagnosis not present

## 2020-07-04 DIAGNOSIS — L110 Acquired keratosis follicularis: Secondary | ICD-10-CM | POA: Diagnosis not present

## 2020-07-04 DIAGNOSIS — M9903 Segmental and somatic dysfunction of lumbar region: Secondary | ICD-10-CM | POA: Diagnosis not present

## 2020-07-04 DIAGNOSIS — E782 Mixed hyperlipidemia: Secondary | ICD-10-CM | POA: Diagnosis not present

## 2020-07-04 DIAGNOSIS — S338XXA Sprain of other parts of lumbar spine and pelvis, initial encounter: Secondary | ICD-10-CM | POA: Diagnosis not present

## 2020-07-04 DIAGNOSIS — L609 Nail disorder, unspecified: Secondary | ICD-10-CM | POA: Diagnosis not present

## 2020-07-04 DIAGNOSIS — I739 Peripheral vascular disease, unspecified: Secondary | ICD-10-CM | POA: Diagnosis not present

## 2020-07-04 DIAGNOSIS — E7849 Other hyperlipidemia: Secondary | ICD-10-CM | POA: Diagnosis not present

## 2020-07-04 DIAGNOSIS — E213 Hyperparathyroidism, unspecified: Secondary | ICD-10-CM | POA: Diagnosis not present

## 2020-07-04 DIAGNOSIS — E1140 Type 2 diabetes mellitus with diabetic neuropathy, unspecified: Secondary | ICD-10-CM | POA: Diagnosis not present

## 2020-07-04 DIAGNOSIS — R5383 Other fatigue: Secondary | ICD-10-CM | POA: Diagnosis not present

## 2020-07-04 DIAGNOSIS — I10 Essential (primary) hypertension: Secondary | ICD-10-CM | POA: Diagnosis not present

## 2020-07-06 DIAGNOSIS — Z0001 Encounter for general adult medical examination with abnormal findings: Secondary | ICD-10-CM | POA: Diagnosis not present

## 2020-07-06 DIAGNOSIS — E1140 Type 2 diabetes mellitus with diabetic neuropathy, unspecified: Secondary | ICD-10-CM | POA: Diagnosis not present

## 2020-07-06 DIAGNOSIS — G619 Inflammatory polyneuropathy, unspecified: Secondary | ICD-10-CM | POA: Diagnosis not present

## 2020-07-06 DIAGNOSIS — I10 Essential (primary) hypertension: Secondary | ICD-10-CM | POA: Diagnosis not present

## 2020-07-06 DIAGNOSIS — N1831 Chronic kidney disease, stage 3a: Secondary | ICD-10-CM | POA: Diagnosis not present

## 2020-07-06 DIAGNOSIS — E1122 Type 2 diabetes mellitus with diabetic chronic kidney disease: Secondary | ICD-10-CM | POA: Diagnosis not present

## 2020-07-06 DIAGNOSIS — E7849 Other hyperlipidemia: Secondary | ICD-10-CM | POA: Diagnosis not present

## 2020-07-07 DIAGNOSIS — M9903 Segmental and somatic dysfunction of lumbar region: Secondary | ICD-10-CM | POA: Diagnosis not present

## 2020-07-07 DIAGNOSIS — S338XXA Sprain of other parts of lumbar spine and pelvis, initial encounter: Secondary | ICD-10-CM | POA: Diagnosis not present

## 2020-07-15 DIAGNOSIS — M9903 Segmental and somatic dysfunction of lumbar region: Secondary | ICD-10-CM | POA: Diagnosis not present

## 2020-07-15 DIAGNOSIS — S338XXA Sprain of other parts of lumbar spine and pelvis, initial encounter: Secondary | ICD-10-CM | POA: Diagnosis not present

## 2020-07-28 ENCOUNTER — Other Ambulatory Visit: Payer: Self-pay | Admitting: Urology

## 2020-07-28 DIAGNOSIS — N202 Calculus of kidney with calculus of ureter: Secondary | ICD-10-CM

## 2020-08-31 DIAGNOSIS — E213 Hyperparathyroidism, unspecified: Secondary | ICD-10-CM | POA: Diagnosis not present

## 2020-08-31 DIAGNOSIS — E1165 Type 2 diabetes mellitus with hyperglycemia: Secondary | ICD-10-CM | POA: Diagnosis not present

## 2020-08-31 DIAGNOSIS — E7849 Other hyperlipidemia: Secondary | ICD-10-CM | POA: Diagnosis not present

## 2020-08-31 DIAGNOSIS — I10 Essential (primary) hypertension: Secondary | ICD-10-CM | POA: Diagnosis not present

## 2020-08-31 DIAGNOSIS — Z7984 Long term (current) use of oral hypoglycemic drugs: Secondary | ICD-10-CM | POA: Diagnosis not present

## 2020-09-26 DIAGNOSIS — I739 Peripheral vascular disease, unspecified: Secondary | ICD-10-CM | POA: Diagnosis not present

## 2020-09-26 DIAGNOSIS — L609 Nail disorder, unspecified: Secondary | ICD-10-CM | POA: Diagnosis not present

## 2020-09-26 DIAGNOSIS — L110 Acquired keratosis follicularis: Secondary | ICD-10-CM | POA: Diagnosis not present

## 2020-09-30 DIAGNOSIS — N1831 Chronic kidney disease, stage 3a: Secondary | ICD-10-CM | POA: Diagnosis not present

## 2020-09-30 DIAGNOSIS — E1165 Type 2 diabetes mellitus with hyperglycemia: Secondary | ICD-10-CM | POA: Diagnosis not present

## 2020-10-04 DIAGNOSIS — E1122 Type 2 diabetes mellitus with diabetic chronic kidney disease: Secondary | ICD-10-CM | POA: Diagnosis not present

## 2020-10-04 DIAGNOSIS — E892 Postprocedural hypoparathyroidism: Secondary | ICD-10-CM | POA: Diagnosis not present

## 2020-10-04 DIAGNOSIS — E1140 Type 2 diabetes mellitus with diabetic neuropathy, unspecified: Secondary | ICD-10-CM | POA: Diagnosis not present

## 2020-10-04 DIAGNOSIS — I10 Essential (primary) hypertension: Secondary | ICD-10-CM | POA: Diagnosis not present

## 2020-10-04 DIAGNOSIS — G619 Inflammatory polyneuropathy, unspecified: Secondary | ICD-10-CM | POA: Diagnosis not present

## 2020-10-04 DIAGNOSIS — E7849 Other hyperlipidemia: Secondary | ICD-10-CM | POA: Diagnosis not present

## 2020-10-21 DIAGNOSIS — R0600 Dyspnea, unspecified: Secondary | ICD-10-CM | POA: Diagnosis not present

## 2020-10-21 DIAGNOSIS — I517 Cardiomegaly: Secondary | ICD-10-CM | POA: Diagnosis not present

## 2020-10-21 DIAGNOSIS — I5189 Other ill-defined heart diseases: Secondary | ICD-10-CM | POA: Diagnosis not present

## 2020-10-27 DIAGNOSIS — M9903 Segmental and somatic dysfunction of lumbar region: Secondary | ICD-10-CM | POA: Diagnosis not present

## 2020-10-27 DIAGNOSIS — S338XXA Sprain of other parts of lumbar spine and pelvis, initial encounter: Secondary | ICD-10-CM | POA: Diagnosis not present

## 2020-10-31 DIAGNOSIS — I10 Essential (primary) hypertension: Secondary | ICD-10-CM | POA: Diagnosis not present

## 2020-10-31 DIAGNOSIS — E213 Hyperparathyroidism, unspecified: Secondary | ICD-10-CM | POA: Diagnosis not present

## 2020-10-31 DIAGNOSIS — Z7984 Long term (current) use of oral hypoglycemic drugs: Secondary | ICD-10-CM | POA: Diagnosis not present

## 2020-10-31 DIAGNOSIS — E7849 Other hyperlipidemia: Secondary | ICD-10-CM | POA: Diagnosis not present

## 2020-10-31 DIAGNOSIS — E1165 Type 2 diabetes mellitus with hyperglycemia: Secondary | ICD-10-CM | POA: Diagnosis not present

## 2020-11-01 DIAGNOSIS — S338XXA Sprain of other parts of lumbar spine and pelvis, initial encounter: Secondary | ICD-10-CM | POA: Diagnosis not present

## 2020-11-01 DIAGNOSIS — M9903 Segmental and somatic dysfunction of lumbar region: Secondary | ICD-10-CM | POA: Diagnosis not present

## 2020-11-08 ENCOUNTER — Other Ambulatory Visit: Payer: Self-pay

## 2020-11-08 ENCOUNTER — Ambulatory Visit (HOSPITAL_COMMUNITY)
Admission: RE | Admit: 2020-11-08 | Discharge: 2020-11-08 | Disposition: A | Discharge status: Home / Self Care | Payer: Medicare Other | Source: Ambulatory Visit | Attending: Urology | Admitting: Urology

## 2020-11-08 ENCOUNTER — Ambulatory Visit (INDEPENDENT_AMBULATORY_CARE_PROVIDER_SITE_OTHER): Payer: Medicare Other | Admitting: Urology

## 2020-11-08 ENCOUNTER — Encounter: Payer: Self-pay | Admitting: Urology

## 2020-11-08 VITALS — BP 134/70 | HR 77 | Temp 98.5°F | Ht 65.0 in

## 2020-11-08 DIAGNOSIS — N3000 Acute cystitis without hematuria: Secondary | ICD-10-CM | POA: Diagnosis not present

## 2020-11-08 DIAGNOSIS — I878 Other specified disorders of veins: Secondary | ICD-10-CM | POA: Diagnosis not present

## 2020-11-08 DIAGNOSIS — N202 Calculus of kidney with calculus of ureter: Secondary | ICD-10-CM | POA: Diagnosis not present

## 2020-11-08 DIAGNOSIS — N2889 Other specified disorders of kidney and ureter: Secondary | ICD-10-CM | POA: Diagnosis not present

## 2020-11-08 DIAGNOSIS — S338XXA Sprain of other parts of lumbar spine and pelvis, initial encounter: Secondary | ICD-10-CM | POA: Diagnosis not present

## 2020-11-08 DIAGNOSIS — M9903 Segmental and somatic dysfunction of lumbar region: Secondary | ICD-10-CM | POA: Diagnosis not present

## 2020-11-08 DIAGNOSIS — N200 Calculus of kidney: Secondary | ICD-10-CM | POA: Diagnosis not present

## 2020-11-08 LAB — MICROSCOPIC EXAMINATION
Epithelial Cells (non renal): >10 /hpf — AB (ref 0–10)
RBC, Urine: >30 /hpf — AB (ref 0–2)
Renal Epithel, UA: NONE SEEN /hpf

## 2020-11-08 LAB — URINALYSIS, ROUTINE W REFLEX MICROSCOPIC
Bilirubin, UA: NEGATIVE
Glucose, UA: NEGATIVE
Nitrite, UA: NEGATIVE
Specific Gravity, UA: >1.03 — ABNORMAL HIGH (ref 1.005–1.030)
Urobilinogen, Ur: 0.2 mg/dL (ref 0.2–1.0)
pH, UA: 5.5 (ref 5.0–7.5)

## 2020-11-08 NOTE — Progress Notes (Signed)
Urological Symptom Review  Patient is experiencing the following symptoms: Get up at night to urinate Kidney Stones   Review of Systems  Gastrointestinal (upper)  : Negative for upper GI symptoms  Gastrointestinal (lower) : Negative for lower GI symptoms  Constitutional : Negative for symptoms  Skin: Negative for skin symptoms  Eyes: Negative for eye symptoms  Ear/Nose/Throat : Negative for Ear/Nose/Throat symptoms  Hematologic/Lymphatic: Negative for Hematologic/Lymphatic symptoms  Cardiovascular : Negative for cardiovascular symptoms  Respiratory : Negative for respiratory symptoms  Endocrine: Negative for endocrine symptoms  Musculoskeletal: Joint pain  Neurological: Negative for neurological symptoms  Psychologic: Negative for psychiatric symptoms

## 2020-11-08 NOTE — Progress Notes (Signed)
History of Present Illness: Allison Oneill is a 70 y.o. year old female presents for follow-up of urolithiasis.  3.5.2020: Underwent left PCNL--tubeless w/ J2 stent placed, discharged on POD 1.   5.19.2020: A recent renal ultrasound revealed normal-appearing bladder, no hydronephrosis or mass in either kidney. 2-3 small calculi left lower pole, 2 right renal calculi, all 6 mm in diameter or smaller. She has had no gross hematuria or flank pain. The a the she states that she did the 24 hour urine collection that I ordered, but I have not received the results. She did have a serum calcium of 10.8 in January of this year. She did say that she had a parathyroid hormone test done.   04/28/2019: She passed a small stone 2 weeks ago. She had a parthyroidectomy with Dr. Harlow Asa recently. No flank pain currently.  08.31.2021:  24-hour urine performed in June 2021 revealed low urinary citrate and exceedingly low volume.  It was recommended that she drink more water, add lemon or lime juice to this.  12.16.2021: KUB--Left mid to inferior pole calcific densities. No calcific densities overlying the right renal shadow or expected course of the ureters. Pelvic phleboliths. Nonobstructive bowel gas pattern. Multilevel Spondylosis.  6.7.2022: She denies recent gross hematuria, dysuria, flank pain.  She has been trying to drink more fluids.   Past Medical History:  Diagnosis Date  . Anxiety   . Diabetes mellitus without complication ([place])    TYPE 2  . Fibromyalgia   . Hypertension   . Left renal stone   . Neuromuscular disorder (HCC)    restless leg syndrome  . Neuropathy    FEET    Past Surgical History:  Procedure Laterality Date  . ABDOMINAL HYSTERECTOMY  2000   COMPLETE  . CYSTOSCOPY N/A 08/27/2013   Procedure: CYSTOSCOPY; REMOVAL OF BLADDER CALCULUS;  Surgeon: [name], MD;  Location: [facility];  Service: Urology;  Laterality: N/A;  . CYSTOSCOPY W/ URETERAL STENT PLACEMENT N/A 2012   . CYSTOSCOPY W/ URETERAL STENT REMOVAL N/A 2013  . IR URETERAL STENT LEFT NEW ACCESS W/O SEP NEPHROSTOMY CATH  08/07/2018  . LITHOTRIPSY  2012  . NEPHROLITHOTOMY Left 08/07/2018   Procedure: NEPHROLITHOTOMY PERCUTANEOUS;  Surgeon: [name], MD;  Location: [facility];  Service: Urology;  Laterality: Left;  . NEUROMA SURGERY Left 1997  . PARATHYROIDECTOMY Right 02/27/2019   Procedure: RIGHT INFERIOR PARATHYROIDECTOMY;  Surgeon: [name], MD;  Location: [facility];  Service: General;  Laterality: Right;    Home Medications:  (Not in a hospital admission)   Allergies:  Allergies  Allergen Reactions  . Sulfa Antibiotics Itching and Rash    No family history on file.  Social History:  reports that she has never smoked. She has never used smokeless tobacco. She reports that she does not drink alcohol and does not use drugs.  ROS: A complete review of systems was performed.  All systems are negative except for pertinent findings as noted.  Physical Exam:  Vital signs in last 24 hours: @VSRANGES @ General:  Alert and oriented, No acute distress HEENT: Normocephalic, atraumatic Neck: No JVD or lymphadenopathy Cardiovascular: Regular rate  Lungs: Normal inspiratory/expiratory excursion Extremities: No edema Neurologic: Grossly intact  I have reviewed prior pt notes  I have reviewed urinalysis results  I have independently reviewed prior imaging--KUB film from 05/18/2020 reviewed with patient.  Most recent hemoglobin A1c over 9  Impression/Assessment:  History of urolithiasis with last KUB from 2021 revealing perhaps 2 small left  renal calculi, patient currently asymptomatic  Plan:  1.  I went over stone prevention diet with patient  2.  KUB will be performed today  3.  Unless significant change, I will see her back in 6 months with KUB  Allison Oneill Allison Oneill 11/08/2020, 12:39 PM  Allison Oneill. Allison Gavina MD

## 2020-11-11 ENCOUNTER — Telehealth: Payer: Self-pay

## 2020-11-11 LAB — URINE CULTURE

## 2020-11-11 NOTE — Telephone Encounter (Signed)
-----   Message from [name], MD sent at 11/11/2020  9:50 AM EDT ----- Let pt know urine culture showed only external skin bacteria--no abx needed ----- Message ----- From: Iris Pert, LPN Sent: 01/20/4036   8:30 AM EDT To: [name], MD  Please review

## 2020-11-15 ENCOUNTER — Ambulatory Visit: Payer: Medicare Other | Admitting: Urology

## 2020-11-22 DIAGNOSIS — M9903 Segmental and somatic dysfunction of lumbar region: Secondary | ICD-10-CM | POA: Diagnosis not present

## 2020-11-22 DIAGNOSIS — S338XXA Sprain of other parts of lumbar spine and pelvis, initial encounter: Secondary | ICD-10-CM | POA: Diagnosis not present

## 2020-11-29 ENCOUNTER — Other Ambulatory Visit: Payer: Self-pay | Admitting: Urology

## 2020-11-29 DIAGNOSIS — N202 Calculus of kidney with calculus of ureter: Secondary | ICD-10-CM

## 2020-11-30 ENCOUNTER — Telehealth: Payer: Self-pay

## 2020-11-30 NOTE — Telephone Encounter (Signed)
-----   Message from [name], MD sent at 11/29/2020  3:53 PM EDT ----- Please call patient-I reviewed her KUB.  It looks like the stones may be growing.  I want to get a CT scan to get a better look.  I put the order in. ----- Message ----- From: Iris Pert, LPN Sent: 08/08/1060   4:07 PM EDT To: [name], MD  Please review

## 2020-11-30 NOTE — Telephone Encounter (Signed)
Patient called with no answer. Detailed message left on voicemail.

## 2020-12-01 DIAGNOSIS — E7849 Other hyperlipidemia: Secondary | ICD-10-CM | POA: Diagnosis not present

## 2020-12-01 DIAGNOSIS — Z7984 Long term (current) use of oral hypoglycemic drugs: Secondary | ICD-10-CM | POA: Diagnosis not present

## 2020-12-01 DIAGNOSIS — E213 Hyperparathyroidism, unspecified: Secondary | ICD-10-CM | POA: Diagnosis not present

## 2020-12-01 DIAGNOSIS — E1165 Type 2 diabetes mellitus with hyperglycemia: Secondary | ICD-10-CM | POA: Diagnosis not present

## 2020-12-01 DIAGNOSIS — I10 Essential (primary) hypertension: Secondary | ICD-10-CM | POA: Diagnosis not present

## 2020-12-13 DIAGNOSIS — S338XXA Sprain of other parts of lumbar spine and pelvis, initial encounter: Secondary | ICD-10-CM | POA: Diagnosis not present

## 2020-12-13 DIAGNOSIS — M9903 Segmental and somatic dysfunction of lumbar region: Secondary | ICD-10-CM | POA: Diagnosis not present

## 2020-12-22 ENCOUNTER — Ambulatory Visit (HOSPITAL_COMMUNITY)
Admission: RE | Admit: 2020-12-22 | Discharge: 2020-12-22 | Disposition: A | Discharge status: Home / Self Care | Payer: Medicare Other | Source: Ambulatory Visit | Attending: Urology | Admitting: Urology

## 2020-12-22 ENCOUNTER — Other Ambulatory Visit: Payer: Self-pay | Admitting: Urology

## 2020-12-22 ENCOUNTER — Other Ambulatory Visit: Payer: Self-pay

## 2020-12-22 DIAGNOSIS — N202 Calculus of kidney with calculus of ureter: Secondary | ICD-10-CM | POA: Diagnosis not present

## 2020-12-22 DIAGNOSIS — N200 Calculus of kidney: Secondary | ICD-10-CM | POA: Diagnosis not present

## 2020-12-27 DIAGNOSIS — M9903 Segmental and somatic dysfunction of lumbar region: Secondary | ICD-10-CM | POA: Diagnosis not present

## 2020-12-27 DIAGNOSIS — S338XXA Sprain of other parts of lumbar spine and pelvis, initial encounter: Secondary | ICD-10-CM | POA: Diagnosis not present

## 2020-12-29 DIAGNOSIS — L110 Acquired keratosis follicularis: Secondary | ICD-10-CM | POA: Diagnosis not present

## 2020-12-29 DIAGNOSIS — L609 Nail disorder, unspecified: Secondary | ICD-10-CM | POA: Diagnosis not present

## 2020-12-29 DIAGNOSIS — I739 Peripheral vascular disease, unspecified: Secondary | ICD-10-CM | POA: Diagnosis not present

## 2021-01-01 DIAGNOSIS — E7849 Other hyperlipidemia: Secondary | ICD-10-CM | POA: Diagnosis not present

## 2021-01-01 DIAGNOSIS — E213 Hyperparathyroidism, unspecified: Secondary | ICD-10-CM | POA: Diagnosis not present

## 2021-01-01 DIAGNOSIS — I10 Essential (primary) hypertension: Secondary | ICD-10-CM | POA: Diagnosis not present

## 2021-01-01 DIAGNOSIS — Z7984 Long term (current) use of oral hypoglycemic drugs: Secondary | ICD-10-CM | POA: Diagnosis not present

## 2021-01-01 DIAGNOSIS — E1165 Type 2 diabetes mellitus with hyperglycemia: Secondary | ICD-10-CM | POA: Diagnosis not present

## 2021-01-02 DIAGNOSIS — U071 COVID-19: Secondary | ICD-10-CM | POA: Diagnosis not present

## 2021-01-02 DIAGNOSIS — J0100 Acute maxillary sinusitis, unspecified: Secondary | ICD-10-CM | POA: Diagnosis not present

## 2021-01-02 DIAGNOSIS — Z20822 Contact with and (suspected) exposure to covid-19: Secondary | ICD-10-CM | POA: Diagnosis not present

## 2021-01-03 ENCOUNTER — Telehealth: Payer: Self-pay

## 2021-01-03 NOTE — Telephone Encounter (Signed)
Appointment scheduled. Patient called and made aware.

## 2021-01-03 NOTE — Telephone Encounter (Signed)
-----   Message from [name], MD sent at 01/03/2021  8:56 AM EDT ----- Please notify the patient that she is is have a couple of fairly large stones in her left kidney.  Schedule her to see me next available to talk about treatment options. ----- Message ----- From: Iris Pert, LPN Sent: [id number]   8:46 AM EDT To: [name], MD  Please review

## 2021-01-24 ENCOUNTER — Other Ambulatory Visit: Payer: Self-pay

## 2021-01-24 ENCOUNTER — Ambulatory Visit (INDEPENDENT_AMBULATORY_CARE_PROVIDER_SITE_OTHER): Payer: Medicare Other | Admitting: Urology

## 2021-01-24 VITALS — BP 122/62 | HR 92

## 2021-01-24 DIAGNOSIS — N200 Calculus of kidney: Secondary | ICD-10-CM

## 2021-01-24 LAB — URINALYSIS, ROUTINE W REFLEX MICROSCOPIC
Bilirubin, UA: NEGATIVE
Glucose, UA: NEGATIVE
Ketones, UA: NEGATIVE
Nitrite, UA: NEGATIVE
Specific Gravity, UA: >1.03 — ABNORMAL HIGH (ref 1.005–1.030)
Urobilinogen, Ur: 0.2 mg/dL (ref 0.2–1.0)
pH, UA: 5.5 (ref 5.0–7.5)

## 2021-01-24 NOTE — Progress Notes (Signed)
History of Present Illness:  3.5.2020: Underwent left PCNL--tubeless w/ J2 stent placed, discharged on POD 1.    5.19.2020: A recent renal ultrasound revealed normal-appearing bladder, no hydronephrosis or mass in either kidney. 2-3 small calculi left lower pole, 2 right renal calculi, all 6 mm in diameter or smaller. She did have a serum calcium of 10.8 in January of this year. She did say that she had a parathyroid hormone test done.    04/28/2019: She had a parthyroidectomy with Dr. Harlow Asa recently. No flank pain currently.   08.31.2021:  24-hour urine performed in June 2021 revealed low urinary citrate and exceedingly low volume.  It was recommended that she drink more water, add lemon or lime juice to this.   12.16.2021: KUB--Left mid to inferior pole calcific densities. No calcific densities overlying the right renal shadow or expected course of the ureters. Pelvic phleboliths. Nonobstructive bowel gas pattern. Multilevel Spondylosis.   6.7.2022: She denies recent gross hematuria, dysuria, flank pain.  She has been trying to drink more fluids. KUB--At least 2 faintly calcified stones overlying the lower left kidney, larger measures 14 mm and the smaller measures 7 mm.  7.22.2022: CT--1. Bulky branching calculus within the LEFT renal pelvis and extending into the lower pole calices. 2. No ureterolithiasis or obstructive uropathy.  8.23.2022: She comes in today for follow-up visit.  She has no left-sided flank pain or gross hematuria.  She is here to discuss treatment of this left-sided stone burden.  Past Medical History:  Diagnosis Date   Anxiety    Diabetes mellitus without complication ([place])    TYPE 2   Fibromyalgia    Hypertension    Left renal stone    Neuromuscular disorder ([place])    restless leg syndrome   Neuropathy    FEET    Past Surgical History:  Procedure Laterality Date   ABDOMINAL HYSTERECTOMY  2000   COMPLETE   CYSTOSCOPY N/A 08/27/2013   Procedure:  CYSTOSCOPY; REMOVAL OF BLADDER CALCULUS;  Surgeon: [name], MD;  Location: [facility];  Service: Urology;  Laterality: N/A;   CYSTOSCOPY W/ URETERAL STENT PLACEMENT N/A 2012   CYSTOSCOPY W/ URETERAL STENT REMOVAL N/A 2013   IR URETERAL STENT LEFT NEW ACCESS W/O SEP NEPHROSTOMY CATH  08/07/2018   LITHOTRIPSY  2012   NEPHROLITHOTOMY Left 08/07/2018   Procedure: NEPHROLITHOTOMY PERCUTANEOUS;  Surgeon: [name], MD;  Location: [facility];  Service: Urology;  Laterality: Left;   NEUROMA SURGERY Left 1997   PARATHYROIDECTOMY Right 02/27/2019   Procedure: RIGHT INFERIOR PARATHYROIDECTOMY;  Surgeon: [name], MD;  Location: [facility];  Service: General;  Laterality: Right;    Home Medications:  (Not in a hospital admission)   Allergies:  Allergies  Allergen Reactions   Sulfa Antibiotics Itching and Rash    No family history on file.  Social History:  reports that she has never smoked. She has never used smokeless tobacco. She reports that she does not drink alcohol and does not use drugs.  ROS: A complete review of systems was performed.  All systems are negative except for pertinent findings as noted.  Physical Exam:  Vital signs in last 24 hours: '@VSRANGES'$ @ General:  Alert and oriented, No acute distress HEENT: Normocephalic, atraumatic Neck: No JVD or lymphadenopathy Cardiovascular: Regular rate  Lungs: Normal inspiratory/expiratory excursion  Extremities: No edema Neurologic: Grossly intact  I have reviewed prior pt notes  I have reviewed notes from referring/previous physicians  I have reviewed urinalysis results  I have independently reviewed prior imaging--I reviewed CT images with her.  There is an 18 mm branched left renal stone.   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  I have strongly recommended percutaneous management, as this is a branched stone and she also has  other calyceal stones.  She does not tolerate stents, so I will proceed with out placing a stent, letting her know that she may need her percutaneous tube a little bit longer postop  Lillette Boxer Nikkolas Coomes 01/24/2021, 8:19 AM  Lillette Boxer. Seara Hinesley MD

## 2021-01-24 NOTE — Progress Notes (Signed)
Urological Symptom Review  Patient is experiencing the following symptoms: Get up at night to urinate   Review of Systems  Gastrointestinal (upper)  : Negative for upper GI symptoms  Gastrointestinal (lower) : Negative for lower GI symptoms  Constitutional : Negative for symptoms  Skin: Negative for skin symptoms  Eyes: Negative for eye symptoms  Ear/Nose/Throat : Sinus problems  Hematologic/Lymphatic: Negative for Hematologic/Lymphatic symptoms  Cardiovascular : Negative for cardiovascular symptoms  Respiratory : Cough  Endocrine: Negative for endocrine symptoms  Musculoskeletal: Joint pain  Neurological: Negative for neurological symptoms  Psychologic: Negative for psychiatric symptoms

## 2021-01-25 DIAGNOSIS — E7849 Other hyperlipidemia: Secondary | ICD-10-CM | POA: Diagnosis not present

## 2021-01-25 DIAGNOSIS — R5383 Other fatigue: Secondary | ICD-10-CM | POA: Diagnosis not present

## 2021-01-25 DIAGNOSIS — I10 Essential (primary) hypertension: Secondary | ICD-10-CM | POA: Diagnosis not present

## 2021-01-25 DIAGNOSIS — E1140 Type 2 diabetes mellitus with diabetic neuropathy, unspecified: Secondary | ICD-10-CM | POA: Diagnosis not present

## 2021-01-25 DIAGNOSIS — E782 Mixed hyperlipidemia: Secondary | ICD-10-CM | POA: Diagnosis not present

## 2021-02-01 DIAGNOSIS — E892 Postprocedural hypoparathyroidism: Secondary | ICD-10-CM | POA: Diagnosis not present

## 2021-02-01 DIAGNOSIS — I10 Essential (primary) hypertension: Secondary | ICD-10-CM | POA: Diagnosis not present

## 2021-02-01 DIAGNOSIS — E1140 Type 2 diabetes mellitus with diabetic neuropathy, unspecified: Secondary | ICD-10-CM | POA: Diagnosis not present

## 2021-02-01 DIAGNOSIS — E1122 Type 2 diabetes mellitus with diabetic chronic kidney disease: Secondary | ICD-10-CM | POA: Diagnosis not present

## 2021-02-01 DIAGNOSIS — E7849 Other hyperlipidemia: Secondary | ICD-10-CM | POA: Diagnosis not present

## 2021-03-03 DIAGNOSIS — I10 Essential (primary) hypertension: Secondary | ICD-10-CM | POA: Diagnosis not present

## 2021-03-03 DIAGNOSIS — E7849 Other hyperlipidemia: Secondary | ICD-10-CM | POA: Diagnosis not present

## 2021-03-03 DIAGNOSIS — E213 Hyperparathyroidism, unspecified: Secondary | ICD-10-CM | POA: Diagnosis not present

## 2021-03-03 DIAGNOSIS — Z7984 Long term (current) use of oral hypoglycemic drugs: Secondary | ICD-10-CM | POA: Diagnosis not present

## 2021-03-03 DIAGNOSIS — E1165 Type 2 diabetes mellitus with hyperglycemia: Secondary | ICD-10-CM | POA: Diagnosis not present

## 2021-04-26 DIAGNOSIS — R5383 Other fatigue: Secondary | ICD-10-CM | POA: Diagnosis not present

## 2021-04-26 DIAGNOSIS — I10 Essential (primary) hypertension: Secondary | ICD-10-CM | POA: Diagnosis not present

## 2021-04-26 DIAGNOSIS — E782 Mixed hyperlipidemia: Secondary | ICD-10-CM | POA: Diagnosis not present

## 2021-04-26 DIAGNOSIS — E1140 Type 2 diabetes mellitus with diabetic neuropathy, unspecified: Secondary | ICD-10-CM | POA: Diagnosis not present

## 2021-05-03 DIAGNOSIS — Z1389 Encounter for screening for other disorder: Secondary | ICD-10-CM | POA: Diagnosis not present

## 2021-05-03 DIAGNOSIS — E1122 Type 2 diabetes mellitus with diabetic chronic kidney disease: Secondary | ICD-10-CM | POA: Diagnosis not present

## 2021-05-03 DIAGNOSIS — E1140 Type 2 diabetes mellitus with diabetic neuropathy, unspecified: Secondary | ICD-10-CM | POA: Diagnosis not present

## 2021-05-03 DIAGNOSIS — E213 Hyperparathyroidism, unspecified: Secondary | ICD-10-CM | POA: Diagnosis not present

## 2021-05-03 DIAGNOSIS — I10 Essential (primary) hypertension: Secondary | ICD-10-CM | POA: Diagnosis not present

## 2021-05-03 DIAGNOSIS — E7849 Other hyperlipidemia: Secondary | ICD-10-CM | POA: Diagnosis not present

## 2021-05-03 DIAGNOSIS — Z7984 Long term (current) use of oral hypoglycemic drugs: Secondary | ICD-10-CM | POA: Diagnosis not present

## 2021-05-03 DIAGNOSIS — E1165 Type 2 diabetes mellitus with hyperglycemia: Secondary | ICD-10-CM | POA: Diagnosis not present

## 2021-05-03 DIAGNOSIS — N1831 Chronic kidney disease, stage 3a: Secondary | ICD-10-CM | POA: Diagnosis not present

## 2021-05-03 DIAGNOSIS — G619 Inflammatory polyneuropathy, unspecified: Secondary | ICD-10-CM | POA: Diagnosis not present

## 2021-05-04 ENCOUNTER — Other Ambulatory Visit (HOSPITAL_COMMUNITY): Payer: Medicare Other

## 2021-05-09 ENCOUNTER — Ambulatory Visit (INDEPENDENT_AMBULATORY_CARE_PROVIDER_SITE_OTHER): Payer: Medicare Other | Admitting: Urology

## 2021-05-09 ENCOUNTER — Encounter: Payer: Self-pay | Admitting: Urology

## 2021-05-09 ENCOUNTER — Other Ambulatory Visit: Payer: Self-pay

## 2021-05-09 VITALS — BP 155/69 | HR 82

## 2021-05-09 DIAGNOSIS — N3000 Acute cystitis without hematuria: Secondary | ICD-10-CM | POA: Diagnosis not present

## 2021-05-09 DIAGNOSIS — N200 Calculus of kidney: Secondary | ICD-10-CM | POA: Diagnosis not present

## 2021-05-09 DIAGNOSIS — N202 Calculus of kidney with calculus of ureter: Secondary | ICD-10-CM

## 2021-05-09 LAB — URINALYSIS, ROUTINE W REFLEX MICROSCOPIC
Bilirubin, UA: NEGATIVE
Nitrite, UA: NEGATIVE
Specific Gravity, UA: 1.02 (ref 1.005–1.030)
Urobilinogen, Ur: 0.2 mg/dL (ref 0.2–1.0)
pH, UA: 5.5 (ref 5.0–7.5)

## 2021-05-09 LAB — MICROSCOPIC EXAMINATION: Renal Epithel, UA: NONE SEEN /hpf

## 2021-05-09 NOTE — Progress Notes (Signed)
History of Present Illness: Allison Oneill is a 70 y.o. year old female here for follow-up of urolithiasis.    3.5.2020: Underwent left PCNL--tubeless w/ J2 stent placed, discharged on POD 1.    5.19.2020: A recent renal ultrasound revealed normal-appearing bladder, no hydronephrosis or mass in either kidney. 2-3 small calculi left lower pole, 2 right renal calculi, all 6 mm in diameter or smaller. She did have a serum calcium of 10.8 in January of this year. She did say that she had a parathyroid hormone test done.    04/28/2019: She had a parthyroidectomy with Dr. Harlow Asa recently. No flank pain currently.   08.31.2021:  24-hour urine performed in June 2021 revealed low urinary citrate and exceedingly low volume.  It was recommended that she drink more water, add lemon or lime juice to this.   12.16.2021: KUB--Left mid to inferior pole calcific densities. No calcific densities overlying the right renal shadow or expected course of the ureters. Pelvic phleboliths. Nonobstructive bowel gas pattern. Multilevel Spondylosis.   6.7.2022: She denies recent gross hematuria, dysuria, flank pain.  She has been trying to drink more fluids. KUB--At least 2 faintly calcified stones overlying the lower left kidney, larger measures 14 mm and the smaller measures 7 mm.   7.22.2022: CT--1. Bulky branching calculus within the LEFT renal pelvis and extending into the lower pole calices. 2. No ureterolithiasis or obstructive uropathy.   8.23.2022: She comes in today for follow-up visit.  She has no left-sided flank pain or gross hematuria.  She is here to discuss treatment of this left-sided stone burden.  12.6.2022: She returns for follow-up.  She has not had treatment of her stone yet.  She is fearful of a stent.  No flank pain, gross hematuria or dysuria. Past Medical History:  Diagnosis Date   Anxiety    Diabetes mellitus without complication ([place])    TYPE 2   Fibromyalgia    Hypertension     Left renal stone    Neuromuscular disorder ([place])    restless leg syndrome   Neuropathy    FEET    Past Surgical History:  Procedure Laterality Date   ABDOMINAL HYSTERECTOMY  2000   COMPLETE   CYSTOSCOPY N/A 08/27/2013   Procedure: CYSTOSCOPY; REMOVAL OF BLADDER CALCULUS;  Surgeon: [name], MD;  Location: [facility];  Service: Urology;  Laterality: N/A;   CYSTOSCOPY W/ URETERAL STENT PLACEMENT N/A 2012   CYSTOSCOPY W/ URETERAL STENT REMOVAL N/A 2013   IR URETERAL STENT LEFT NEW ACCESS W/O SEP NEPHROSTOMY CATH  08/07/2018   LITHOTRIPSY  2012   NEPHROLITHOTOMY Left 08/07/2018   Procedure: NEPHROLITHOTOMY PERCUTANEOUS;  Surgeon: [name], MD;  Location: [facility];  Service: Urology;  Laterality: Left;   NEUROMA SURGERY Left 1997   PARATHYROIDECTOMY Right 02/27/2019   Procedure: RIGHT INFERIOR PARATHYROIDECTOMY;  Surgeon: [name], MD;  [facility]: [facility];  Service: General;  Laterality: Right;    Home Medications:  (Not in a hospital admission)   Allergies:  Allergies  Allergen Reactions   Sulfa Antibiotics Itching and Rash    No family history on file.  Social History:  reports that she has never smoked. She has never used smokeless tobacco. She reports that she does not drink alcohol and does not use drugs.  ROS: A complete review of systems was performed.  All systems are negative except for pertinent findings as noted.  Physical Exam:  Vital signs in last 24 hours: @VSRANGES @ General:  Alert and oriented,  No acute distress HEENT: Normocephalic, atraumatic Neck: No JVD or lymphadenopathy Cardiovascular: Regular rate  Lungs: Normal inspiratory/expiratory excursion Extremities: No edema Neurologic: Grossly intact  I have reviewed prior pt notes  I have reviewed urinalysis results  I have independently reviewed prior imaging I reviewed CT images with the patient   Impression/Assessment:  Large branched left renal stone, needs treatment  Plan:  I  discussed shockwave lithotripsy, which the patient prefers, versus percutaneous nephrolithotomy.  I let her know that prior to any lithotripsy she would need a stent.  She is quite fearful of stents and has problems with them.  So that leaves [place] with percutaneous nephrolithotomy which she is also familiar with.  I told her we could do it without having a stent placed.  We will work at getting this set up to be done in late January or February.  Lillette Boxer Christee Mervine 05/09/2021, 8:32 AM  Lillette Boxer. Arionne Iams MD

## 2021-05-09 NOTE — Progress Notes (Signed)
Urological Symptom Review  Patient is experiencing the following symptoms: Get up at night to urinate   Review of Systems  Gastrointestinal (upper)  : Negative for upper GI symptoms  Gastrointestinal (lower) : Negative for lower GI symptoms  Constitutional : Negative for symptoms  Skin: Negative for skin symptoms  Eyes: Negative for eye symptoms  Ear/Nose/Throat : Sinus problems  Hematologic/Lymphatic: Negative for Hematologic/Lymphatic symptoms  Cardiovascular : Negative for cardiovascular symptoms  Respiratory : Negative for respiratory symptoms  Endocrine: Negative for endocrine symptoms  Musculoskeletal: Negative for musculoskeletal symptoms  Neurological: Negative for neurological symptoms  Psychologic: Negative for psychiatric symptoms

## 2021-05-12 ENCOUNTER — Other Ambulatory Visit: Payer: Self-pay | Admitting: Urology

## 2021-05-16 ENCOUNTER — Other Ambulatory Visit (HOSPITAL_COMMUNITY): Payer: Self-pay | Admitting: Urology

## 2021-05-16 DIAGNOSIS — N200 Calculus of kidney: Secondary | ICD-10-CM

## 2021-05-23 DIAGNOSIS — I739 Peripheral vascular disease, unspecified: Secondary | ICD-10-CM | POA: Diagnosis not present

## 2021-05-23 DIAGNOSIS — L609 Nail disorder, unspecified: Secondary | ICD-10-CM | POA: Diagnosis not present

## 2021-05-23 DIAGNOSIS — L110 Acquired keratosis follicularis: Secondary | ICD-10-CM | POA: Diagnosis not present

## 2021-07-02 DIAGNOSIS — E1165 Type 2 diabetes mellitus with hyperglycemia: Secondary | ICD-10-CM | POA: Diagnosis not present

## 2021-07-02 DIAGNOSIS — I10 Essential (primary) hypertension: Secondary | ICD-10-CM | POA: Diagnosis not present

## 2021-07-13 NOTE — Patient Instructions (Addendum)
DUE TO COVID-19 ONLY ONE VISITOR IS ALLOWED TO COME WITH YOU AND STAY IN THE WAITING ROOM ONLY DURING PRE OP AND PROCEDURE DAY OF SURGERY IF YOU ARE GOING HOME AFTER SURGERY. IF YOU ARE SPENDING THE NIGHT 2 PEOPLE MAY VISIT WITH YOU IN YOUR PRIVATE ROOM AFTER SURGERY UNTIL VISITING  HOURS ARE OVER AT 800 PM AND 1  VISITOR  MAY  SPEND THE NIGHT.   YOU NEED TO HAVE A COVID 19 TEST ON__2/28/23_____ @_9 :30______, THIS TEST MUST BE DONE BEFORE SURGERY,                 Marlisha A Tompson     Your procedure is scheduled on: 08/03/21   Report to [facility] Main  Entrance   Report to admitting at  7:30 AM     Call this number if you have problems the morning of surgery [phone]   No food or drink after midnight .   BRUSH YOUR TEETH MORNING OF SURGERY AND RINSE YOUR MOUTH OUT, NO CHEWING GUM CANDY OR MINTS.     Take these medicines the morning of surgery with A SIP OF WATER: Zoloft, Clonazepam if needed   How to Manage Your Diabetes Before and After Surgery  Why is it important to control my blood sugar before and after surgery? Improving blood sugar levels before and after surgery helps healing and can limit problems. A way of improving blood sugar control is eating a healthy diet by:  Eating less sugar and carbohydrates  Increasing activity/exercise  Talking with your doctor about reaching your blood sugar goals High blood sugars (greater than 180 mg/dL) can raise your risk of infections and slow your recovery, so you will need to focus on controlling your diabetes during the weeks before surgery. Make sure that the doctor who takes care of your diabetes knows about your planned surgery including the date and location.  How do I manage my blood sugar before surgery? Check your blood sugar at least 4 times a day, starting 2 days before surgery, to make sure that the level is not too high or low. Check your blood sugar the morning of your surgery when you wake up and every 2  hours until you get to the Short Stay unit. If your blood sugar is less than 70 mg/dL, you will need to treat for low blood sugar: Do not take insulin. Treat a low blood sugar (less than 70 mg/dL) with  cup of clear juice (cranberry or apple), 4 glucose tablets, OR glucose gel. Recheck blood sugar in 15 minutes after treatment (to make sure it is greater than 70 mg/dL). If your blood sugar is not greater than 70 mg/dL on recheck, call [phone] for further instructions. Report your blood sugar to the short stay nurse when you get to Short Stay.  If you are admitted to the hospital after surgery: Your blood sugar will be checked by the staff and you will probably be given insulin after surgery (instead of oral diabetes medicines) to make sure you have good blood sugar levels. The goal for blood sugar control after surgery is 80-180 mg/dL.   WHAT DO I DO ABOUT MY DIABETES MEDICATION?  The day before surgery take glipizide and metformin in the morning.  Do not take the glipizide at bed.  Do not take oral diabetes medicines (pills) the morning of surgery.  You may not have any metal on your body including hair pins and              piercings  Do not wear jewelry, make-up, lotions, powders or perfumes, deodorant             Do not wear nail polish on your fingernails.  Do not shave  48 hours prior to surgery.                 Do not bring valuables to the hospital. [place].  Contacts, dentures or bridgework may not be worn into surgery.  Leave suitcase in the car. After surgery it may be brought to your room.              - Preparing for Surgery Before surgery, you can play an important role.  Because skin is not sterile, your skin needs to be as free of germs as possible.  You can reduce the number of germs on your skin by washing with CHG (chlorahexidine gluconate) soap before  surgery.  CHG is an antiseptic cleaner which kills germs and bonds with the skin to continue killing germs even after washing. Please DO NOT use if you have an allergy to CHG or antibacterial soaps.  If your skin becomes reddened/irritated stop using the CHG and inform your nurse when you arrive at Short Stay. Do not shave (including legs and underarms) for at least 48 hours prior to the first CHG shower.   Please follow these instructions carefully:  1.  Shower with CHG Soap the night before surgery and the  morning of Surgery.  2.  If you choose to wash your hair, wash your hair first as usual with your  normal  shampoo.  3.  After you shampoo, rinse your hair and body thoroughly to remove the  shampoo.                            4.  Use CHG as you would any other liquid soap.  You can apply chg directly  to the skin and wash                       Gently with a scrungie or clean washcloth.  5.  Apply the CHG Soap to your body ONLY FROM THE NECK DOWN.   Do not use on face/ open                           Wound or open sores. Avoid contact with eyes, ears mouth and genitals (private parts).                       Wash face,  Genitals (private parts) with your normal soap.             6.  Wash thoroughly, paying special attention to the area where your surgery  will be performed.  7.  Thoroughly rinse your body with warm water from the neck down.  8.  DO NOT shower/wash with your normal soap after using and rinsing off  the CHG Soap.                9.  Pat yourself dry with a clean towel.  10.  Wear clean pajamas.            11.  Place clean sheets on your bed the night of your first shower and do not  sleep with pets. Day of Surgery : Do not apply any lotions/deodorants the morning of surgery.  Please wear clean clothes to the hospital/surgery center.  FAILURE TO FOLLOW THESE INSTRUCTIONS MAY RESULT IN THE CANCELLATION OF YOUR  SURGERY  ________________________________________________________________________

## 2021-07-14 ENCOUNTER — Encounter (HOSPITAL_COMMUNITY)
Admission: RE | Admit: 2021-07-14 | Discharge: 2021-07-14 | Disposition: A | Discharge status: Home / Self Care | Payer: Medicare Other | Source: Ambulatory Visit | Attending: Urology | Admitting: Urology

## 2021-07-14 ENCOUNTER — Other Ambulatory Visit: Payer: Self-pay

## 2021-07-14 ENCOUNTER — Encounter (HOSPITAL_COMMUNITY): Payer: Self-pay

## 2021-07-14 VITALS — BP 135/67 | HR 75 | Temp 98.5°F | Resp 18 | Ht 65.0 in | Wt 165.0 lb

## 2021-07-14 DIAGNOSIS — E119 Type 2 diabetes mellitus without complications: Secondary | ICD-10-CM | POA: Diagnosis not present

## 2021-07-14 DIAGNOSIS — Z01818 Encounter for other preprocedural examination: Secondary | ICD-10-CM | POA: Insufficient documentation

## 2021-07-14 HISTORY — DX: Personal history of urinary calculi: Z87.442

## 2021-07-14 LAB — BASIC METABOLIC PANEL
Anion gap: 10 (ref 5–15)
BUN: 36 mg/dL — ABNORMAL HIGH (ref 8–23)
CO2: 25 mmol/L (ref 22–32)
Calcium: 9.9 mg/dL (ref 8.9–10.3)
Chloride: 105 mmol/L (ref 98–111)
Creatinine, Ser: 1.31 mg/dL — ABNORMAL HIGH (ref 0.44–1.00)
GFR, Estimated: 43 mL/min — ABNORMAL LOW (ref >60)
Glucose, Bld: 171 mg/dL — ABNORMAL HIGH (ref 70–99)
Potassium: 3.7 mmol/L (ref 3.5–5.1)
Sodium: 140 mmol/L (ref 135–145)

## 2021-07-14 LAB — CBC
HCT: 39.4 % (ref 36.0–46.0)
Hemoglobin: 12.9 g/dL (ref 12.0–15.0)
MCH: 30.6 pg (ref 26.0–34.0)
MCHC: 32.7 g/dL (ref 30.0–36.0)
MCV: 93.6 fL (ref 80.0–100.0)
Platelets: 240 10*3/uL (ref 150–400)
RBC: 4.21 MIL/uL (ref 3.87–5.11)
RDW: 14.2 % (ref 11.5–15.5)
WBC: 10.3 10*3/uL (ref 4.0–10.5)
nRBC: 0 % (ref 0.0–0.2)

## 2021-07-14 LAB — GLUCOSE, CAPILLARY: Glucose-Capillary: 181 mg/dL — ABNORMAL HIGH (ref 70–99)

## 2021-07-14 LAB — HEMOGLOBIN A1C
Hgb A1c MFr Bld: 7.9 % — ABNORMAL HIGH (ref 4.8–5.6)
Mean Plasma Glucose: 180.03 mg/dL

## 2021-07-14 NOTE — Progress Notes (Addendum)
COVID test- 08/01/21 at 9:30 am   Bowel prep reminder:no  PCP - Dr. Lizbeth Bark Cardiologist - none  Chest x-ray - no EKG - 07/14/21-chart Stress Test - no ECHO - 10/21/20- care everywhere Cardiac Cath - no Pacemaker/ICD device last checked:NA  Sleep Study - no CPAP -   Fasting Blood Sugar - 133/170 Checks Blood Sugar _____ times a day. Once a week  Blood Thinner Instructions:NA Aspirin Instructions: Last Dose:  Anesthesia review: yes  Patient denies shortness of breath, fever, cough and chest pain at PAT appointment Pt has no SOB with any instructions. She has a sore Rt upper arm with swelling and a scab that sometimes leaks fluid. She has told her Dr. She is not able to lift it now and the swelling is new. She plans to call him soon.   Patient verbalized understanding of instructions that were given to them at the PAT appointment. Patient was also instructed that they will need to review over the PAT instructions again at home before surgery. Yes  Pt lost her son in August of 2022 to Covid.

## 2021-07-20 DIAGNOSIS — L110 Acquired keratosis follicularis: Secondary | ICD-10-CM | POA: Diagnosis not present

## 2021-07-20 DIAGNOSIS — M79671 Pain in right foot: Secondary | ICD-10-CM | POA: Diagnosis not present

## 2021-07-20 DIAGNOSIS — M79672 Pain in left foot: Secondary | ICD-10-CM | POA: Diagnosis not present

## 2021-07-20 DIAGNOSIS — Q828 Other specified congenital malformations of skin: Secondary | ICD-10-CM | POA: Diagnosis not present

## 2021-07-27 DIAGNOSIS — E782 Mixed hyperlipidemia: Secondary | ICD-10-CM | POA: Diagnosis not present

## 2021-07-27 DIAGNOSIS — R5383 Other fatigue: Secondary | ICD-10-CM | POA: Diagnosis not present

## 2021-07-27 DIAGNOSIS — E559 Vitamin D deficiency, unspecified: Secondary | ICD-10-CM | POA: Diagnosis not present

## 2021-07-27 DIAGNOSIS — Z1329 Encounter for screening for other suspected endocrine disorder: Secondary | ICD-10-CM | POA: Diagnosis not present

## 2021-07-27 DIAGNOSIS — E1165 Type 2 diabetes mellitus with hyperglycemia: Secondary | ICD-10-CM | POA: Diagnosis not present

## 2021-07-27 DIAGNOSIS — I10 Essential (primary) hypertension: Secondary | ICD-10-CM | POA: Diagnosis not present

## 2021-07-27 DIAGNOSIS — E7849 Other hyperlipidemia: Secondary | ICD-10-CM | POA: Diagnosis not present

## 2021-08-01 ENCOUNTER — Other Ambulatory Visit: Payer: Self-pay

## 2021-08-01 ENCOUNTER — Encounter (HOSPITAL_COMMUNITY)
Admission: RE | Admit: 2021-08-01 | Discharge: 2021-08-01 | Disposition: A | Discharge status: Home / Self Care | Payer: Medicare Other | Source: Ambulatory Visit | Attending: Urology | Admitting: Urology

## 2021-08-01 DIAGNOSIS — Z01818 Encounter for other preprocedural examination: Secondary | ICD-10-CM

## 2021-08-01 DIAGNOSIS — Z01812 Encounter for preprocedural laboratory examination: Secondary | ICD-10-CM | POA: Insufficient documentation

## 2021-08-01 DIAGNOSIS — Z20822 Contact with and (suspected) exposure to covid-19: Secondary | ICD-10-CM | POA: Insufficient documentation

## 2021-08-01 LAB — SARS CORONAVIRUS 2 (TAT 6-24 HRS): SARS Coronavirus 2: NEGATIVE

## 2021-08-02 ENCOUNTER — Other Ambulatory Visit: Payer: Self-pay | Admitting: Physician Assistant

## 2021-08-02 MED ORDER — DEXTROSE 5 % IV SOLN: 2.0000 g | Freq: Once | INTRAVENOUS | Status: DC

## 2021-08-03 ENCOUNTER — Other Ambulatory Visit: Payer: Self-pay

## 2021-08-03 ENCOUNTER — Ambulatory Visit (HOSPITAL_COMMUNITY): Payer: Medicare Other

## 2021-08-03 ENCOUNTER — Encounter (HOSPITAL_COMMUNITY)
Admission: RE | Disposition: A | Discharge status: Home / Self Care | Payer: Self-pay | Source: Home / Self Care | Attending: Urology

## 2021-08-03 ENCOUNTER — Ambulatory Visit (HOSPITAL_BASED_OUTPATIENT_CLINIC_OR_DEPARTMENT_OTHER): Payer: Medicare Other | Admitting: Anesthesiology

## 2021-08-03 ENCOUNTER — Observation Stay (HOSPITAL_COMMUNITY)
Admission: RE | Admit: 2021-08-03 | Discharge: 2021-08-04 | Disposition: A | Discharge status: Home / Self Care | Payer: Medicare Other | Attending: Urology | Admitting: Urology

## 2021-08-03 ENCOUNTER — Ambulatory Visit (HOSPITAL_COMMUNITY): Payer: Medicare Other | Admitting: Physician Assistant

## 2021-08-03 ENCOUNTER — Other Ambulatory Visit (HOSPITAL_COMMUNITY): Payer: Self-pay | Admitting: Physician Assistant

## 2021-08-03 ENCOUNTER — Ambulatory Visit (HOSPITAL_COMMUNITY)
Admission: RE | Admit: 2021-08-03 | Discharge: 2021-08-03 | Disposition: A | Discharge status: Home / Self Care | Payer: Medicare Other | Source: Ambulatory Visit | Attending: Urology | Admitting: Urology

## 2021-08-03 ENCOUNTER — Encounter (HOSPITAL_COMMUNITY): Payer: Self-pay | Admitting: Urology

## 2021-08-03 DIAGNOSIS — Z7984 Long term (current) use of oral hypoglycemic drugs: Secondary | ICD-10-CM | POA: Diagnosis not present

## 2021-08-03 DIAGNOSIS — E119 Type 2 diabetes mellitus without complications: Secondary | ICD-10-CM | POA: Insufficient documentation

## 2021-08-03 DIAGNOSIS — N200 Calculus of kidney: Principal | ICD-10-CM

## 2021-08-03 DIAGNOSIS — I10 Essential (primary) hypertension: Secondary | ICD-10-CM | POA: Diagnosis not present

## 2021-08-03 DIAGNOSIS — Z79899 Other long term (current) drug therapy: Secondary | ICD-10-CM | POA: Insufficient documentation

## 2021-08-03 DIAGNOSIS — N179 Acute kidney failure, unspecified: Secondary | ICD-10-CM | POA: Diagnosis not present

## 2021-08-03 HISTORY — PX: IR URETERAL STENT LEFT NEW ACCESS W/O SEP NEPHROSTOMY CATH: IMG6075

## 2021-08-03 HISTORY — PX: NEPHROLITHOTOMY: SHX5134

## 2021-08-03 LAB — CBC
HCT: 38.2 % (ref 36.0–46.0)
Hemoglobin: 12.6 g/dL (ref 12.0–15.0)
MCH: 30.4 pg (ref 26.0–34.0)
MCHC: 33 g/dL (ref 30.0–36.0)
MCV: 92.3 fL (ref 80.0–100.0)
Platelets: 241 10*3/uL (ref 150–400)
RBC: 4.14 MIL/uL (ref 3.87–5.11)
RDW: 14.1 % (ref 11.5–15.5)
WBC: 8.1 10*3/uL (ref 4.0–10.5)
nRBC: 0 % (ref 0.0–0.2)

## 2021-08-03 LAB — GLUCOSE, CAPILLARY
Glucose-Capillary: 128 mg/dL — ABNORMAL HIGH (ref 70–99)
Glucose-Capillary: 200 mg/dL — ABNORMAL HIGH (ref 70–99)
Glucose-Capillary: 390 mg/dL — ABNORMAL HIGH (ref 70–99)

## 2021-08-03 LAB — PROTIME-INR
INR: 0.9 (ref 0.8–1.2)
Prothrombin Time: 12.2 seconds (ref 11.4–15.2)

## 2021-08-03 SURGERY — NEPHROLITHOTOMY PERCUTANEOUS
Anesthesia: General | Site: Flank | Laterality: Left

## 2021-08-03 MED ORDER — ONDANSETRON HCL 4 MG/2ML IJ SOLN: 4.0000 mg | INTRAMUSCULAR | Status: DC | PRN

## 2021-08-03 MED ORDER — SODIUM CHLORIDE 0.9 % IR SOLN
Status: DC | PRN
Start: 2021-08-03 — End: 2021-08-03
  Administered 2021-08-03: 12000 mL

## 2021-08-03 MED ORDER — DEXAMETHASONE SODIUM PHOSPHATE 10 MG/ML IJ SOLN
INTRAMUSCULAR | Status: DC | PRN
  Administered 2021-08-03: 8 mg via INTRAVENOUS

## 2021-08-03 MED ORDER — ZOLPIDEM TARTRATE 5 MG PO TABS: 5.0000 mg | ORAL_TABLET | Freq: Every evening | ORAL | Status: DC | PRN

## 2021-08-03 MED ORDER — AMISULPRIDE (ANTIEMETIC) 5 MG/2ML IV SOLN: 10.0000 mg | Freq: Once | INTRAVENOUS | Status: DC | PRN

## 2021-08-03 MED ORDER — FENTANYL CITRATE (PF) 100 MCG/2ML IJ SOLN
INTRAMUSCULAR | Status: AC
  Filled 2021-08-03: qty 4

## 2021-08-03 MED ORDER — LIDOCAINE HCL (PF) 1 % IJ SOLN
INTRAMUSCULAR | Status: AC | PRN
  Administered 2021-08-03: 7 mL

## 2021-08-03 MED ORDER — IOHEXOL 300 MG/ML  SOLN
15.0000 mL | Freq: Once | INTRAMUSCULAR | Status: AC | PRN
  Administered 2021-08-03: 15 mL

## 2021-08-03 MED ORDER — FENTANYL CITRATE (PF) 100 MCG/2ML IJ SOLN
INTRAMUSCULAR | Status: AC | PRN
  Administered 2021-08-03: 50 ug via INTRAVENOUS

## 2021-08-03 MED ORDER — SODIUM CHLORIDE (PF) 0.9 % IJ SOLN
INTRAMUSCULAR | Status: AC
  Filled 2021-08-03: qty 10

## 2021-08-03 MED ORDER — ROCURONIUM BROMIDE 10 MG/ML (PF) SYRINGE
PREFILLED_SYRINGE | INTRAVENOUS | Status: DC | PRN
Start: 2021-08-03 — End: 2021-08-03
  Administered 2021-08-03: 70 mg via INTRAVENOUS

## 2021-08-03 MED ORDER — SERTRALINE HCL 100 MG PO TABS
100.0000 mg | ORAL_TABLET | Freq: Every day | ORAL | Status: DC
  Administered 2021-08-04: 100 mg via ORAL
  Filled 2021-08-03: qty 1

## 2021-08-03 MED ORDER — PROPOFOL 10 MG/ML IV BOLUS
INTRAVENOUS | Status: DC | PRN
  Administered 2021-08-03: 120 mg via INTRAVENOUS

## 2021-08-03 MED ORDER — MIDAZOLAM HCL 2 MG/2ML IJ SOLN
INTRAMUSCULAR | Status: AC | PRN
  Administered 2021-08-03: 2 mg via INTRAVENOUS

## 2021-08-03 MED ORDER — PROPOFOL 10 MG/ML IV BOLUS
INTRAVENOUS | Status: AC
  Filled 2021-08-03: qty 20

## 2021-08-03 MED ORDER — CHLORHEXIDINE GLUCONATE 0.12 % MT SOLN
15.0000 mL | Freq: Once | OROMUCOSAL | Status: AC
  Administered 2021-08-03: 15 mL via OROMUCOSAL

## 2021-08-03 MED ORDER — ONDANSETRON HCL 4 MG/2ML IJ SOLN
INTRAMUSCULAR | Status: DC | PRN
  Administered 2021-08-03: 4 mg via INTRAVENOUS

## 2021-08-03 MED ORDER — ROSUVASTATIN CALCIUM 20 MG PO TABS
20.0000 mg | ORAL_TABLET | Freq: Every day | ORAL | Status: DC
  Administered 2021-08-03: 20 mg via ORAL
  Filled 2021-08-03: qty 1

## 2021-08-03 MED ORDER — SODIUM CHLORIDE 0.45 % IV SOLN: INTRAVENOUS | Status: DC

## 2021-08-03 MED ORDER — MIDAZOLAM HCL 2 MG/2ML IJ SOLN
INTRAMUSCULAR | Status: DC | PRN
  Administered 2021-08-03: 2 mg via INTRAVENOUS

## 2021-08-03 MED ORDER — INSULIN ASPART 100 UNIT/ML IJ SOLN
0.0000 [IU] | Freq: Three times a day (TID) | INTRAMUSCULAR | Status: DC
  Administered 2021-08-04 (×2): 2 [IU] via SUBCUTANEOUS

## 2021-08-03 MED ORDER — FENTANYL CITRATE (PF) 250 MCG/5ML IJ SOLN
INTRAMUSCULAR | Status: DC | PRN
  Administered 2021-08-03: 150 ug via INTRAVENOUS

## 2021-08-03 MED ORDER — SENNA 8.6 MG PO TABS
1.0000 | ORAL_TABLET | Freq: Two times a day (BID) | ORAL | Status: DC
  Administered 2021-08-04: 8.6 mg via ORAL
  Filled 2021-08-03 (×2): qty 1

## 2021-08-03 MED ORDER — CEPHALEXIN 500 MG PO CAPS
500.0000 mg | ORAL_CAPSULE | Freq: Two times a day (BID) | ORAL | Status: DC
  Administered 2021-08-03 – 2021-08-04 (×2): 500 mg via ORAL
  Filled 2021-08-03 (×2): qty 1

## 2021-08-03 MED ORDER — ACETAMINOPHEN 325 MG PO TABS
650.0000 mg | ORAL_TABLET | ORAL | Status: DC | PRN
  Administered 2021-08-04: 650 mg via ORAL
  Filled 2021-08-03: qty 2

## 2021-08-03 MED ORDER — OXYBUTYNIN CHLORIDE 5 MG PO TABS: 5.0000 mg | ORAL_TABLET | Freq: Three times a day (TID) | ORAL | Status: DC | PRN

## 2021-08-03 MED ORDER — ONDANSETRON HCL 4 MG/2ML IJ SOLN
INTRAMUSCULAR | Status: AC
  Filled 2021-08-03: qty 2

## 2021-08-03 MED ORDER — 0.9 % SODIUM CHLORIDE (POUR BTL) OPTIME
TOPICAL | Status: DC | PRN
  Administered 2021-08-03: 1000 mL

## 2021-08-03 MED ORDER — LIDOCAINE 2% (20 MG/ML) 5 ML SYRINGE
INTRAMUSCULAR | Status: DC | PRN
  Administered 2021-08-03: 60 mg via INTRAVENOUS

## 2021-08-03 MED ORDER — SODIUM CHLORIDE 0.9 % IV SOLN: INTRAVENOUS | Status: DC

## 2021-08-03 MED ORDER — LIDOCAINE HCL 1 % IJ SOLN
INTRAMUSCULAR | Status: AC
  Filled 2021-08-03: qty 20

## 2021-08-03 MED ORDER — HYDROCHLOROTHIAZIDE 25 MG PO TABS
25.0000 mg | ORAL_TABLET | Freq: Every day | ORAL | Status: DC
  Administered 2021-08-03 – 2021-08-04 (×2): 25 mg via ORAL
  Filled 2021-08-03 (×2): qty 1

## 2021-08-03 MED ORDER — LACTATED RINGERS IV SOLN: INTRAVENOUS | Status: DC

## 2021-08-03 MED ORDER — FENTANYL CITRATE (PF) 250 MCG/5ML IJ SOLN
INTRAMUSCULAR | Status: AC
  Filled 2021-08-03: qty 5

## 2021-08-03 MED ORDER — ONDANSETRON HCL 4 MG/2ML IJ SOLN: 4.0000 mg | Freq: Once | INTRAMUSCULAR | Status: DC | PRN

## 2021-08-03 MED ORDER — CLONAZEPAM 0.5 MG PO TABS
0.5000 mg | ORAL_TABLET | Freq: Every day | ORAL | Status: DC | PRN
  Filled 2021-08-03: qty 1

## 2021-08-03 MED ORDER — LIDOCAINE HCL (PF) 2 % IJ SOLN
INTRAMUSCULAR | Status: AC
  Filled 2021-08-03: qty 5

## 2021-08-03 MED ORDER — ORAL CARE MOUTH RINSE: 15.0000 mL | Freq: Once | OROMUCOSAL | Status: AC

## 2021-08-03 MED ORDER — SUGAMMADEX SODIUM 200 MG/2ML IV SOLN
INTRAVENOUS | Status: DC | PRN
  Administered 2021-08-03: 200 mg via INTRAVENOUS

## 2021-08-03 MED ORDER — HYDROMORPHONE HCL 1 MG/ML IJ SOLN: 0.2500 mg | INTRAMUSCULAR | Status: DC | PRN

## 2021-08-03 MED ORDER — DEXAMETHASONE SODIUM PHOSPHATE 10 MG/ML IJ SOLN
INTRAMUSCULAR | Status: AC
  Filled 2021-08-03: qty 1

## 2021-08-03 MED ORDER — ROCURONIUM BROMIDE 10 MG/ML (PF) SYRINGE
PREFILLED_SYRINGE | INTRAVENOUS | Status: AC
  Filled 2021-08-03: qty 10

## 2021-08-03 MED ORDER — HYDROMORPHONE HCL 1 MG/ML IJ SOLN: 0.5000 mg | INTRAMUSCULAR | Status: DC | PRN

## 2021-08-03 MED ORDER — CEFAZOLIN SODIUM-DEXTROSE 2-4 GM/100ML-% IV SOLN
2.0000 g | INTRAVENOUS | Status: AC
  Administered 2021-08-03: 2 g via INTRAVENOUS
  Filled 2021-08-03: qty 100

## 2021-08-03 MED ORDER — OXYCODONE HCL 5 MG PO TABS
5.0000 mg | ORAL_TABLET | ORAL | Status: DC | PRN
  Administered 2021-08-03: 5 mg via ORAL
  Filled 2021-08-03: qty 1

## 2021-08-03 MED ORDER — PHENYLEPHRINE HCL (PRESSORS) 10 MG/ML IV SOLN
INTRAVENOUS | Status: AC
  Filled 2021-08-03: qty 1

## 2021-08-03 MED ORDER — IOHEXOL 300 MG/ML  SOLN
INTRAMUSCULAR | Status: DC | PRN
  Administered 2021-08-03: 7 mL

## 2021-08-03 MED ORDER — GABAPENTIN 400 MG PO CAPS
1200.0000 mg | ORAL_CAPSULE | Freq: Every day | ORAL | Status: DC
Start: 2021-08-03 — End: 2021-08-04
  Administered 2021-08-03: 1200 mg via ORAL
  Filled 2021-08-03: qty 3

## 2021-08-03 MED ORDER — MIDAZOLAM HCL 2 MG/2ML IJ SOLN
INTRAMUSCULAR | Status: AC
  Filled 2021-08-03: qty 4

## 2021-08-03 MED ORDER — PHENYLEPHRINE 40 MCG/ML (10ML) SYRINGE FOR IV PUSH (FOR BLOOD PRESSURE SUPPORT)
PREFILLED_SYRINGE | INTRAVENOUS | Status: DC | PRN
Start: 2021-08-03 — End: 2021-08-03
  Administered 2021-08-03: 160 ug via INTRAVENOUS
  Administered 2021-08-03 (×3): 80 ug via INTRAVENOUS

## 2021-08-03 MED ORDER — LISINOPRIL 5 MG PO TABS
5.0000 mg | ORAL_TABLET | ORAL | Status: DC
Start: 2021-08-04 — End: 2021-08-04
  Administered 2021-08-04: 5 mg via ORAL
  Filled 2021-08-03: qty 1

## 2021-08-03 MED ORDER — MIDAZOLAM HCL 2 MG/2ML IJ SOLN
INTRAMUSCULAR | Status: AC
  Filled 2021-08-03: qty 2

## 2021-08-03 SURGICAL SUPPLY — 52 items
APL PRP STRL LF DISP 70% ISPRP (MISCELLANEOUS) ×4
APL SKNCLS STERI-STRIP NONHPOA (GAUZE/BANDAGES/DRESSINGS) ×2
BAG COUNTER SPONGE SURGICOUNT (BAG) IMPLANT
BAG DRN RND TRDRP ANRFLXCHMBR (UROLOGICAL SUPPLIES)
BAG SPNG CNTER NS LX DISP (BAG)
BAG URINE DRAIN 2000ML AR STRL (UROLOGICAL SUPPLIES) IMPLANT
BASKET ZERO TIP NITINOL 2.4FR (BASKET) ×1 IMPLANT
BENZOIN TINCTURE PRP APPL 2/3 (GAUZE/BANDAGES/DRESSINGS) ×4 IMPLANT
BLADE SURG 15 STRL LF DISP TIS (BLADE) ×2 IMPLANT
BLADE SURG 15 STRL SS (BLADE) ×3
BSKT STON RTRVL ZERO TP 2.4FR (BASKET)
CATH FOLEY 2W COUNCIL 20FR 5CC (CATHETERS) IMPLANT
CATH FOLEY 2W COUNCIL 5CC 16FR (CATHETERS) ×2 IMPLANT
CATH ROBINSON RED A/P 20FR (CATHETERS) IMPLANT
CATH X-FORCE N30 NEPHROSTOMY (TUBING) ×3 IMPLANT
CHLORAPREP W/TINT 26 (MISCELLANEOUS) ×5 IMPLANT
COVER SURGICAL LIGHT HANDLE (MISCELLANEOUS) ×3 IMPLANT
DRAPE C-ARM 42X120 X-RAY (DRAPES) ×3 IMPLANT
DRAPE LINGEMAN PERC (DRAPES) ×3 IMPLANT
DRAPE SURG IRRIG POUCH 19X23 (DRAPES) ×3 IMPLANT
DRSG PAD ABDOMINAL 8X10 ST (GAUZE/BANDAGES/DRESSINGS) ×4 IMPLANT
DRSG TEGADERM 8X12 (GAUZE/BANDAGES/DRESSINGS) ×6 IMPLANT
EXTRACTOR STONE 1.7FRX115CM (UROLOGICAL SUPPLIES) ×1 IMPLANT
GAUZE SPONGE 4X4 12PLY STRL (GAUZE/BANDAGES/DRESSINGS) ×2 IMPLANT
GLOVE SURG ENC TEXT LTX SZ8 (GLOVE) ×3 IMPLANT
GOWN STRL REUS W/TWL XL LVL3 (GOWN DISPOSABLE) ×3 IMPLANT
GUIDEWIRE AMPLAZ .035X145 (WIRE) ×6 IMPLANT
KIT BASIN OR (CUSTOM PROCEDURE TRAY) ×3 IMPLANT
KIT PROBE 340X3.4XDISP GRN (MISCELLANEOUS) IMPLANT
KIT PROBE TRILOGY 3.4X340 (MISCELLANEOUS)
KIT PROBE TRILOGY 3.9X350 (MISCELLANEOUS) IMPLANT
KIT TURNOVER KIT A (KITS) IMPLANT
LASER FIB FLEXIVA PULSE ID 365 (Laser) IMPLANT
LASER FIB FLEXIVA PULSE ID 550 (Laser) IMPLANT
LASER FIB FLEXIVA PULSE ID 910 (Laser) IMPLANT
MANIFOLD NEPTUNE II (INSTRUMENTS) ×3 IMPLANT
NS IRRIG 1000ML POUR BTL (IV SOLUTION) ×3 IMPLANT
PACK CYSTO (CUSTOM PROCEDURE TRAY) ×3 IMPLANT
SHEATH PEELAWAY SET 9 (SHEATH) ×3 IMPLANT
SPONGE T-LAP 4X18 ~~LOC~~+RFID (SPONGE) ×3 IMPLANT
STENT URET 6FRX26 CONTOUR (STENTS) ×2 IMPLANT
SUT SILK 2 0 30  PSL (SUTURE) ×3
SUT SILK 2 0 30 PSL (SUTURE) ×2 IMPLANT
SYR 10ML LL (SYRINGE) ×3 IMPLANT
SYR 20ML LL LF (SYRINGE) ×6 IMPLANT
TRACTIP FLEXIVA PULS ID 200XHI (Laser) IMPLANT
TRACTIP FLEXIVA PULSE ID 200 (Laser)
TRAY FOLEY MTR SLVR 16FR STAT (SET/KITS/TRAYS/PACK) ×3 IMPLANT
TUBING CONNECTING 10 (TUBING) ×6 IMPLANT
TUBING STONE CATCHER TRILOGY (MISCELLANEOUS) IMPLANT
TUBING UROLOGY SET (TUBING) ×3 IMPLANT
WATER STERILE IRR 1000ML POUR (IV SOLUTION) ×3 IMPLANT

## 2021-08-03 NOTE — H&P (Signed)
H&P ? ?Chief Complaint: Left kidney stone ? ?History of Present Illness: Allison Oneill is a 71 y.o. year old female presenting for percutaneous management of a large left renal pelvic and lower calyceal branched calculus.  This is recurrent. ? ?Past Medical History:  ?Diagnosis Date  ? Anxiety   ? Diabetes mellitus without complication ([place])   ? TYPE 2  ? Fibromyalgia   ? History of kidney stones   ? Hypertension   ? Left renal stone   ? Neuromuscular disorder ([place])   ? restless leg syndrome  ? Neuropathy   ? FEET  ? ? ?Past Surgical History:  ?Procedure Laterality Date  ? ABDOMINAL HYSTERECTOMY  2000  ? COMPLETE  ? CYSTOSCOPY N/A 08/27/2013  ? Procedure: CYSTOSCOPY; REMOVAL OF BLADDER CALCULUS;  Surgeon: [name], MD;  Location: [facility];  Service: Urology;  Laterality: N/A;  ? CYSTOSCOPY W/ URETERAL STENT PLACEMENT N/A 2012  ? CYSTOSCOPY W/ URETERAL STENT REMOVAL N/A 2013  ? IR URETERAL STENT LEFT NEW ACCESS W/O SEP NEPHROSTOMY CATH  08/07/2018  ? LITHOTRIPSY  2012  ? NEPHROLITHOTOMY Left 08/07/2018  ? Procedure: NEPHROLITHOTOMY PERCUTANEOUS;  Surgeon: [name], MD;  Location: [facility];  Service: Urology;  Laterality: Left;  ? [place]  ? PARATHYROIDECTOMY Right 02/27/2019  ? Procedure: RIGHT INFERIOR PARATHYROIDECTOMY;  Surgeon: [name], MD;  Location: [facility];  Service: General;  Laterality: Right;  ? ? ?Home Medications:  ?No medications prior to admission.  ? ? ?Allergies:  ?Allergies  ?Allergen Reactions  ? Sulfa Antibiotics Itching and Rash  ? ? ?No family history on file. ? ?Social History:  reports that she has never smoked. She has never used smokeless tobacco. She reports that she does not drink alcohol and does not use drugs. ? ?ROS: ?A complete review of systems was performed.  All systems are negative except for pertinent findings as noted. ? ?Physical Exam:  ?Vital signs in last 24 hours: ?  ?General:  Alert and oriented, No acute distress ?HEENT: Normocephalic,  atraumatic ?Neck: No JVD or lymphadenopathy ?Cardiovascular: Regular rate  ?Lungs: Normal inspiratory/expiratory excursion ?Abdomen: Soft, nontender, nondistended, no abdominal masses ?Back: No CVA tenderness ?Extremities: No edema ?Neurologic: Grossly intact ? ?I have reviewed prior pt notes ? ?I have reviewed notes from referring/previous physicians ? ?I have reviewed urinalysis results ? ?I have independently reviewed prior imaging ? ?I have reviewed prior urine culture  ? ?Impression/Assessment:  ?Staghorn calculus in left kidney ? ?Plan:  ?Left percutaneous nephrolithotomy ? ?Lillette Boxer Markeeta Scalf ?08/03/2021, 7:12 AM  ?Lillette Boxer. Tonya Carlile MD ?  ?

## 2021-08-03 NOTE — Anesthesia Procedure Notes (Signed)
Procedure Name: Intubation ?Date/Time: 08/03/2021 12:40 PM ?Performed by: Sharlette Dense, CRNA ?Pre-anesthesia Checklist: Patient identified, Emergency Drugs available, Suction available and Patient being monitored ?Patient Re-evaluated:Patient Re-evaluated prior to induction ?Oxygen Delivery Method: Circle system utilized ?Preoxygenation: Pre-oxygenation with 100% oxygen ?Induction Type: IV induction ?Ventilation: Mask ventilation without difficulty ?Laryngoscope Size: Sabra Heck and 2 ?Grade View: Grade I ?Tube type: Oral ?Tube size: 7.0 mm ?Number of attempts: 1 ?Airway Equipment and Method: Stylet ?Placement Confirmation: ETT inserted through vocal cords under direct vision, positive ETCO2 and breath sounds checked- equal and bilateral ?Secured at: 21 cm ?Tube secured with: Tape ?Dental Injury: Teeth and Oropharynx as per pre-operative assessment  ? ? ? ? ?

## 2021-08-03 NOTE — Interval H&P Note (Signed)
History and Physical Interval Note: ? ?08/03/2021 ?12:12 PM ? ?Allison Oneill  has presented today for surgery, with the diagnosis of LEFT RENAL CALCULI.  The various methods of treatment have been discussed with the patient and family. After consideration of risks, benefits and other options for treatment, the patient has consented to  Procedure(s): ?NEPHROLITHOTOMY PERCUTANEOUS (Left) ?HOLMIUM LASER APPLICATION (Left) as a surgical intervention.  The patient's history has been reviewed, patient examined, no change in status, stable for surgery.  I have reviewed the patient's chart and labs.  Questions were answered to the patient's satisfaction.   ? ? ?Lillette Boxer Damond Borchers ? ? ?

## 2021-08-03 NOTE — Procedures (Signed)
Pre Procedure Dx: Left sided nephrolithiasis ?Post Procedural Dx: Same ? ?Successful [place] and fluoroscopic guided placement of a left sided 5 Fr catheter to the level of the urinary bladder for impending PNCL.   ? ?EBL: Trace ?Complications: None immediate. ? ?Ronny Bacon, MD ?Pager #: [phone] ? ?  ? ?

## 2021-08-03 NOTE — Discharge Instructions (Signed)
DISCHARGE INSTRUCTIONS FOR PERCUTANEOUS STONE SURGERY MEDICATIONS:  1. DO NOT RESUME YOUR IBUPROFEN, or any other medicines like aspirin, motrin, excedrin, advil, aleve, vitamin E, fish oil as these can all cause bleeding x 10 days.  2. Resume all your other meds from home - except do not take any other pain meds that you may have at home.  ACTIVITY 1. No strenuous activity, sexual activity, or lifting greater than 10 pounds for 2 weeks. 2. No driving while on narcotic pain medications 3. Drink plenty of water 4. Continue to walk at home - you can still get blood clots when you are at home, so keep active, but don't over do it. 5. May return to work in 1 week (but not heavy or strenuous activity).   BATHING You can shower.  Cover your wound with a dressing and remove the dressing immediately after the shower.  Do not submerge wound under water.   WOUND CARE Your wound will drain bloody fluid and may do so for 7-14 days. You have 2 options for dressings:  1. You may use gauze and tape to dress your wound.  If you choose this method, then change the dressing as it becomes soaked.  Change it at least once daily until it stops draining. You may switch to a Band Aid once drainage stops. 2. If drainage is copious, you may use an ostomy device.  This is a bag with an andhesive circle.  The circle has a hole in the middle of it and you cut the hole to the size needed to fit the wound.  This will collect the drainage in the bag and allow you to drain the bag as needed.   SIGNS/SYMPTOMS TO CALL: Please call us if you have a fever greater than 101.5, uncontrolled nausea/vomiting, uncontrolled pain, dizziness, unable to urinate, bloody urine, chest pain, shortness of breath, leg swelling, leg pain, redness around wound, drainage from wound, or any other concerns or questions. You can reach us at [phone].   

## 2021-08-03 NOTE — Anesthesia Postprocedure Evaluation (Signed)
Anesthesia Post Note ? ?Patient: Allison Oneill ? ?Procedure(s) Performed: NEPHROLITHOTOMY PERCUTANEOUS (Left: Flank) ? ?  ? ?Patient location during evaluation: PACU ?Anesthesia Type: General ?Level of consciousness: awake and alert and oriented ?Pain management: pain level controlled ?Vital Signs Assessment: post-procedure vital signs reviewed and stable ?Respiratory status: spontaneous breathing, nonlabored ventilation and respiratory function stable ?Cardiovascular status: blood pressure returned to baseline and stable ?Postop Assessment: no apparent nausea or vomiting ?Anesthetic complications: no ? ? ?No notable events documented. ? ?Last Vitals:  ?Vitals:  ? 08/03/21 1445 08/03/21 1500  ?BP: (!) 143/66 138/64  ?Pulse: 73 69  ?Resp: 18 11  ?Temp:  (!) 36.4 ?C  ?SpO2: 95% 93%  ?  ?Last Pain:  ?Vitals:  ? 08/03/21 1500  ?TempSrc:   ?PainSc: 0-No pain  ? ? ?  ?  ?  ?  ?  ?  ? ?[name] A. ? ? ? ? ?

## 2021-08-03 NOTE — Anesthesia Preprocedure Evaluation (Signed)
Anesthesia Evaluation  ?Patient identified by MRN, date of birth, ID band ?Patient awake ? ? ? ?Reviewed: ?Allergy & Precautions, NPO status , Patient's Chart, lab work & pertinent test results ? ?Airway ?Mallampati: I ? ?TM Distance: >3 FB ?Neck ROM: Full ? ? ? Dental ?no notable dental hx. ?(+) Teeth Intact, Dental Advisory Given ?  ?Pulmonary ?neg pulmonary ROS,  ?  ?Pulmonary exam normal ?breath sounds clear to auscultation ? ? ? ? ? ? Cardiovascular ?hypertension, Pt. on medications ?Normal cardiovascular exam ?Rhythm:Regular Rate:Normal ? ? ?  ?Neuro/Psych ?Anxiety Peripheral neuropathy ?Restless legs syndrome ? Neuromuscular disease   ? GI/Hepatic ?Neg liver ROS,   ?Endo/Other  ?diabetes, Well Controlled, Type 2, Oral Hypoglycemic AgentsHyperlipidemia ?Hx/o Primary hyperparathyroidism S/P Parathyroidectomy ? Renal/GU ?Renal diseaseLeft nephrolithiasis  ?negative genitourinary ?  ?Musculoskeletal ? ?(+) Fibromyalgia - ? Abdominal ?  ?Peds ? Hematology ?negative hematology ROS ?(+)   ?Anesthesia Other Findings ? ? Reproductive/Obstetrics ? ?  ? ? ? ? ? ? ? ? ? ? ? ? ? ?  ?  ? ? ? ? ? ? ? ? ?Anesthesia Physical ?Anesthesia Plan ? ?ASA: 2 ? ?Anesthesia Plan: General  ? ?Post-op Pain Management:   ? ?Induction: Intravenous ? ?PONV Risk Score and Plan: 4 or greater and Treatment may vary due to age or medical condition, Ondansetron and Dexamethasone ? ?Airway Management Planned: Oral ETT ? ?Additional Equipment:  ? ?Intra-op Plan:  ? ?Post-operative Plan: Extubation in OR ? ?Informed Consent: I have reviewed the patients History and Physical, chart, labs and discussed the procedure including the risks, benefits and alternatives for the proposed anesthesia with the patient or authorized representative who has indicated his/her understanding and acceptance.  ? ? ? ?Dental advisory given ? ?Plan Discussed with: CRNA and Anesthesiologist ? ?Anesthesia Plan Comments:    ? ? ? ? ? ? ?Anesthesia Quick Evaluation ? ?

## 2021-08-03 NOTE — Transfer of Care (Signed)
Immediate Anesthesia Transfer of Care Note ? ?Patient: Allison Oneill ? ?Procedure(s) Performed: NEPHROLITHOTOMY PERCUTANEOUS (Left: Flank) ? ?Patient Location: PACU ? ?Anesthesia Type:General ? ?Level of Consciousness: drowsy ? ?Airway & Oxygen Therapy: Patient Spontanous Breathing and Patient connected to face mask oxygen ? ?Post-op Assessment: Report given to RN and Post -op Vital signs reviewed and stable ? ?Post vital signs: Reviewed and stable ? ?Last Vitals:  ?Vitals Value Taken Time  ?BP 162/64 08/03/21 1415  ?Temp    ?Pulse 85 08/03/21 1417  ?Resp 16 08/03/21 1417  ?SpO2 92 % 08/03/21 1417  ?Vitals shown include unvalidated device data. ? ?Last Pain:  ?Vitals:  ? 08/03/21 0753  ?TempSrc: Oral  ?   ? ?  ? ?Complications: No notable events documented. ?

## 2021-08-03 NOTE — H&P (Signed)
? ? ?Referring Physician(s): ?Dahlstedt,S ? ?Supervising Physician: Sandi Mariscal ? ?Patient Status:  [facility] TBA ? ?Chief Complaint: ?Left renal stone ? ? ?Subjective: ?Patient familiar to IR service from left nephroureteral catheter placement in 2020.  She has a history of anxiety, diabetes, fibromyalgia, hypertension, restless leg syndrome, neuropathy and nephrolithiasis with recurrent  large left renal pelvic and lower calyceal branched calculus.  She is scheduled today for left percutaneous nephrostomy/nephroureteral catheter placement prior to nephrolithotomy.  She currently denies fever, headache, chest pain, dyspnea, cough, nausea, vomiting or bleeding.  She does have some generalized abdominal discomfort on palpation and right flank discomfort.  She currently denies any left flank discomfort. ? ?Past Medical History:  ?Diagnosis Date  ? Anxiety   ? Diabetes mellitus without complication ([place])   ? TYPE 2  ? Fibromyalgia   ? History of kidney stones   ? Hypertension   ? Left renal stone   ? Neuromuscular disorder ([place])   ? restless leg syndrome  ? Neuropathy   ? FEET  ? ?Past Surgical History:  ?Procedure Laterality Date  ? ABDOMINAL HYSTERECTOMY  2000  ? COMPLETE  ? CYSTOSCOPY N/A 08/27/2013  ? Procedure: CYSTOSCOPY; REMOVAL OF BLADDER CALCULUS;  Surgeon: [name], MD;  Location: [facility];  Service: Urology;  Laterality: N/A;  ? CYSTOSCOPY W/ URETERAL STENT PLACEMENT N/A 2012  ? CYSTOSCOPY W/ URETERAL STENT REMOVAL N/A 2013  ? IR URETERAL STENT LEFT NEW ACCESS W/O SEP NEPHROSTOMY CATH  08/07/2018  ? LITHOTRIPSY  2012  ? NEPHROLITHOTOMY Left 08/07/2018  ? Procedure: NEPHROLITHOTOMY PERCUTANEOUS;  Surgeon: [name], MD;  Location: [facility];  Service: Urology;  Laterality: Left;  ? [place]  ? PARATHYROIDECTOMY Right 02/27/2019  ? Procedure: RIGHT INFERIOR PARATHYROIDECTOMY;  Surgeon: [name], MD;  Location: [facility];  Service: General;  Laterality: Right;  ? ? ? ? ?Allergies: ?Sulfa  antibiotics ? ?Medications: ?Prior to Admission medications   ?Medication Sig Start Date End Date Taking? Authorizing Provider  ?ACCU-CHEK GUIDE test strip daily. 01/29/20  Yes [provider]  ?Blood Glucose Monitoring Suppl (ACCU-CHEK GUIDE ME) w/Device KIT daily. 01/29/20  Yes [provider]  ?fexofenadine (ALLEGRA) 180 MG tablet Take 180 mg by mouth at bedtime.    Yes [provider]  ?fluticasone (FLONASE) 50 MCG/ACT nasal spray Place 2 sprays into both nostrils daily as needed for allergies. 05/03/21  Yes [provider]  ?gabapentin (NEURONTIN) 300 MG capsule Take 1,200 mg by mouth at bedtime.   Yes [provider]  ?glipiZIDE (GLUCOTROL XL) 10 MG 24 hr tablet Take 10 mg by mouth in the morning and at bedtime. 06/08/18  Yes [provider]  ?hydrochlorothiazide (HYDRODIURIL) 25 MG tablet Take 25 mg by mouth daily.   Yes [provider]  ?lisinopril (PRINIVIL,ZESTRIL) 5 MG tablet Take 5 mg by mouth every morning. 06/08/18  Yes [provider]  ?metFORMIN (GLUCOPHAGE-XR) 500 MG 24 hr tablet Take 500 mg by mouth in the morning and at bedtime. 06/08/18  Yes [provider]  ?rosuvastatin (CRESTOR) 20 MG tablet Take 20 mg by mouth at bedtime. 04/15/20  Yes [provider]  ?sertraline (ZOLOFT) 100 MG tablet Take 100 mg by mouth daily.   Yes [provider]  ?clonazePAM (KLONOPIN) 1 MG tablet Take 0.5-1 mg by mouth daily as needed for anxiety (panic attacks).    [provider]  ?Lancets Misc. (ACCU-CHEK FASTCLIX LANCET) KIT daily. 01/29/20   [provider]  ? ? ? ?  Vital Signs: ?BP 119/62   Pulse 72   Temp 98.7 ?F (37.1 ?C) (Oral)   Resp 16   SpO2 96%  ? ?Physical Exam awake, alert.  Chest clear to auscultation bilaterally.  Heart with regular rate and rhythm.  Abdomen soft, positive bowel sounds, some mild generalized tenderness to palpation.  Some mild rt flank discomfort.  No lower extremity  edema. ? ?Imaging: ?No results found. ? ?Labs: ? ?CBC: ?Recent Labs  ?  07/14/21 ?1148 08/03/21 ?0803  ?WBC 10.3 8.1  ?HGB 12.9 12.6  ?HCT 39.4 38.2  ?PLT 240 241  ? ? ?COAGS: ?No results for input(s): INR, APTT in the last 8760 hours. ? ?BMP: ?Recent Labs  ?  07/14/21 ?1148  ?NA 140  ?K 3.7  ?CL 105  ?CO2 25  ?GLUCOSE 171*  ?BUN 36*  ?CALCIUM 9.9  ?CREATININE 1.31*  ?GFRNONAA 43*  ? ? ?LIVER FUNCTION TESTS: ?No results for input(s): BILITOT, AST, ALT, ALKPHOS, PROT, ALBUMIN in the last 8760 hours. ? ?Assessment and Plan: ?Patient familiar to IR service from left nephroureteral catheter placement in 2020.  She has a history of anxiety, diabetes, fibromyalgia, hypertension, restless leg syndrome, neuropathy and nephrolithiasis with recurrent  large left renal pelvic and lower calyceal branched calculus.  She is scheduled today for left percutaneous nephrostomy/nephroureteral catheter placement prior to nephrolithotomy. Risks and benefits of left PCN placement was discussed with the patient including, but not limited to, infection, bleeding, significant bleeding causing loss or decrease in renal function or damage to adjacent structures.  ? ?All of the patient's questions were answered, patient is agreeable to proceed. ? ?Consent signed and in chart. ? ? ? ? ? ?Electronically Signed: ?Autumn Messing, PA-C ?08/03/2021, 8:36 AM ? ? ?I spent a total of 25 Minutes at the the patient's bedside AND on the patient's hospital floor or unit, greater than 50% of which was counseling/coordinating care for left percutaneous nephrostomy/nephroureteral catheter placement ? ? ? ? ? ?

## 2021-08-03 NOTE — Op Note (Signed)
Preoperative diagnosis: Large left renal calculi ? ?Postoperative diagnosis: Same ? ?Principal procedure: Percutaneous nephrolithotomy, left-19 mm stone.  Antegrade nephrostogram, left.  Placement of 6 French by 26 cm contour double-J stent on left.  Fluoroscopic interpretation ? ?Surgeon: Diona Fanti ? ?Anesthesia: General endotracheal ? ?Complications: None ? ?Specimen: Stone fragments ? ?Estimated blood loss: Less than 150 mL ? ?Drains: 53 French council tip catheter as left nephrostomy tube, above-mentioned stent ? ?Indications: 71 year old female with recurrent urolithiasis.  On recent follow-up she has an enlarging, 19 mm left renal calculus with branching, involving the renal pelvis and the lower pole calyceal system.  She presents at this time for left percutaneous nephrostomy and nephrolithotomy.  She is aware of the procedure, having had this done in the past.  Risks and complications have been discussed with her.  These include but are not limited to bleeding, need for transfusion, infection, repeat procedure, placement of stent, among others.  She understands and desires to proceed. ? ?Description of procedure: The patient if first presented to interventional radiology where Dr. Sandi Mariscal placed nephrostomy tube, entering through the left lower pole calyceal system where the lower stone burden was.  She was then taken to the short stay area where she was properly identified and marked.  She had received preoperative IV antibiotics in the interventional radiology suite.  She was taken to the operating room where general endotracheal anesthetic was administered.  75 French Foley catheter placed draining the bladder.  She was then transferred to the operating room table and placed in the prone position where all extremities and pressure points were padded appropriately.  Left flank was prepped and draped around the nephrostomy tube.  At this point proper timeout was performed.  The nephrostomy tube was  uncapped, and I guided a Bentson Super Stiff guidewire through the nephrostomy tube and into the bladder where a curl was seen fluoroscopically.  The Kumpe/nephrostomy tube was then removed over top of the guidewire.  Small incision made lateral to the guidewire, subcutaneous tissue dissected with hemostats.  The peel-away ureteral access sheath was then placed over top of the guidewire.  The core was removed and a second guidewire was placed fluoroscopically down to the bladder.  The peel-away sheath was removed.  I then passed the NephroMax balloon fluoroscopically up to the area of the stone.  Balloon inflated to 16 atm of pressure.  The nephrostomy access sheath was then advanced over top of the inflated balloon up to the stone.  The balloon was deflated and removed.  Rigid nephroscope was passed through the access catheter/sheath up to the stone.  2-3 moderate-sized stones were grasped and extracted without having to fragment him.  The larger stone, in the renal pelvis was fragmented with the trilogy lithotrite and all fragments were removed.  The flexible scope was then passed.  All calyces were inspected and no further stone matter was present.  It was guided down the proximal ureter, and there were no stones there either.  Once I was positive that all accessible calyces were free of stone, I removed the flexible scope.  The working guidewire was backloaded through the rigid nephroscope and I passed, antegrade, a 26 cm x 6 French contour double-J stent with a tether removed.  This was guided into the bladder fluoroscopically.  Once positioned there with the working guidewire was removed and excellent curl was seen distally in the bladder using fluoroscopy and proximally using the rigid nephroscope.  At this point I guided a  77 [place] council tip catheter into the renal pelvis fluoroscopically.  Nephrogram was performed using a 50-50 solution of saline and Omnipaque.  There was no extravasation.  There was  easy flow down to the ureter.  I then placed 3 cc of saline/contrast in the balloon.  This inflated in the lower pole calyx.  The access catheter was then removed.  I confirmed placement of the council tip catheter again using fluoroscopy.  At this point the wound was closed using 0 silk suture placed in a vertical mattress fashion.  It was sutured to the skin with the same silk.  Dry sterile dressing was then placed.  The catheter was easily irrigated with a small amount of saline and drained appropriately.  Catheter was then placed to bag drainage.  The procedure was then terminated.  The patient was transferred to the supine position.  She was extubated, awakened, and taken to the PACU in stable condition, having tolerated the procedure well. ?

## 2021-08-04 ENCOUNTER — Encounter (HOSPITAL_COMMUNITY): Payer: Self-pay | Admitting: Urology

## 2021-08-04 DIAGNOSIS — N200 Calculus of kidney: Secondary | ICD-10-CM | POA: Diagnosis not present

## 2021-08-04 DIAGNOSIS — Z79899 Other long term (current) drug therapy: Secondary | ICD-10-CM | POA: Diagnosis not present

## 2021-08-04 DIAGNOSIS — N179 Acute kidney failure, unspecified: Secondary | ICD-10-CM | POA: Diagnosis not present

## 2021-08-04 DIAGNOSIS — E119 Type 2 diabetes mellitus without complications: Secondary | ICD-10-CM | POA: Diagnosis not present

## 2021-08-04 DIAGNOSIS — Z7984 Long term (current) use of oral hypoglycemic drugs: Secondary | ICD-10-CM | POA: Diagnosis not present

## 2021-08-04 DIAGNOSIS — I10 Essential (primary) hypertension: Secondary | ICD-10-CM | POA: Diagnosis not present

## 2021-08-04 LAB — GLUCOSE, CAPILLARY
Glucose-Capillary: 117 mg/dL — ABNORMAL HIGH (ref 70–99)
Glucose-Capillary: 137 mg/dL — ABNORMAL HIGH (ref 70–99)

## 2021-08-04 LAB — HEMOGLOBIN AND HEMATOCRIT, BLOOD
HCT: 35.5 % — ABNORMAL LOW (ref 36.0–46.0)
Hemoglobin: 11.8 g/dL — ABNORMAL LOW (ref 12.0–15.0)

## 2021-08-04 MED ORDER — TRAMADOL HCL 50 MG PO TABS: 50.0000 mg | ORAL_TABLET | Freq: Four times a day (QID) | ORAL | 0 refills | Status: DC | PRN

## 2021-08-04 MED ORDER — CEPHALEXIN 500 MG PO CAPS: 500.0000 mg | ORAL_CAPSULE | Freq: Two times a day (BID) | ORAL | 0 refills | Status: DC

## 2021-08-04 NOTE — Progress Notes (Signed)
Patient has been taught and explained discharge instructions. Patient has no further questions at this time. IV on both left and right hand has been removed.  ? ?Layla Maw, RN ?

## 2021-08-05 DIAGNOSIS — Z23 Encounter for immunization: Secondary | ICD-10-CM | POA: Diagnosis not present

## 2021-08-05 DIAGNOSIS — S1093XA Contusion of unspecified part of neck, initial encounter: Secondary | ICD-10-CM | POA: Diagnosis not present

## 2021-08-05 DIAGNOSIS — M47812 Spondylosis without myelopathy or radiculopathy, cervical region: Secondary | ICD-10-CM | POA: Diagnosis not present

## 2021-08-05 DIAGNOSIS — Z882 Allergy status to sulfonamides status: Secondary | ICD-10-CM | POA: Diagnosis not present

## 2021-08-05 DIAGNOSIS — R9082 White matter disease, unspecified: Secondary | ICD-10-CM | POA: Diagnosis not present

## 2021-08-05 DIAGNOSIS — S1091XA Abrasion of unspecified part of neck, initial encounter: Secondary | ICD-10-CM | POA: Diagnosis not present

## 2021-08-05 DIAGNOSIS — M25511 Pain in right shoulder: Secondary | ICD-10-CM | POA: Diagnosis not present

## 2021-08-05 DIAGNOSIS — S0081XA Abrasion of other part of head, initial encounter: Secondary | ICD-10-CM | POA: Diagnosis not present

## 2021-08-05 DIAGNOSIS — S40011A Contusion of right shoulder, initial encounter: Secondary | ICD-10-CM | POA: Diagnosis not present

## 2021-08-05 DIAGNOSIS — Z043 Encounter for examination and observation following other accident: Secondary | ICD-10-CM | POA: Diagnosis not present

## 2021-08-05 DIAGNOSIS — S0990XA Unspecified injury of head, initial encounter: Secondary | ICD-10-CM | POA: Diagnosis not present

## 2021-08-05 DIAGNOSIS — S00411A Abrasion of right ear, initial encounter: Secondary | ICD-10-CM | POA: Diagnosis not present

## 2021-08-07 DIAGNOSIS — E1140 Type 2 diabetes mellitus with diabetic neuropathy, unspecified: Secondary | ICD-10-CM | POA: Diagnosis not present

## 2021-08-07 DIAGNOSIS — E1122 Type 2 diabetes mellitus with diabetic chronic kidney disease: Secondary | ICD-10-CM | POA: Diagnosis not present

## 2021-08-07 DIAGNOSIS — S0083XA Contusion of other part of head, initial encounter: Secondary | ICD-10-CM | POA: Diagnosis not present

## 2021-08-07 DIAGNOSIS — I10 Essential (primary) hypertension: Secondary | ICD-10-CM | POA: Diagnosis not present

## 2021-08-07 DIAGNOSIS — M25611 Stiffness of right shoulder, not elsewhere classified: Secondary | ICD-10-CM | POA: Diagnosis not present

## 2021-08-07 DIAGNOSIS — E892 Postprocedural hypoparathyroidism: Secondary | ICD-10-CM | POA: Diagnosis not present

## 2021-08-07 LAB — GLUCOSE, CAPILLARY: Glucose-Capillary: 127 mg/dL — ABNORMAL HIGH (ref 70–99)

## 2021-08-10 ENCOUNTER — Telehealth: Payer: Self-pay

## 2021-08-10 NOTE — Telephone Encounter (Signed)
Patient left voicemail requesting antibiotic refill. ? ?Returned patient call- no answer.  ? ?Patient was given antibiotics on 03/03 for 6 doses ?

## 2021-08-15 NOTE — Telephone Encounter (Signed)
Patient called and left message from MD ?

## 2021-08-15 NOTE — Discharge Summary (Signed)
Patient ID: ?[place] ?MRN: [id number] ?DOB/AGE: Jul 10, 1950 71 y.o. ? ?Admit date: 08/03/2021 ?Discharge date: 08/15/2021 ? ?Primary Care Physician:  Curlene Labrum, MD ? ?Discharge Diagnoses: Renal calculi ?Present on Admission: ?**None** ? ? ? ? ?Discharge Medications: ?Allergies as of 08/04/2021   ? ?   Reactions  ? Sulfa Antibiotics Itching, Rash  ? ?  ? ?  ?Medication List  ?  ? ?TAKE these medications   ? ?Accu-Chek [employer] Kit ?daily. ?  ?Accu-Chek Guide Me w/Device Kit ?daily. ?  ?Accu-Chek Guide test strip ?Generic drug: glucose blood ?daily. ?  ?cephALEXin 500 MG capsule ?Commonly known as: KEFLEX ?Take 1 capsule (500 mg total) by mouth 2 (two) times daily. ?  ?clonazePAM 1 MG tablet ?Commonly known as: KLONOPIN ?Take 0.5-1 mg by mouth daily as needed for anxiety (panic attacks). ?  ?fexofenadine 180 MG tablet ?Commonly known as: ALLEGRA ?Take 180 mg by mouth at bedtime. ?  ?fluticasone 50 MCG/ACT nasal spray ?Commonly known as: FLONASE ?Place 2 sprays into both nostrils daily as needed for allergies. ?  ?gabapentin 300 MG capsule ?Commonly known as: NEURONTIN ?Take 1,200 mg by mouth at bedtime. ?  ?glipiZIDE 10 MG 24 hr tablet ?Commonly known as: GLUCOTROL XL ?Take 10 mg by mouth in the morning and at bedtime. ?  ?hydrochlorothiazide 25 MG tablet ?Commonly known as: HYDRODIURIL ?Take 25 mg by mouth daily. ?  ?lisinopril 5 MG tablet ?Commonly known as: ZESTRIL ?Take 5 mg by mouth every morning. ?  ?metFORMIN 500 MG 24 hr tablet ?Commonly known as: GLUCOPHAGE-XR ?Take 500 mg by mouth in the morning and at bedtime. ?  ?rosuvastatin 20 MG tablet ?Commonly known as: CRESTOR ?Take 20 mg by mouth at bedtime. ?  ?sertraline 100 MG tablet ?Commonly known as: ZOLOFT ?Take 100 mg by mouth daily. ?  ?traMADol 50 MG tablet ?Commonly known as: Ultram ?Take 1 tablet (50 mg total) by mouth every 6 (six) hours as needed. ?  ? ?  ? ? ? ?Significant Diagnostic Studies:  ?DG C-Arm 1-60 Min-No Report ? ?Result  Date: 08/03/2021 ?Fluoroscopy was utilized by the requesting physician.  No radiographic interpretation.  ? ?IR URETERAL STENT LEFT NEW ACCESS W/O SEP NEPHROSTOMY CATH ? ?Result Date: 08/03/2021 ?INDICATION: Renal stones, access for left-sided percutaneous nephrolithotomy. Note, patient with history of previous nephrolithotomy procedure performed 08/06/2020. EXAM: 1. ULTRASOUND AND FLUOROSCOPIC GUIDANCE FOR PUNCTURE OF THE LEFT SIDED RENAL COLLECTING SYSTEM. 2. FLUOROSCOPIC GUIDED PLACEMENT OF A LEFT SIDED NEPHROURETERAL CATHETER. COMPARISON:  CT of the abdomen and pelvis-01/01/2021 Abdominal radiograph-11/08/2020 Left-sided nephroureteral catheter placement-08/06/2020. MEDICATIONS: Ancef 2 g IV; The antibiotic was administered in an appropriate time frame prior to skin puncture. ANESTHESIA/SEDATION: Moderate (conscious) sedation was employed during this procedure as administered by the Interventional Radiology RN. A total of Versed 2 mg and Fentanyl 50 mcg was administered intravenously. Moderate Sedation Time: 25 minutes. The patient's level of consciousness and vital signs were monitored continuously by radiology nursing throughout the procedure under my direct supervision. CONTRAST:  34m OMNIPAQUE IOHEXOL 300 MG/ML SOLN - Administered into the renal collecting system. FLUOROSCOPY TIME:  6 minutes (95 mGy) COMPLICATIONS: None immediate. PROCEDURE: Informed written consent was obtained from the patient after a discussion of the risks, benefits, and alternatives to treatment. The flank flank region was prepped with Betadine in a sterile fashion, and a sterile drape was applied covering the operative field. A sterile gown and sterile gloves were used for the procedure. A timeout was performed  prior to the initiation of the procedure. A pre procedural spot fluoroscopic image was obtained of the upper abdomen. Ultrasound scanning performed of the kidney was negative for significant hydronephrosis. As such, the  collections of stones within the posteroinferior calyx were targeted fluoroscopically with a [place] needle. Access to the left renal collecting system was confirmed with ultrasound evaluation as well as limited contrast injection. An 018 wire was advanced to the level of the left renal pelvis. The needle was exchanged for the inner 3 French catheter from an [place] and contrast injection confirmed access. A small amount of air was injected into the collecting system to help delineate a posterior calyx. A posterior inferior calyx was targeted with a 22 gauge Chiba needle. Access to the calyx was confirmed with advancement of a Nitrex wire into the collecting system. An [place] set was utilized to dilate the tract and was subsequently exchanged for a Kumpe catheter over a Bentson wire. The Kumpe catheter was advanced down the ureter and into the urinary bladder. Postprocedural spot radiographs were obtained in various obliquities and the catheter was sutured to the skin. The catheter was capped and a dressing was placed. The patient tolerated the procedure well without immediate post procedural complication. FINDINGS: Pre procedural spot radiographic images demonstrates similar appearance of known clustered stones within the left renal pelvis and inferior calices. Note, the renal stones were found to be significantly radiolucent with fluoroscopic imaging. Utilizing a combination of fluoroscopic and ultrasound guidance, access to the left renal collecting system was achieved allowing placement of a 5 French catheter past the calyceal and pelvic stones to the level of the urinary bladder. IMPRESSION: Successful ultrasound and fluoroscopic guided placement of a left sided 5 [place] Kumpe catheter to the level of the urinary bladder via a stone containing posteroinferior calyx to be utilized during impending nephrolithotomy procedure. Note, the patient's stones were found to be significantly radiolucent  with fluoroscopic imaging. Electronically Signed   By: Sandi Mariscal M.D.   On: 08/03/2021 10:36  ? ? ?Brief H and P: ?For complete details please refer to admission H and P, but in brief patient admitted for percutaneous management of large left renal calculi ? ?Hospital Course:  ?Following placement of percutaneous access by interventional radiology, she underwent percutaneous nephrolithotomy.  This was uncomplicated.  Recovery was without bleeding issues, her nephrostomy tube was removed on postop day 1.   at that time she was discharged.   ?Principal Problem: ?  Renal calculus ? ? ?Day of Discharge ?BP 115/62 (BP Location: Left Arm)   Pulse 78   Temp 97.8 ?F (36.6 ?C) (Oral)   Resp 18   Ht $R'5\' 5"'xl$  (1.651 m)   Wt 76.5 kg   SpO2 98%   BMI 28.07 kg/m?  ? ?No results found for this or any previous visit (from the past 24 hour(s)). ? ?Physical Exam: ?General: Alert and awake oriented x3 not in any acute distress. ?HEENT: anicteric sclera, pupils reactive to light and accommodation ?CVS: S1-S2 clear no murmur rubs or gallops ?Chest: clear to auscultation bilaterally, no wheezing rales or rhonchi ?Abdomen: soft nontender, nondistended, normal bowel sounds, no organomegaly ?Extremities: no cyanosis, clubbing or edema noted bilaterally ?Neuro: Cranial nerves II-XII intact, no focal neurological deficits ? ?Disposition: Home ? ?Diet: No restrictions ? ?Activity: Discussed with patient ? ? ? ?  ? ?DISCHARGE FOLLOW-UP ? ? Follow-up Information   ? ? [name], MD Follow up.   ?Specialty: Urology ?Why: 3.21.2023 @  1 pm ?Contact information: ?[address] ?[address] ?[place] [address] ?[phone] ? ? ?  ?  ? ?  ?  ? ?  ? ? ?Time spent on Discharge: ? ?10 minutes ? ?Signed: ?Lillette Boxer Maye Parkinson ?08/15/2021, 2:32 PM ? ? ? ? ?

## 2021-08-16 DIAGNOSIS — S338XXA Sprain of other parts of lumbar spine and pelvis, initial encounter: Secondary | ICD-10-CM | POA: Diagnosis not present

## 2021-08-16 DIAGNOSIS — M9903 Segmental and somatic dysfunction of lumbar region: Secondary | ICD-10-CM | POA: Diagnosis not present

## 2021-08-21 DIAGNOSIS — M9903 Segmental and somatic dysfunction of lumbar region: Secondary | ICD-10-CM | POA: Diagnosis not present

## 2021-08-21 DIAGNOSIS — S338XXA Sprain of other parts of lumbar spine and pelvis, initial encounter: Secondary | ICD-10-CM | POA: Diagnosis not present

## 2021-08-21 NOTE — Progress Notes (Signed)
History of Present Illness: ? ?3.5.2020: Underwent left PCNL--tubeless w/ J2 stent placed, discharged on POD 1.  ?  ?5.19.2020: A recent renal ultrasound revealed normal-appearing bladder, no hydronephrosis or mass in either kidney. 2-3 small calculi left lower pole, 2 right renal calculi, all 6 mm in diameter or smaller. She did have a serum calcium of 10.8 in January of this year. She did say that she had a parathyroid hormone test done.  ?  ?04/28/2019: She had a parthyroidectomy with Dr. Harlow Asa recently. No flank pain currently. ?  ?08.31.2021:  24-hour urine performed in June 2021 revealed low urinary citrate and exceedingly low volume.  It was recommended that she drink more water, add lemon or lime juice to this. ?  ?12.16.2021: KUB--Left mid to inferior pole calcific densities. No calcific densities ?overlying the right renal shadow or expected course of the ureters. ?Pelvic phleboliths. Nonobstructive bowel gas pattern. Multilevel ?Spondylosis. ?  ?6.7.2022: She denies recent gross hematuria, dysuria, flank pain.  She has been trying to drink more fluids. KUB--At least 2 faintly calcified stones overlying the lower left kidney, ?larger measures 14 mm and the smaller measures 7 mm. ?  ?7.22.2022: CT--1. Bulky branching calculus within the LEFT renal pelvis and ?extending into the lower pole calices. ?2. No ureterolithiasis or obstructive uropathy. ? ?3.2.2023: Underwent Lt PCNL/stent placement.  ? ?3.21.2023: She returns for postoperative follow-up.  She is having some stent discomfort.  No gross hematuria, fever, abdominal pain. ? ?Past Medical History:  ?Diagnosis Date  ? Anxiety   ? Diabetes mellitus without complication ([place])   ? TYPE 2  ? Fibromyalgia   ? History of kidney stones   ? Hypertension   ? Left renal stone   ? Neuromuscular disorder ([place])   ? restless leg syndrome  ? Neuropathy   ? FEET  ? ? ?Past Surgical History:  ?Procedure Laterality Date  ? ABDOMINAL HYSTERECTOMY  2000  ? COMPLETE  ?  CYSTOSCOPY N/A 08/27/2013  ? Procedure: CYSTOSCOPY; REMOVAL OF BLADDER CALCULUS;  Surgeon: [name], MD;  Location: [facility];  Service: Urology;  Laterality: N/A;  ? CYSTOSCOPY W/ URETERAL STENT PLACEMENT N/A 2012  ? CYSTOSCOPY W/ URETERAL STENT REMOVAL N/A 2013  ? IR URETERAL STENT LEFT NEW ACCESS W/O SEP NEPHROSTOMY CATH  08/07/2018  ? IR URETERAL STENT LEFT NEW ACCESS W/O SEP NEPHROSTOMY CATH  08/03/2021  ? LITHOTRIPSY  2012  ? NEPHROLITHOTOMY Left 08/07/2018  ? Procedure: NEPHROLITHOTOMY PERCUTANEOUS;  Surgeon: [name], MD;  Location: [facility];  Service: Urology;  Laterality: Left;  ? NEPHROLITHOTOMY Left 08/03/2021  ? Procedure: NEPHROLITHOTOMY PERCUTANEOUS;  Surgeon: [name], MD;  Location: [facility];  Service: Urology;  Laterality: Left;  ? [place]  ? PARATHYROIDECTOMY Right 02/27/2019  ? Procedure: RIGHT INFERIOR PARATHYROIDECTOMY;  Surgeon: [name], MD;  Location: [facility];  Service: General;  Laterality: Right;  ? ? ?Home Medications:  ?(Not in a hospital admission) ? ? ?Allergies:  ?Allergies  ?Allergen Reactions  ? Sulfa Antibiotics Itching and Rash  ? ? ?No family history on file. ? ?Social History:  reports that she has never smoked. She has never used smokeless tobacco. She reports that she does not drink alcohol and does not use drugs. ? ?ROS: ?A complete review of systems was performed.  All systems are negative except for pertinent findings as noted. ? ?Physical Exam:  ?Vital signs in last 24 hours: ?'@VSRANGES'$ @ ?General:  Alert and oriented, No acute distress ?HEENT: Normocephalic,  atraumatic ?Neck: No JVD or lymphadenopathy ?Cardiovascular: Regular rate  ?Lungs: Normal inspiratory/expiratory excursion ?Back: Flank incision well-healed.  Sutures were removed. ?Extremities: No edema ?Neurologic: Grossly intact ? ?I have reviewed prior pt notes ? ?I have reviewed urinalysis results ? ?I have independently reviewed prior imaging ? ?Cystoscopy Procedure  Note: ? ?Indication:  ? ?After informed consent and discussion of the procedure and its risks, Allison Oneill was positioned and prepped in the standard fashion.  ?Cystoscopy was performed with a flexible cystoscope.   ?Findings: ?Urethra:nml ?Ureteral orifices: Stent present at left ureteral orifice ?Bladder: Normal ? ?Left double-J stent was removed without difficulty ? ?The patient tolerated the procedure well. ? ?  ? ?Impression/Assessment:  ?Status post recent left percutaneous nephrolithotomy, doing well. ? ?Plan:  ?1.  Urine was cultured, she was covered with Cipro today ? ?2.  I will see back in 3 months with KUB ? ?Lillette Boxer Soo Steelman ?08/21/2021, 9:26 AM  ?Lillette Boxer. Esbeydi Manago MD ? ? ?

## 2021-08-22 ENCOUNTER — Other Ambulatory Visit: Payer: Self-pay

## 2021-08-22 ENCOUNTER — Ambulatory Visit (INDEPENDENT_AMBULATORY_CARE_PROVIDER_SITE_OTHER): Payer: Medicare Other | Admitting: Urology

## 2021-08-22 VITALS — BP 132/71 | HR 71 | Ht 65.0 in | Wt 165.0 lb

## 2021-08-22 DIAGNOSIS — N200 Calculus of kidney: Secondary | ICD-10-CM | POA: Diagnosis not present

## 2021-08-22 LAB — URINALYSIS, ROUTINE W REFLEX MICROSCOPIC
Bilirubin, UA: NEGATIVE
Glucose, UA: NEGATIVE
Nitrite, UA: NEGATIVE
Specific Gravity, UA: >1.03 — ABNORMAL HIGH (ref 1.005–1.030)
Urobilinogen, Ur: 0.2 mg/dL (ref 0.2–1.0)
pH, UA: 5.5 (ref 5.0–7.5)

## 2021-08-22 LAB — MICROSCOPIC EXAMINATION
RBC, Urine: >30 /hpf — AB (ref 0–2)
Renal Epithel, UA: NONE SEEN /hpf

## 2021-08-22 MED ORDER — CIPROFLOXACIN HCL 500 MG PO TABS
500.0000 mg | ORAL_TABLET | Freq: Once | ORAL | Status: AC
  Administered 2021-08-22: 500 mg via ORAL

## 2021-08-22 NOTE — Addendum Note (Signed)
Addended by: Dorisann Frames on: 08/22/2021 01:24 PM ? ? Modules accepted: Orders ? ?

## 2021-08-24 LAB — URINE CULTURE

## 2021-08-29 ENCOUNTER — Other Ambulatory Visit: Payer: Self-pay | Admitting: Urology

## 2021-08-29 ENCOUNTER — Telehealth: Payer: Self-pay

## 2021-08-29 DIAGNOSIS — N3000 Acute cystitis without hematuria: Secondary | ICD-10-CM

## 2021-08-29 MED ORDER — NITROFURANTOIN MONOHYD MACRO 100 MG PO CAPS: 100.0000 mg | ORAL_CAPSULE | Freq: Two times a day (BID) | ORAL | 0 refills | Status: AC

## 2021-08-29 NOTE — Telephone Encounter (Signed)
-----   Message from [name], MD sent at 08/29/2021  8:43 AM EDT ----- ?Let patient know that urine culture was positive, antibiotic was sent in ?----- Message ----- ?From: Audie Box, CMA ?Sent: 08/25/2021  12:35 PM EDT ?To: [name], MD, # ? ?No treatment started, please review. ? ?

## 2021-08-29 NOTE — Telephone Encounter (Signed)
Patient called and made aware.

## 2021-09-14 DIAGNOSIS — I10 Essential (primary) hypertension: Secondary | ICD-10-CM | POA: Diagnosis not present

## 2021-09-14 DIAGNOSIS — M25511 Pain in right shoulder: Secondary | ICD-10-CM | POA: Diagnosis not present

## 2021-09-28 DIAGNOSIS — I739 Peripheral vascular disease, unspecified: Secondary | ICD-10-CM | POA: Diagnosis not present

## 2021-09-28 DIAGNOSIS — L609 Nail disorder, unspecified: Secondary | ICD-10-CM | POA: Diagnosis not present

## 2021-09-28 DIAGNOSIS — L110 Acquired keratosis follicularis: Secondary | ICD-10-CM | POA: Diagnosis not present

## 2021-10-06 IMAGING — DX DG ABDOMEN 1V
2 series · 2 of 2 positions shown · non-contrast
Comparison: 01/25/2020 and prior.

CLINICAL DATA: urolithiasis

EXAM:
ABDOMEN - 1 VIEW

[abdomen kub (1 of 2)]
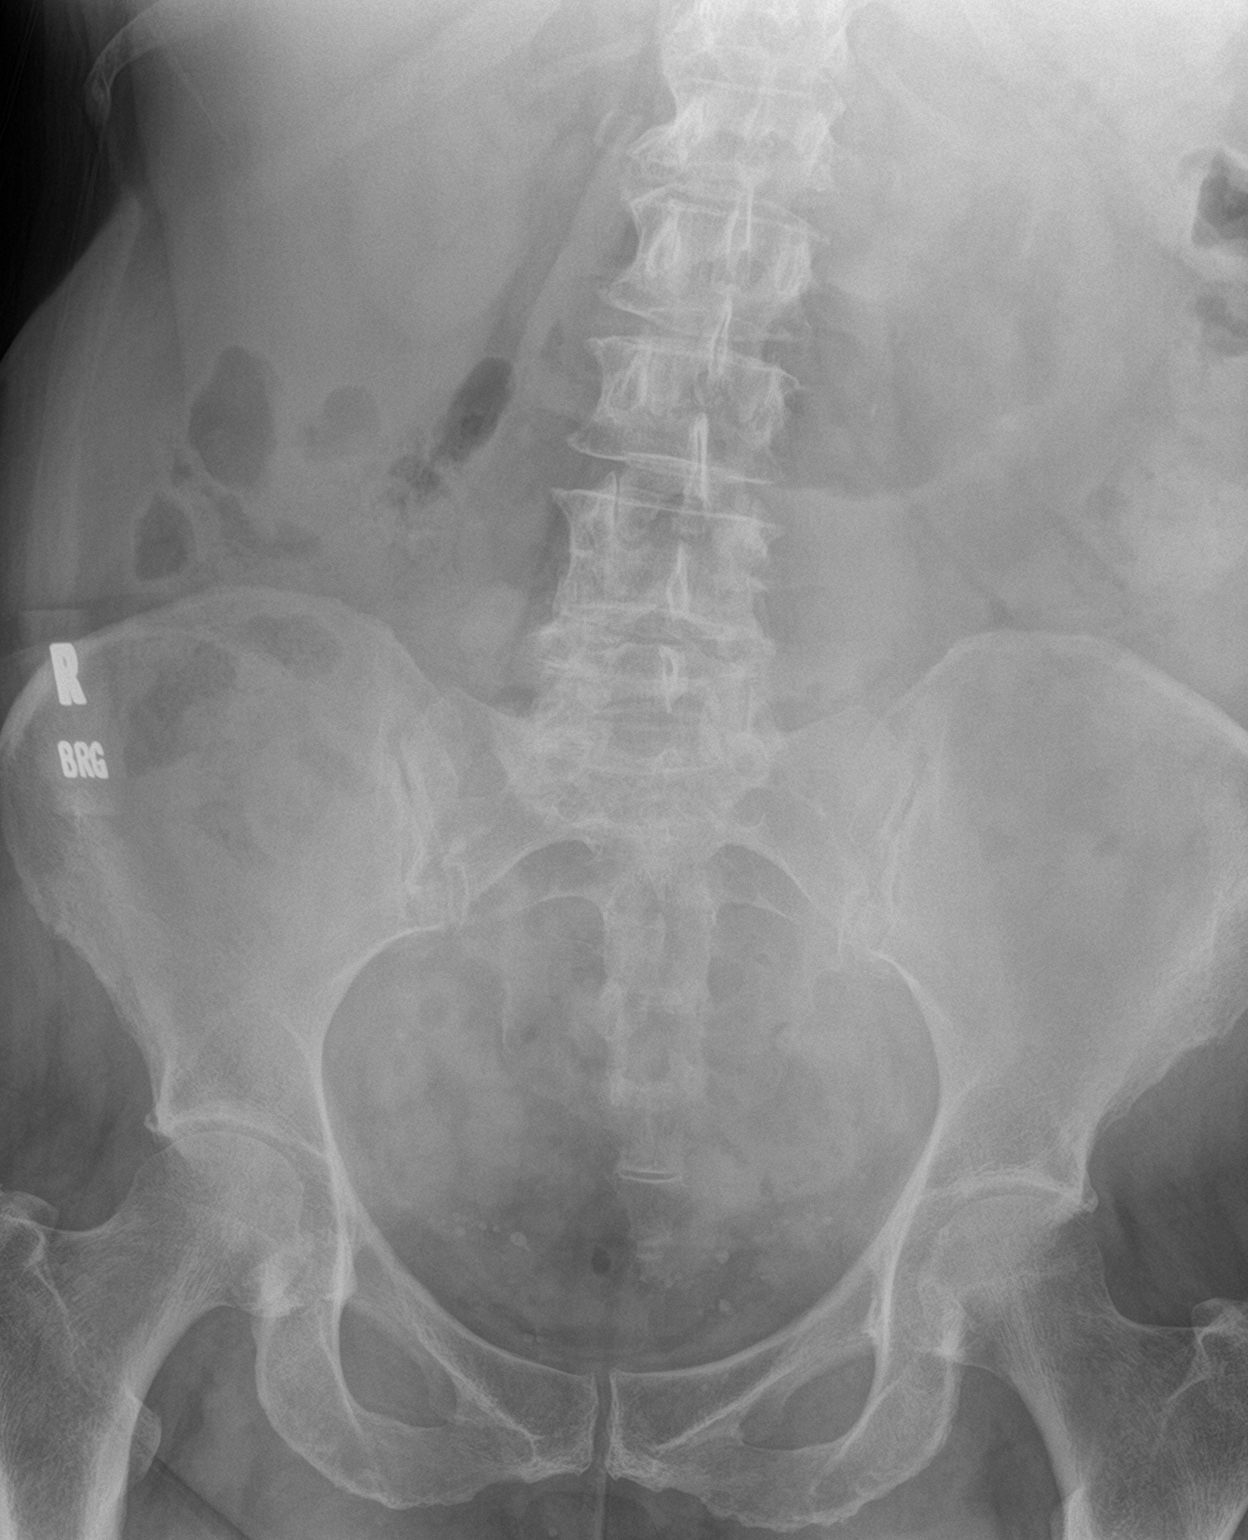

[abdomen kub (2 of 2)]
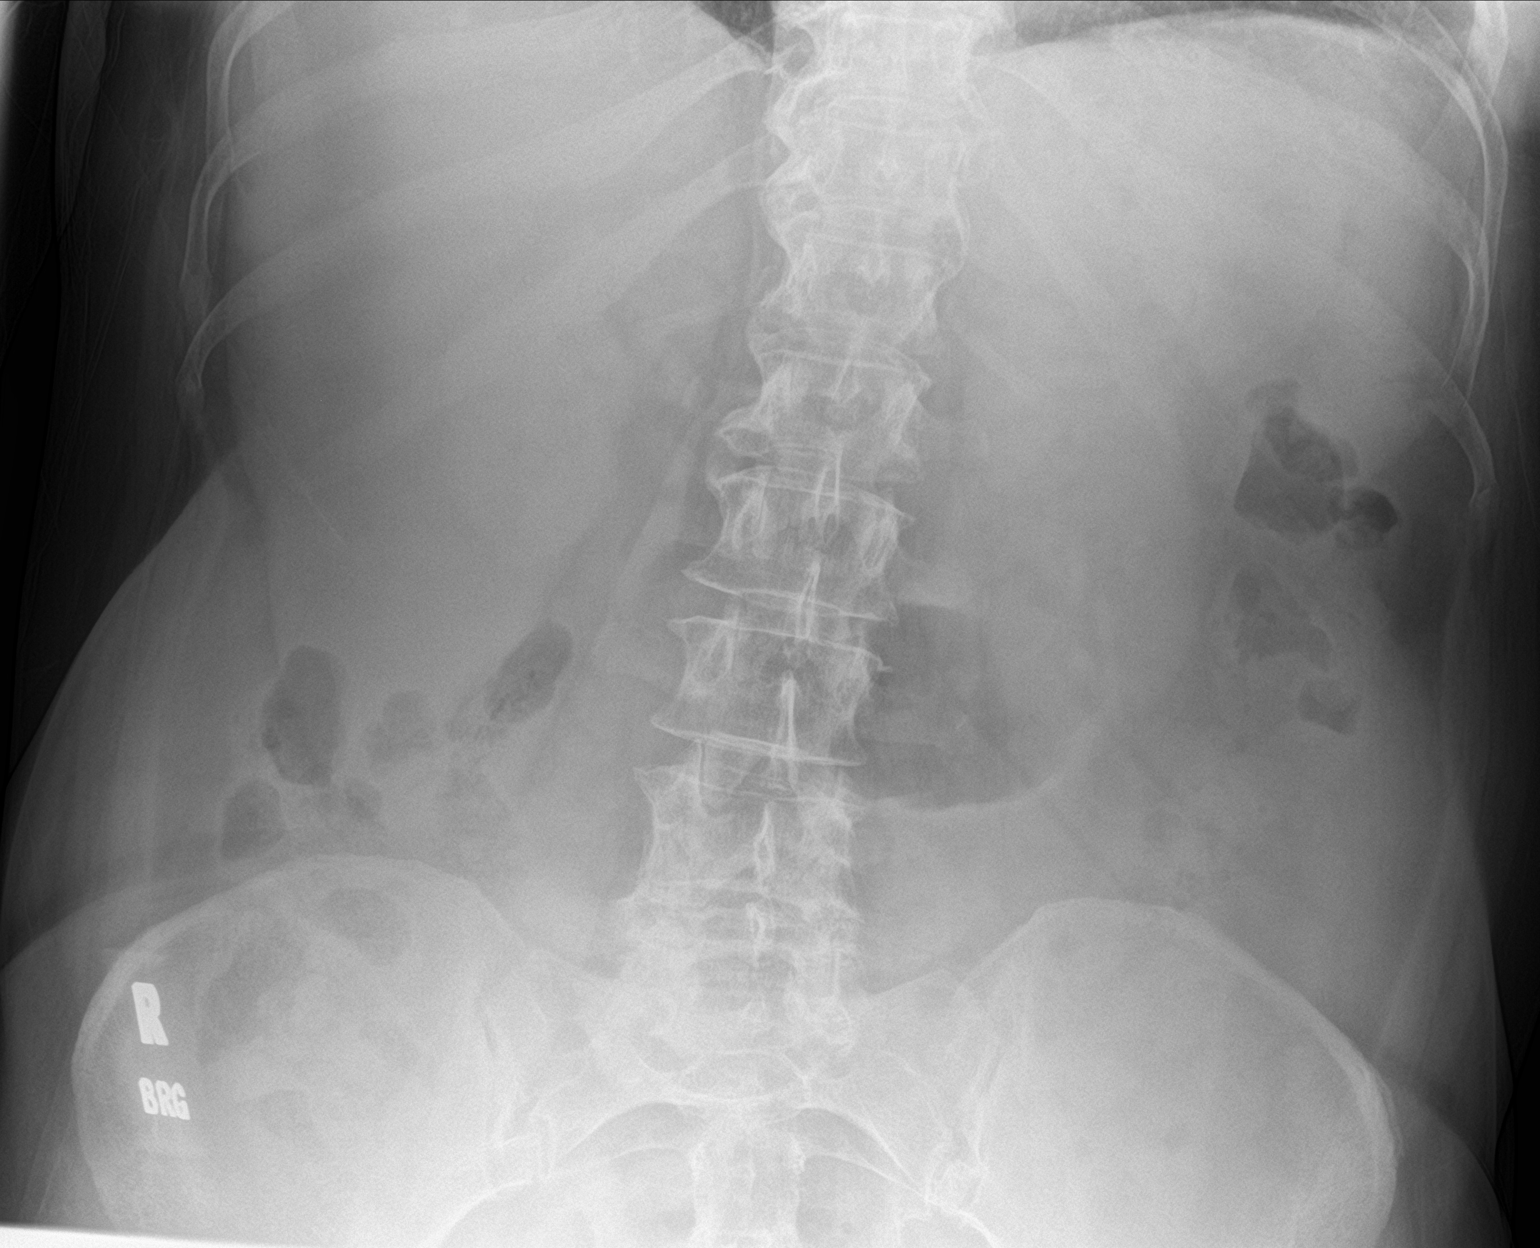

[2 of 2 positions shown; findings below may reference images not displayed]

FINDINGS: Left mid to inferior pole calcific densities. No calcific densities
overlying the right renal shadow or expected course of the ureters.
Pelvic phleboliths. Nonobstructive bowel gas pattern. Multilevel
spondylosis.
IMPRESSION: Left nephrolithiasis.

## 2021-11-04 DIAGNOSIS — Z1231 Encounter for screening mammogram for malignant neoplasm of breast: Secondary | ICD-10-CM | POA: Diagnosis not present

## 2021-11-06 DIAGNOSIS — N1831 Chronic kidney disease, stage 3a: Secondary | ICD-10-CM | POA: Diagnosis not present

## 2021-11-06 DIAGNOSIS — E1165 Type 2 diabetes mellitus with hyperglycemia: Secondary | ICD-10-CM | POA: Diagnosis not present

## 2021-11-06 DIAGNOSIS — E1140 Type 2 diabetes mellitus with diabetic neuropathy, unspecified: Secondary | ICD-10-CM | POA: Diagnosis not present

## 2021-11-09 DIAGNOSIS — M67911 Unspecified disorder of synovium and tendon, right shoulder: Secondary | ICD-10-CM | POA: Diagnosis not present

## 2021-11-09 DIAGNOSIS — G619 Inflammatory polyneuropathy, unspecified: Secondary | ICD-10-CM | POA: Diagnosis not present

## 2021-11-09 DIAGNOSIS — Z23 Encounter for immunization: Secondary | ICD-10-CM | POA: Diagnosis not present

## 2021-11-09 DIAGNOSIS — E1140 Type 2 diabetes mellitus with diabetic neuropathy, unspecified: Secondary | ICD-10-CM | POA: Diagnosis not present

## 2021-11-09 DIAGNOSIS — N1831 Chronic kidney disease, stage 3a: Secondary | ICD-10-CM | POA: Diagnosis not present

## 2021-11-09 DIAGNOSIS — E7849 Other hyperlipidemia: Secondary | ICD-10-CM | POA: Diagnosis not present

## 2021-11-09 DIAGNOSIS — M25611 Stiffness of right shoulder, not elsewhere classified: Secondary | ICD-10-CM | POA: Diagnosis not present

## 2021-11-09 DIAGNOSIS — I10 Essential (primary) hypertension: Secondary | ICD-10-CM | POA: Diagnosis not present

## 2021-11-09 DIAGNOSIS — E1122 Type 2 diabetes mellitus with diabetic chronic kidney disease: Secondary | ICD-10-CM | POA: Diagnosis not present

## 2021-11-09 DIAGNOSIS — Z0001 Encounter for general adult medical examination with abnormal findings: Secondary | ICD-10-CM | POA: Diagnosis not present

## 2021-11-27 DIAGNOSIS — Z87442 Personal history of urinary calculi: Secondary | ICD-10-CM | POA: Diagnosis not present

## 2021-11-27 DIAGNOSIS — Z79899 Other long term (current) drug therapy: Secondary | ICD-10-CM | POA: Diagnosis not present

## 2021-11-27 DIAGNOSIS — E119 Type 2 diabetes mellitus without complications: Secondary | ICD-10-CM | POA: Diagnosis not present

## 2021-11-27 DIAGNOSIS — N132 Hydronephrosis with renal and ureteral calculous obstruction: Secondary | ICD-10-CM | POA: Diagnosis not present

## 2021-11-27 DIAGNOSIS — N23 Unspecified renal colic: Secondary | ICD-10-CM | POA: Diagnosis not present

## 2021-11-27 DIAGNOSIS — N1330 Unspecified hydronephrosis: Secondary | ICD-10-CM | POA: Diagnosis not present

## 2021-11-27 DIAGNOSIS — R109 Unspecified abdominal pain: Secondary | ICD-10-CM | POA: Diagnosis not present

## 2021-11-27 DIAGNOSIS — N134 Hydroureter: Secondary | ICD-10-CM | POA: Diagnosis not present

## 2021-11-27 DIAGNOSIS — I10 Essential (primary) hypertension: Secondary | ICD-10-CM | POA: Diagnosis not present

## 2021-11-27 DIAGNOSIS — E785 Hyperlipidemia, unspecified: Secondary | ICD-10-CM | POA: Diagnosis not present

## 2021-11-27 DIAGNOSIS — Z882 Allergy status to sulfonamides status: Secondary | ICD-10-CM | POA: Diagnosis not present

## 2021-11-27 DIAGNOSIS — K5730 Diverticulosis of large intestine without perforation or abscess without bleeding: Secondary | ICD-10-CM | POA: Diagnosis not present

## 2021-11-27 DIAGNOSIS — N390 Urinary tract infection, site not specified: Secondary | ICD-10-CM | POA: Diagnosis not present

## 2021-11-27 DIAGNOSIS — Z7984 Long term (current) use of oral hypoglycemic drugs: Secondary | ICD-10-CM | POA: Diagnosis not present

## 2021-11-27 NOTE — Progress Notes (Signed)
History of Present Illness: Allison Oneill is a 71 y.o. year old female here for f/u of urolithiasis.  3.5.2020: Underwent left PCNL--tubeless w/ J2 stent placed, discharged on POD 1.    5.19.2020: A recent renal ultrasound revealed normal-appearing bladder, no hydronephrosis or mass in either kidney. 2-3 small calculi left lower pole, 2 right renal calculi, all 6 mm in diameter or smaller. She did have a serum calcium of 10.8 in January of this year. She did say that she had a parathyroid hormone test done.    04/28/2019: She had a parthyroidectomy with Dr. Gerrit Friends recently. No flank pain currently.   08.31.2021:  24-hour urine performed in June 2021 revealed low urinary citrate and exceedingly low volume.  It was recommended that she drink more water, add lemon or lime juice to this.   12.16.2021: KUB--Left mid to inferior pole calcific densities. No calcific densities overlying the right renal shadow or expected course of the ureters. Pelvic phleboliths. Nonobstructive bowel gas pattern. Multilevel Spondylosis.   6.7.2022: She denies recent gross hematuria, dysuria, flank pain.  She has been trying to drink more fluids. KUB--At least 2 faintly calcified stones overlying the lower left kidney, larger measures 14 mm and the smaller measures 7 mm.   7.22.2022: CT--1. Bulky branching calculus within the LEFT renal pelvis and extending into the lower pole calices. 2. No ureterolithiasis or obstructive uropathy.   3.2.2023: Underwent Lt PCNL/stent placement.    3.21.2023: Stent removed.  6.27.2023: Presented to the emergency room in [place] last night with left flank pain.  No fever or chills.  CT revealed a 5 x 7 mm left distal ureteral stone.  Currently she is comfortable.  CT reading as follows:  1. Mild to moderate left hydronephrosis and hydroureter, secondary to a 5 x 7 mm stone in the distal left ureter about 2 cm proximal to the left UVJ. 2. Additional stones in the left kidney 3.  Mild diverticular disease of the left colon without acute inflammatory changes.     Past Medical History:  Diagnosis Date   Anxiety    Diabetes mellitus without complication ([facility])    TYPE 2   Fibromyalgia    History of kidney stones    Hypertension    Left renal stone    Neuromuscular disorder ([facility])    restless leg syndrome   Neuropathy    FEET    Past Surgical History:  Procedure Laterality Date   ABDOMINAL HYSTERECTOMY  2000   COMPLETE   CYSTOSCOPY N/A 08/27/2013   Procedure: CYSTOSCOPY; REMOVAL OF BLADDER CALCULUS;  Surgeon: Ky Barban, MD;  Location: [facility];  Service: Urology;  Laterality: N/A;   CYSTOSCOPY W/ URETERAL STENT PLACEMENT N/A 2012   CYSTOSCOPY W/ URETERAL STENT REMOVAL N/A 2013   IR URETERAL STENT LEFT NEW ACCESS W/O SEP NEPHROSTOMY CATH  08/07/2018   IR URETERAL STENT LEFT NEW ACCESS W/O SEP NEPHROSTOMY CATH  08/03/2021   LITHOTRIPSY  2012   NEPHROLITHOTOMY Left 08/07/2018   Procedure: NEPHROLITHOTOMY PERCUTANEOUS;  Surgeon: Marcine Matar, MD;  Location[facility];  Service: Urology;  Laterality: Left;   NEPHROLITHOTOMY Left 08/03/2021   Procedure: NEPHROLITHOTOMY PERCUTANEOUS;  Surgeon: Marcine Matar, MD;  Location[facility];  Service: Urology;  Laterality: Left;   NEUROMA SURGERY Left 1997   PARATHYROIDECTOMY Right 02/27/2019   Procedure: RIGHT INFERIOR PARATHYROIDECTOMY;  Surgeon: Darnell Level, MD;  Location[facility];  Service: General;  Laterality: Right;    Home Medications:  (Not in a hospital  admission)   Allergies:  Allergies  Allergen Reactions   Sulfa Antibiotics Itching and Rash    No family history on file.  Social History:  reports that she has never smoked. She has never used smokeless tobacco. She reports that she does not drink alcohol and does not use drugs.  ROS: A complete review of systems was performed.  All systems are negative except for pertinent findings as noted.  Physical Exam:  Vital signs in last 24  hours: @VSRANGES @ General:  Alert and oriented, No acute distress HEENT: Normocephalic, atraumatic Neck: No JVD or lymphadenopathy Cardiovascular: Regular rate  Lungs: Normal inspiratory/expiratory excursion Abdomen: Soft, nontender, nondistended, no abdominal masses Back: No CVA tenderness Extremities: No edema Neurologic: Grossly intact  I have reviewed prior pt notes  I have reviewed notes from referring/previous physicians-notes from emergency room  I have reviewed urinalysis results-clear  I have independently reviewed prior imaging-CT images from last night    Impression/Assessment:  History of large stones in left kidney, status post nephrolithotomy earlier this year.  She has a couple of residual fragments in the left kidney and a left distal ureteral stone making her present recently  Plan:  1.  I added alfuzosin to her medical regimen.  She will continue cephalexin given to her at the emergency room as well as as needed ondansetron and Percocet  2.  I will have her come back in 1 week for recheck, she knows to come back sooner if significant issues.  Bertram Millard Ophie Burrowes 11/27/2021, 8:58 PM  Bertram Millard. Chelle Cayton MD

## 2021-11-28 ENCOUNTER — Ambulatory Visit (INDEPENDENT_AMBULATORY_CARE_PROVIDER_SITE_OTHER): Payer: Medicare Other | Admitting: Urology

## 2021-11-28 ENCOUNTER — Ambulatory Visit (HOSPITAL_COMMUNITY)
Admission: RE | Admit: 2021-11-28 | Discharge: 2021-11-28 | Disposition: A | Discharge status: Home / Self Care | Payer: Medicare Other | Source: Ambulatory Visit | Attending: Urology | Admitting: Urology

## 2021-11-28 VITALS — BP 105/51 | HR 72

## 2021-11-28 DIAGNOSIS — M47816 Spondylosis without myelopathy or radiculopathy, lumbar region: Secondary | ICD-10-CM | POA: Diagnosis not present

## 2021-11-28 DIAGNOSIS — M4186 Other forms of scoliosis, lumbar region: Secondary | ICD-10-CM | POA: Diagnosis not present

## 2021-11-28 DIAGNOSIS — N202 Calculus of kidney with calculus of ureter: Secondary | ICD-10-CM

## 2021-11-28 DIAGNOSIS — N200 Calculus of kidney: Secondary | ICD-10-CM | POA: Diagnosis not present

## 2021-11-28 DIAGNOSIS — M160 Bilateral primary osteoarthritis of hip: Secondary | ICD-10-CM | POA: Diagnosis not present

## 2021-11-28 DIAGNOSIS — E210 Primary hyperparathyroidism: Secondary | ICD-10-CM | POA: Diagnosis not present

## 2021-11-28 LAB — URINALYSIS, ROUTINE W REFLEX MICROSCOPIC
Bilirubin, UA: NEGATIVE
Glucose, UA: NEGATIVE
Ketones, UA: NEGATIVE
Leukocytes,UA: NEGATIVE
Nitrite, UA: NEGATIVE
Specific Gravity, UA: >1.03 — ABNORMAL HIGH (ref 1.005–1.030)
Urobilinogen, Ur: 0.2 mg/dL (ref 0.2–1.0)
pH, UA: 5 (ref 5.0–7.5)

## 2021-11-28 MED ORDER — ALFUZOSIN HCL ER 10 MG PO TB24: 10.0000 mg | ORAL_TABLET | Freq: Every day | ORAL | 0 refills | Status: DC

## 2021-12-01 DIAGNOSIS — M19011 Primary osteoarthritis, right shoulder: Secondary | ICD-10-CM | POA: Diagnosis not present

## 2021-12-01 DIAGNOSIS — M25511 Pain in right shoulder: Secondary | ICD-10-CM | POA: Diagnosis not present

## 2021-12-01 DIAGNOSIS — S43431A Superior glenoid labrum lesion of right shoulder, initial encounter: Secondary | ICD-10-CM | POA: Diagnosis not present

## 2021-12-01 DIAGNOSIS — M75121 Complete rotator cuff tear or rupture of right shoulder, not specified as traumatic: Secondary | ICD-10-CM | POA: Diagnosis not present

## 2021-12-01 DIAGNOSIS — S46011A Strain of muscle(s) and tendon(s) of the rotator cuff of right shoulder, initial encounter: Secondary | ICD-10-CM | POA: Diagnosis not present

## 2021-12-07 ENCOUNTER — Ambulatory Visit: Payer: Medicare Other | Admitting: Physician Assistant

## 2021-12-11 ENCOUNTER — Telehealth: Payer: Self-pay

## 2021-12-11 NOTE — Telephone Encounter (Signed)
Patient called and made aware. Patient states that she passed the stone on 6/28 that's why she cancelled her appointment on 7/6.

## 2021-12-11 NOTE — Telephone Encounter (Signed)
-----   Message from [name], MD sent at 12/07/2021  3:54 PM EDT ----- Let pt know that stone was stillpresent on recent XR--does she have a f/u appt? ----- Message ----- From: Iris Pert, LPN Sent: [id number]   9:25 AM EDT To: [name], MD  Patient seen in office 11/28/21

## 2021-12-26 DIAGNOSIS — I739 Peripheral vascular disease, unspecified: Secondary | ICD-10-CM | POA: Diagnosis not present

## 2021-12-26 DIAGNOSIS — L110 Acquired keratosis follicularis: Secondary | ICD-10-CM | POA: Diagnosis not present

## 2021-12-26 DIAGNOSIS — L609 Nail disorder, unspecified: Secondary | ICD-10-CM | POA: Diagnosis not present

## 2022-01-28 DIAGNOSIS — Z20822 Contact with and (suspected) exposure to covid-19: Secondary | ICD-10-CM | POA: Diagnosis not present

## 2022-02-07 DIAGNOSIS — N1831 Chronic kidney disease, stage 3a: Secondary | ICD-10-CM | POA: Diagnosis not present

## 2022-02-07 DIAGNOSIS — E213 Hyperparathyroidism, unspecified: Secondary | ICD-10-CM | POA: Diagnosis not present

## 2022-02-07 DIAGNOSIS — E7849 Other hyperlipidemia: Secondary | ICD-10-CM | POA: Diagnosis not present

## 2022-02-07 DIAGNOSIS — Z1159 Encounter for screening for other viral diseases: Secondary | ICD-10-CM | POA: Diagnosis not present

## 2022-02-07 DIAGNOSIS — E1140 Type 2 diabetes mellitus with diabetic neuropathy, unspecified: Secondary | ICD-10-CM | POA: Diagnosis not present

## 2022-02-07 DIAGNOSIS — I10 Essential (primary) hypertension: Secondary | ICD-10-CM | POA: Diagnosis not present

## 2022-02-07 DIAGNOSIS — E1165 Type 2 diabetes mellitus with hyperglycemia: Secondary | ICD-10-CM | POA: Diagnosis not present

## 2022-02-12 DIAGNOSIS — Z23 Encounter for immunization: Secondary | ICD-10-CM | POA: Diagnosis not present

## 2022-02-12 DIAGNOSIS — I10 Essential (primary) hypertension: Secondary | ICD-10-CM | POA: Diagnosis not present

## 2022-02-12 DIAGNOSIS — G619 Inflammatory polyneuropathy, unspecified: Secondary | ICD-10-CM | POA: Diagnosis not present

## 2022-02-12 DIAGNOSIS — E1140 Type 2 diabetes mellitus with diabetic neuropathy, unspecified: Secondary | ICD-10-CM | POA: Diagnosis not present

## 2022-02-12 DIAGNOSIS — E7849 Other hyperlipidemia: Secondary | ICD-10-CM | POA: Diagnosis not present

## 2022-02-12 DIAGNOSIS — M75101 Unspecified rotator cuff tear or rupture of right shoulder, not specified as traumatic: Secondary | ICD-10-CM | POA: Diagnosis not present

## 2022-02-12 DIAGNOSIS — E1122 Type 2 diabetes mellitus with diabetic chronic kidney disease: Secondary | ICD-10-CM | POA: Diagnosis not present

## 2022-02-12 DIAGNOSIS — N1831 Chronic kidney disease, stage 3a: Secondary | ICD-10-CM | POA: Diagnosis not present

## 2022-02-16 DIAGNOSIS — H35361 Drusen (degenerative) of macula, right eye: Secondary | ICD-10-CM | POA: Diagnosis not present

## 2022-02-16 DIAGNOSIS — H524 Presbyopia: Secondary | ICD-10-CM | POA: Diagnosis not present

## 2022-03-26 DIAGNOSIS — I739 Peripheral vascular disease, unspecified: Secondary | ICD-10-CM | POA: Diagnosis not present

## 2022-03-26 DIAGNOSIS — L110 Acquired keratosis follicularis: Secondary | ICD-10-CM | POA: Diagnosis not present

## 2022-03-26 DIAGNOSIS — L609 Nail disorder, unspecified: Secondary | ICD-10-CM | POA: Diagnosis not present

## 2022-06-28 DIAGNOSIS — M79671 Pain in right foot: Secondary | ICD-10-CM | POA: Diagnosis not present

## 2022-06-28 DIAGNOSIS — I739 Peripheral vascular disease, unspecified: Secondary | ICD-10-CM | POA: Diagnosis not present

## 2022-06-28 DIAGNOSIS — L110 Acquired keratosis follicularis: Secondary | ICD-10-CM | POA: Diagnosis not present

## 2022-06-28 DIAGNOSIS — M79674 Pain in right toe(s): Secondary | ICD-10-CM | POA: Diagnosis not present

## 2022-06-28 DIAGNOSIS — M79672 Pain in left foot: Secondary | ICD-10-CM | POA: Diagnosis not present

## 2022-06-28 DIAGNOSIS — M79675 Pain in left toe(s): Secondary | ICD-10-CM | POA: Diagnosis not present

## 2022-06-28 DIAGNOSIS — E1140 Type 2 diabetes mellitus with diabetic neuropathy, unspecified: Secondary | ICD-10-CM | POA: Diagnosis not present

## 2022-06-29 DIAGNOSIS — M5432 Sciatica, left side: Secondary | ICD-10-CM | POA: Diagnosis not present

## 2022-06-29 DIAGNOSIS — M4316 Spondylolisthesis, lumbar region: Secondary | ICD-10-CM | POA: Diagnosis not present

## 2022-07-01 DIAGNOSIS — N189 Chronic kidney disease, unspecified: Secondary | ICD-10-CM | POA: Diagnosis not present

## 2022-07-01 DIAGNOSIS — E119 Type 2 diabetes mellitus without complications: Secondary | ICD-10-CM | POA: Diagnosis not present

## 2022-07-01 DIAGNOSIS — F32A Depression, unspecified: Secondary | ICD-10-CM | POA: Diagnosis not present

## 2022-07-01 DIAGNOSIS — M5432 Sciatica, left side: Secondary | ICD-10-CM | POA: Diagnosis not present

## 2022-07-01 DIAGNOSIS — Z882 Allergy status to sulfonamides status: Secondary | ICD-10-CM | POA: Diagnosis not present

## 2022-07-01 DIAGNOSIS — E1122 Type 2 diabetes mellitus with diabetic chronic kidney disease: Secondary | ICD-10-CM | POA: Diagnosis not present

## 2022-07-01 DIAGNOSIS — E785 Hyperlipidemia, unspecified: Secondary | ICD-10-CM | POA: Diagnosis not present

## 2022-07-01 DIAGNOSIS — M79605 Pain in left leg: Secondary | ICD-10-CM | POA: Diagnosis not present

## 2022-07-01 DIAGNOSIS — Z79899 Other long term (current) drug therapy: Secondary | ICD-10-CM | POA: Diagnosis not present

## 2022-07-01 DIAGNOSIS — I129 Hypertensive chronic kidney disease with stage 1 through stage 4 chronic kidney disease, or unspecified chronic kidney disease: Secondary | ICD-10-CM | POA: Diagnosis not present

## 2022-07-01 DIAGNOSIS — Z7984 Long term (current) use of oral hypoglycemic drugs: Secondary | ICD-10-CM | POA: Diagnosis not present

## 2022-07-01 DIAGNOSIS — M545 Low back pain, unspecified: Secondary | ICD-10-CM | POA: Diagnosis not present

## 2022-07-01 DIAGNOSIS — M25552 Pain in left hip: Secondary | ICD-10-CM | POA: Diagnosis not present

## 2022-07-01 DIAGNOSIS — I10 Essential (primary) hypertension: Secondary | ICD-10-CM | POA: Diagnosis not present

## 2022-07-02 DIAGNOSIS — Z7984 Long term (current) use of oral hypoglycemic drugs: Secondary | ICD-10-CM | POA: Diagnosis not present

## 2022-07-02 DIAGNOSIS — M5432 Sciatica, left side: Secondary | ICD-10-CM | POA: Diagnosis not present

## 2022-07-02 DIAGNOSIS — R6889 Other general symptoms and signs: Secondary | ICD-10-CM | POA: Diagnosis not present

## 2022-07-02 DIAGNOSIS — Z743 Need for continuous supervision: Secondary | ICD-10-CM | POA: Diagnosis not present

## 2022-07-02 DIAGNOSIS — M79605 Pain in left leg: Secondary | ICD-10-CM | POA: Diagnosis not present

## 2022-07-02 DIAGNOSIS — E785 Hyperlipidemia, unspecified: Secondary | ICD-10-CM | POA: Diagnosis not present

## 2022-07-02 DIAGNOSIS — E119 Type 2 diabetes mellitus without complications: Secondary | ICD-10-CM | POA: Diagnosis not present

## 2022-07-02 DIAGNOSIS — M5442 Lumbago with sciatica, left side: Secondary | ICD-10-CM | POA: Diagnosis not present

## 2022-07-02 DIAGNOSIS — Z79899 Other long term (current) drug therapy: Secondary | ICD-10-CM | POA: Diagnosis not present

## 2022-07-04 DIAGNOSIS — M5430 Sciatica, unspecified side: Secondary | ICD-10-CM | POA: Diagnosis not present

## 2022-07-05 DIAGNOSIS — I6783 Posterior reversible encephalopathy syndrome: Secondary | ICD-10-CM | POA: Diagnosis not present

## 2022-07-05 DIAGNOSIS — Z7984 Long term (current) use of oral hypoglycemic drugs: Secondary | ICD-10-CM | POA: Diagnosis not present

## 2022-07-05 DIAGNOSIS — N189 Chronic kidney disease, unspecified: Secondary | ICD-10-CM | POA: Diagnosis not present

## 2022-07-05 DIAGNOSIS — Z882 Allergy status to sulfonamides status: Secondary | ICD-10-CM | POA: Diagnosis not present

## 2022-07-05 DIAGNOSIS — M5116 Intervertebral disc disorders with radiculopathy, lumbar region: Secondary | ICD-10-CM | POA: Diagnosis not present

## 2022-07-05 DIAGNOSIS — M47816 Spondylosis without myelopathy or radiculopathy, lumbar region: Secondary | ICD-10-CM | POA: Diagnosis not present

## 2022-07-05 DIAGNOSIS — Z79899 Other long term (current) drug therapy: Secondary | ICD-10-CM | POA: Diagnosis not present

## 2022-07-05 DIAGNOSIS — E1122 Type 2 diabetes mellitus with diabetic chronic kidney disease: Secondary | ICD-10-CM | POA: Diagnosis not present

## 2022-07-05 DIAGNOSIS — M5126 Other intervertebral disc displacement, lumbar region: Secondary | ICD-10-CM | POA: Diagnosis not present

## 2022-07-05 DIAGNOSIS — R0902 Hypoxemia: Secondary | ICD-10-CM | POA: Diagnosis not present

## 2022-07-05 DIAGNOSIS — T50901A Poisoning by unspecified drugs, medicaments and biological substances, accidental (unintentional), initial encounter: Secondary | ICD-10-CM | POA: Diagnosis not present

## 2022-07-05 DIAGNOSIS — R404 Transient alteration of awareness: Secondary | ICD-10-CM | POA: Diagnosis not present

## 2022-07-05 DIAGNOSIS — Z743 Need for continuous supervision: Secondary | ICD-10-CM | POA: Diagnosis not present

## 2022-07-05 DIAGNOSIS — R569 Unspecified convulsions: Secondary | ICD-10-CM | POA: Diagnosis not present

## 2022-07-05 DIAGNOSIS — T402X1A Poisoning by other opioids, accidental (unintentional), initial encounter: Secondary | ICD-10-CM | POA: Diagnosis not present

## 2022-07-05 DIAGNOSIS — F32A Depression, unspecified: Secondary | ICD-10-CM | POA: Diagnosis not present

## 2022-07-05 DIAGNOSIS — I129 Hypertensive chronic kidney disease with stage 1 through stage 4 chronic kidney disease, or unspecified chronic kidney disease: Secondary | ICD-10-CM | POA: Diagnosis not present

## 2022-07-05 DIAGNOSIS — I6782 Cerebral ischemia: Secondary | ICD-10-CM | POA: Diagnosis not present

## 2022-07-05 DIAGNOSIS — T50904A Poisoning by unspecified drugs, medicaments and biological substances, undetermined, initial encounter: Secondary | ICD-10-CM | POA: Diagnosis not present

## 2022-07-05 DIAGNOSIS — G319 Degenerative disease of nervous system, unspecified: Secondary | ICD-10-CM | POA: Diagnosis not present

## 2022-07-05 DIAGNOSIS — E7800 Pure hypercholesterolemia, unspecified: Secondary | ICD-10-CM | POA: Diagnosis not present

## 2022-07-06 ENCOUNTER — Encounter (HOSPITAL_COMMUNITY): Payer: Self-pay

## 2022-07-06 ENCOUNTER — Observation Stay (HOSPITAL_COMMUNITY): Payer: Medicare Other

## 2022-07-06 ENCOUNTER — Inpatient Hospital Stay (HOSPITAL_COMMUNITY)
Admission: RE | Admit: 2022-07-06 | Discharge: 2022-07-10 | DRG: 071 | Disposition: A | Discharge status: Home / Self Care | Payer: Medicare Other | Attending: Internal Medicine | Admitting: Internal Medicine

## 2022-07-06 ENCOUNTER — Other Ambulatory Visit: Payer: Self-pay | Admitting: Internal Medicine

## 2022-07-06 ENCOUNTER — Encounter (HOSPITAL_COMMUNITY): Payer: Self-pay | Admitting: Internal Medicine

## 2022-07-06 DIAGNOSIS — M5432 Sciatica, left side: Secondary | ICD-10-CM

## 2022-07-06 DIAGNOSIS — G2581 Restless legs syndrome: Secondary | ICD-10-CM | POA: Diagnosis present

## 2022-07-06 DIAGNOSIS — E781 Pure hyperglyceridemia: Secondary | ICD-10-CM | POA: Diagnosis not present

## 2022-07-06 DIAGNOSIS — Z7984 Long term (current) use of oral hypoglycemic drugs: Secondary | ICD-10-CM | POA: Diagnosis not present

## 2022-07-06 DIAGNOSIS — Z79899 Other long term (current) drug therapy: Secondary | ICD-10-CM

## 2022-07-06 DIAGNOSIS — E119 Type 2 diabetes mellitus without complications: Secondary | ICD-10-CM | POA: Diagnosis not present

## 2022-07-06 DIAGNOSIS — Z90710 Acquired absence of both cervix and uterus: Secondary | ICD-10-CM

## 2022-07-06 DIAGNOSIS — M5430 Sciatica, unspecified side: Secondary | ICD-10-CM | POA: Diagnosis present

## 2022-07-06 DIAGNOSIS — I161 Hypertensive emergency: Secondary | ICD-10-CM | POA: Diagnosis not present

## 2022-07-06 DIAGNOSIS — R4182 Altered mental status, unspecified: Secondary | ICD-10-CM | POA: Diagnosis not present

## 2022-07-06 DIAGNOSIS — E1142 Type 2 diabetes mellitus with diabetic polyneuropathy: Secondary | ICD-10-CM | POA: Diagnosis present

## 2022-07-06 DIAGNOSIS — E876 Hypokalemia: Secondary | ICD-10-CM | POA: Diagnosis not present

## 2022-07-06 DIAGNOSIS — R569 Unspecified convulsions: Secondary | ICD-10-CM

## 2022-07-06 DIAGNOSIS — I129 Hypertensive chronic kidney disease with stage 1 through stage 4 chronic kidney disease, or unspecified chronic kidney disease: Secondary | ICD-10-CM | POA: Diagnosis present

## 2022-07-06 DIAGNOSIS — E1165 Type 2 diabetes mellitus with hyperglycemia: Secondary | ICD-10-CM | POA: Diagnosis not present

## 2022-07-06 DIAGNOSIS — Z87442 Personal history of urinary calculi: Secondary | ICD-10-CM

## 2022-07-06 DIAGNOSIS — G8929 Other chronic pain: Secondary | ICD-10-CM | POA: Diagnosis not present

## 2022-07-06 DIAGNOSIS — R5381 Other malaise: Secondary | ICD-10-CM

## 2022-07-06 DIAGNOSIS — E890 Postprocedural hypothyroidism: Secondary | ICD-10-CM | POA: Diagnosis not present

## 2022-07-06 DIAGNOSIS — I639 Cerebral infarction, unspecified: Secondary | ICD-10-CM

## 2022-07-06 DIAGNOSIS — T40601D Poisoning by unspecified narcotics, accidental (unintentional), subsequent encounter: Secondary | ICD-10-CM | POA: Diagnosis not present

## 2022-07-06 DIAGNOSIS — E1122 Type 2 diabetes mellitus with diabetic chronic kidney disease: Secondary | ICD-10-CM | POA: Diagnosis present

## 2022-07-06 DIAGNOSIS — Z882 Allergy status to sulfonamides status: Secondary | ICD-10-CM

## 2022-07-06 DIAGNOSIS — F419 Anxiety disorder, unspecified: Secondary | ICD-10-CM | POA: Diagnosis not present

## 2022-07-06 DIAGNOSIS — I10 Essential (primary) hypertension: Secondary | ICD-10-CM | POA: Diagnosis not present

## 2022-07-06 DIAGNOSIS — M797 Fibromyalgia: Secondary | ICD-10-CM | POA: Diagnosis not present

## 2022-07-06 DIAGNOSIS — R400 Somnolence: Secondary | ICD-10-CM | POA: Diagnosis not present

## 2022-07-06 DIAGNOSIS — N1831 Chronic kidney disease, stage 3a: Secondary | ICD-10-CM | POA: Diagnosis present

## 2022-07-06 DIAGNOSIS — I6783 Posterior reversible encephalopathy syndrome: Principal | ICD-10-CM | POA: Diagnosis present

## 2022-07-06 DIAGNOSIS — G9340 Encephalopathy, unspecified: Secondary | ICD-10-CM | POA: Diagnosis present

## 2022-07-06 LAB — COMPREHENSIVE METABOLIC PANEL
ALT: 25 U/L (ref 0–44)
AST: 44 U/L — ABNORMAL HIGH (ref 15–41)
Albumin: 3.6 g/dL (ref 3.5–5.0)
Alkaline Phosphatase: 58 U/L (ref 38–126)
Anion gap: 15 (ref 5–15)
BUN: 17 mg/dL (ref 8–23)
CO2: 19 mmol/L — ABNORMAL LOW (ref 22–32)
Calcium: 8.7 mg/dL — ABNORMAL LOW (ref 8.9–10.3)
Chloride: 104 mmol/L (ref 98–111)
Creatinine, Ser: 0.98 mg/dL (ref 0.44–1.00)
GFR, Estimated: >60 mL/min (ref >60)
Glucose, Bld: 159 mg/dL — ABNORMAL HIGH (ref 70–99)
Potassium: 3.2 mmol/L — ABNORMAL LOW (ref 3.5–5.1)
Sodium: 138 mmol/L (ref 135–145)
Total Bilirubin: 1.1 mg/dL (ref 0.3–1.2)
Total Protein: 6.6 g/dL (ref 6.5–8.1)

## 2022-07-06 LAB — CBC WITH DIFFERENTIAL/PLATELET
Abs Immature Granulocytes: 0.14 10*3/uL — ABNORMAL HIGH (ref 0.00–0.07)
Basophils Absolute: 0 10*3/uL (ref 0.0–0.1)
Basophils Relative: 0 %
Eosinophils Absolute: 0.1 10*3/uL (ref 0.0–0.5)
Eosinophils Relative: 1 %
HCT: 41.3 % (ref 36.0–46.0)
Hemoglobin: 13.6 g/dL (ref 12.0–15.0)
Immature Granulocytes: 1 %
Lymphocytes Relative: 19 %
Lymphs Abs: 2.5 10*3/uL (ref 0.7–4.0)
MCH: 30 pg (ref 26.0–34.0)
MCHC: 32.9 g/dL (ref 30.0–36.0)
MCV: 91.2 fL (ref 80.0–100.0)
Monocytes Absolute: 1 10*3/uL (ref 0.1–1.0)
Monocytes Relative: 7 %
Neutro Abs: 9.4 10*3/uL — ABNORMAL HIGH (ref 1.7–7.7)
Neutrophils Relative %: 72 %
Platelets: 193 10*3/uL (ref 150–400)
RBC: 4.53 MIL/uL (ref 3.87–5.11)
RDW: 14.6 % (ref 11.5–15.5)
WBC: 13 10*3/uL — ABNORMAL HIGH (ref 4.0–10.5)
nRBC: 0 % (ref 0.0–0.2)

## 2022-07-06 LAB — PHOSPHORUS: Phosphorus: 3.2 mg/dL (ref 2.5–4.6)

## 2022-07-06 LAB — GLUCOSE, CAPILLARY: Glucose-Capillary: 175 mg/dL — ABNORMAL HIGH (ref 70–99)

## 2022-07-06 LAB — TSH: TSH: 1.161 u[IU]/mL (ref 0.350–4.500)

## 2022-07-06 LAB — MAGNESIUM: Magnesium: 2 mg/dL (ref 1.7–2.4)

## 2022-07-06 MED ORDER — LABETALOL HCL 5 MG/ML IV SOLN
5.0000 mg | INTRAVENOUS | Status: DC | PRN
  Administered 2022-07-07: 5 mg via INTRAVENOUS
  Filled 2022-07-06 (×2): qty 4

## 2022-07-06 MED ORDER — INSULIN ASPART 100 UNIT/ML IJ SOLN: 0.0000 [IU] | Freq: Three times a day (TID) | INTRAMUSCULAR | Status: DC

## 2022-07-06 MED ORDER — FENTANYL CITRATE PF 50 MCG/ML IJ SOSY
25.0000 ug | PREFILLED_SYRINGE | INTRAMUSCULAR | Status: DC | PRN
  Administered 2022-07-07 (×2): 25 ug via INTRAVENOUS
  Filled 2022-07-06 (×2): qty 1

## 2022-07-06 MED ORDER — ACETAMINOPHEN 325 MG PO TABS: 650.0000 mg | ORAL_TABLET | Freq: Four times a day (QID) | ORAL | Status: DC | PRN

## 2022-07-06 MED ORDER — CLEVIDIPINE BUTYRATE 0.5 MG/ML IV EMUL
0.0000 mg/h | INTRAVENOUS | Status: DC
  Filled 2022-07-06: qty 50

## 2022-07-06 MED ORDER — LISINOPRIL 5 MG PO TABS: 5.0000 mg | ORAL_TABLET | Freq: Every day | ORAL | Status: DC

## 2022-07-06 MED ORDER — ONDANSETRON HCL 4 MG PO TABS: 4.0000 mg | ORAL_TABLET | Freq: Four times a day (QID) | ORAL | Status: DC | PRN

## 2022-07-06 MED ORDER — ACETAMINOPHEN 650 MG RE SUPP: 650.0000 mg | Freq: Four times a day (QID) | RECTAL | Status: DC | PRN

## 2022-07-06 MED ORDER — LEVETIRACETAM IN NACL 500 MG/100ML IV SOLN
500.0000 mg | Freq: Two times a day (BID) | INTRAVENOUS | Status: DC
  Administered 2022-07-06 – 2022-07-08 (×5): 500 mg via INTRAVENOUS
  Filled 2022-07-06 (×5): qty 100

## 2022-07-06 MED ORDER — ENOXAPARIN SODIUM 40 MG/0.4ML IJ SOSY
40.0000 mg | PREFILLED_SYRINGE | INTRAMUSCULAR | Status: DC
  Administered 2022-07-07 – 2022-07-10 (×4): 40 mg via SUBCUTANEOUS
  Filled 2022-07-06 (×4): qty 0.4

## 2022-07-06 MED ORDER — HYDROCHLOROTHIAZIDE 25 MG PO TABS: 25.0000 mg | ORAL_TABLET | Freq: Every day | ORAL | Status: DC

## 2022-07-06 MED ORDER — ONDANSETRON HCL 4 MG/2ML IJ SOLN: 4.0000 mg | Freq: Four times a day (QID) | INTRAMUSCULAR | Status: DC | PRN

## 2022-07-06 MED ORDER — INSULIN ASPART 100 UNIT/ML IJ SOLN: 0.0000 [IU] | Freq: Every day | INTRAMUSCULAR | Status: DC

## 2022-07-06 MED ORDER — LABETALOL HCL 5 MG/ML IV SOLN: 5.0000 mg | INTRAVENOUS | Status: DC | PRN

## 2022-07-06 NOTE — Assessment & Plan Note (Signed)
Pt with recent new onset sciatica of L leg. MRI L spine reviewed above. Fentanyl PRN pain

## 2022-07-06 NOTE — H&P (Signed)
History and Physical    Patient: Allison Oneill WVP:[id number] DOB: March 29, 1951 DOA: 07/06/2022 DOS: the patient was seen and examined on 07/06/2022 PCP: Curlene Labrum, MD  Patient coming from: Outside Hospital  Chief Complaint:  HPI: Allison Oneill is a 72 y.o. female with medical history significant of HTN, DM2.  Pt with onset of back and L leg pain after lifting cat carrier earlier this week.  Seen at [facility] 1/27.  Diagnosed with sciatica, prescribed tramadol.  Seen again 1/28 - prescribed norco.  She had taken some of her Norco which she had been prescribed for this and later noted to be acting abnormally by family.   She returned back to their facility in the ED later in the day yesterday after being found somnolent and possible foaming at the mouth.  Initial concern was for opiate overdose.   UDS positive for opiates.  Blood workup negative for ethanol, aspirin, Tylenol.  Urinalysis with protein and glucose and blood only.  No significant electrolyte abnormalities, no leukocytosis.  CT head without acute abnormality.    Overnight patient had evidence of seizure and decreased responsiveness.  Received Ativan and was transferred to their ICU for close monitoring.   MRI did show, per read, changes that could be consistent with seizures, hypoglycemia, PRES.  No history of prior seizures per family.  Pt transferred to [facility].  Review of Systems: As mentioned in the history of present illness. All other systems reviewed and are negative. Past Medical History:  Diagnosis Date   Anxiety    Diabetes mellitus without complication ([place])    TYPE 2   Fibromyalgia    History of kidney stones    Hypertension    Left renal stone    Neuromuscular disorder ([place])    restless leg syndrome   Neuropathy    FEET   Past Surgical History:  Procedure Laterality Date   ABDOMINAL HYSTERECTOMY  2000   COMPLETE   CYSTOSCOPY N/A 08/27/2013   Procedure: CYSTOSCOPY; REMOVAL OF BLADDER CALCULUS;   Surgeon: [name], MD;  Location: [facility];  Service: Urology;  Laterality: N/A;   CYSTOSCOPY W/ URETERAL STENT PLACEMENT N/A 2012   CYSTOSCOPY W/ URETERAL STENT REMOVAL N/A 2013   IR URETERAL STENT LEFT NEW ACCESS W/O SEP NEPHROSTOMY CATH  08/07/2018   IR URETERAL STENT LEFT NEW ACCESS W/O SEP NEPHROSTOMY CATH  08/03/2021   LITHOTRIPSY  2012   NEPHROLITHOTOMY Left 08/07/2018   Procedure: NEPHROLITHOTOMY PERCUTANEOUS;  Surgeon: [name], MD;  Location: [facility];  Service: Urology;  Laterality: Left;   NEPHROLITHOTOMY Left 08/03/2021   Procedure: NEPHROLITHOTOMY PERCUTANEOUS;  Surgeon: [name], MD;  Location: [facility];  Service: Urology;  Laterality: Left;   NEUROMA SURGERY Left 1997   PARATHYROIDECTOMY Right 02/27/2019   Procedure: RIGHT INFERIOR PARATHYROIDECTOMY;  Surgeon: [name], MD;  Location: [facility];  Service: General;  Laterality: Right;   Social History:  reports that she has never smoked. She has never used smokeless tobacco. She reports that she does not drink alcohol and does not use drugs.  Allergies  Allergen Reactions   Sulfa Antibiotics Itching and Rash    History reviewed. No pertinent family history.  Prior to Admission medications   Medication Sig Start Date End Date Taking? Authorizing Provider  fluticasone (FLONASE) 50 MCG/ACT nasal spray Place 2 sprays into both nostrils daily as needed for allergies. 05/03/21  Yes [provider]  gabapentin (NEURONTIN) 300 MG capsule Take 1,200 mg by mouth at  bedtime.   Yes [provider]  glipiZIDE (GLUCOTROL XL) 10 MG 24 hr tablet Take 10 mg by mouth in the morning and at bedtime. 06/08/18  Yes [provider]  hydrochlorothiazide (HYDRODIURIL) 25 MG tablet Take 25 mg by mouth daily.   Yes [provider]  HYDROcodone-acetaminophen (NORCO/VICODIN) 5-325 MG tablet Take 1 tablet by mouth every 4 (four) hours as needed for moderate pain. 07/02/22 07/07/22 Yes [provider]  lisinopril (PRINIVIL,ZESTRIL) 5 MG tablet Take 5 mg by mouth every morning. 06/08/18  Yes [provider]  metFORMIN (GLUCOPHAGE-XR) 500 MG 24 hr tablet Take 500 mg by mouth in the morning and at bedtime. 06/08/18  Yes [provider]  rosuvastatin (CRESTOR) 20 MG tablet Take 20 mg by mouth at bedtime. 04/15/20  Yes [provider]  sertraline (ZOLOFT) 100 MG tablet Take 100 mg by mouth daily.   Yes [provider]  traMADol (ULTRAM) 50 MG tablet Take 1 tablet (50 mg total) by mouth every 6 (six) hours as needed. 08/04/21  Yes [name], MD  ACCU-CHEK GUIDE test strip daily. 01/29/20   [provider]  alfuzosin (UROXATRAL) 10 MG 24 hr tablet Take 1 tablet (10 mg total) by mouth daily with breakfast. Patient not taking: Reported on 07/06/2022 11/28/21   [name], MD  Blood Glucose Monitoring Suppl (ACCU-CHEK GUIDE ME) w/Device KIT daily. 01/29/20   [provider]  clonazePAM (KLONOPIN) 1 MG tablet Take 0.5-1 mg by mouth daily as needed for anxiety (panic attacks).    [provider]  fexofenadine (ALLEGRA) 180 MG tablet Take 180 mg by mouth at bedtime.     [provider]  Lancets Misc. (ACCU-CHEK FASTCLIX LANCET) KIT daily. 01/29/20   [provider]    Physical Exam: Vitals:   07/06/22 2012 07/06/22 2300  BP: (!) 140/69 (!) 164/69  Pulse: (!) 110 100  Resp: 20 20  Temp: 98.9 F (37.2 C)   TempSrc: Oral   SpO2: 93% 97%   Constitutional: mildly confused Respiratory: clear to auscultation bilaterally, no wheezing, no crackles. Normal respiratory effort. No accessory muscle use.  Cardiovascular: Regular rate and rhythm, no murmurs / rubs / gallops. No extremity edema. 2+ pedal pulses. No carotid bruits.  Abdomen: no tenderness, no masses palpated. No hepatosplenomegaly. Bowel sounds positive.  Neurologic: Severe L leg pain with any movement.  Does have B clonus to patellar reflex. Psychiatric:  Confused  Data Reviewed:  Results are pending, will review when available.  MRI L spine yesterday: 1. Multilevel lumbar spondylosis most pronounced at the L4-5 level  where there is severe bilateral subarticular recess stenosis with  mild-to-moderate canal stenosis and moderate bilateral foraminal  stenosis.  2. Mild-to-moderate bilateral foraminal stenosis at L5-S1, left  worse than right.   MRI brain today: Abnormal T2/FLAIR hyperintense signal within the white matter  involving both occipital and parietal lobes. This finding may be  secondary to posterior reversible encephalopathy syndrome (PRES),  secondary to seizure activity, hypoglycemia. Encephalitis is thought  less likely given symmetry.   Assessment and Plan: * PRES (posterior reversible encephalopathy syndrome) MRI brain with findings of PRES Seizure activity likely secondary to PRES per neurology. BP in ED looks to have been 200/80 on presentation. Currently maintaining SBPs between 130-150 without need for medications Will keep in progressive for now, put in for PRN labetalol for SBP > 150 or DBP > 100. If PRN labetalol is needed, then pt should be transferred to ICU for  cliviprex per neurologist. See Neuro note for further details. EEG TSH Tele monitor  Sciatica Pt with recent new onset sciatica of L leg. MRI L spine reviewed above. Fentanyl PRN pain  HTN (hypertension) Continue home HCTZ and lisinopril Additional BP meds for PRES as above.  DM2 (diabetes mellitus, type 2) (HCC) Mod scale SSI AC/HS  Stage 3a chronic kidney disease (CKD) (HCC) Creat 1.1 today at [facility]      Advance Care Planning:   Code Status: Full Code  Consults: Dr. Curly Sh[place]  Family Communication: No family in room  Severity of Illness: The appropriate patient status for this patient is OBSERVATION. Observation status is judged to be reasonable and necessary in order to provide the required intensity of service to ensure the  patient's safety. The patient's presenting symptoms, physical exam findings, and initial radiographic and laboratory data in the context of their medical condition is felt to place them at decreased risk for further clinical deterioration. Furthermore, it is anticipated that the patient will be medically stable for discharge from the hospital within 2 midnights of admission.   Author: Etta Quill., DO 07/06/2022 11:25 PM  For on call review [email or web address].

## 2022-07-06 NOTE — Progress Notes (Signed)
Technologist was informed patient is moving to ICU.

## 2022-07-06 NOTE — Consult Note (Signed)
Neurology Consultation Reason for Consult: Seizure w/ c/f PRES Requesting Physician: Jennette Kettle  CC: AMS   History is obtained from: Chart review, patient disoriented, family not reachable   HPI: Allison Oneill is a 72 y.o. female with a past medical history significant for hypertension, hyperlipidemia, type 2 diabetes, left-sided sciatica, restless leg syndrome, anxiety on Zoloft.  She initially presented to [facility] ED for altered mental status has been noted to have some foaming at the mouth.  Blood pressure on initial arrival approximately 200/100, but given repeated recent presentations to the ED for left-sided sciatica after picking up a cat carrier; there was initial concern for opiate overdose.  Notably she had tramadol prescribed 07/01/2022 followed by Norco prescribed the next day 07/02/2022; on 1/29 visit she was additionally complaining that her blood pressures have been uncontrolled since she was having such severe pain.  Psychiatry evaluation noted appropriate number of pills missing given prescription date and no concern for intentional or accidental overdose.  They also noted that the Zoloft on her medication list does not seem to be an active medication and that she has not been on clonazepam recently.  Subsequently during hospitalization she was noted to have seizure activity for which transfer to [facility] was requested given MRI brain findings concerning for PRES.   Labs at [facility] notable for initial AKI with creatinine to 1.28, improving to 1.10, BUN 25 improving to 24, glucose 292, CBC within normal limits, LFTs within normal limits with albumin of 3.1, A1c of 8.1% UDS positive only for opiates.  Her blood pressures did improve to the 497W systolic at [facility], no documented fevers, some tachycardia.  She was noted to be protecting her airway but remained substantially confused  Patient denies any prior history of seizures, also reports it is 2014.   Denies headache at this time or other acute complaints. Full seizure history not obtained as family not available and patient confused   ROS: Unable to obtain due to altered mental status.   Past Medical History:  Diagnosis Date   Anxiety    Diabetes mellitus without complication ([place])    TYPE 2   Fibromyalgia    History of kidney stones    Hypertension    Left renal stone    Neuromuscular disorder ([place])    restless leg syndrome   Neuropathy    FEET   Past Surgical History:  Procedure Laterality Date   ABDOMINAL HYSTERECTOMY  2000   COMPLETE   CYSTOSCOPY N/A 08/27/2013   Procedure: CYSTOSCOPY; REMOVAL OF BLADDER CALCULUS;  Surgeon: [name], MD;  Location: [facility];  Service: Urology;  Laterality: N/A;   CYSTOSCOPY W/ URETERAL STENT PLACEMENT N/A 2012   CYSTOSCOPY W/ URETERAL STENT REMOVAL N/A 2013   IR URETERAL STENT LEFT NEW ACCESS W/O SEP NEPHROSTOMY CATH  08/07/2018   IR URETERAL STENT LEFT NEW ACCESS W/O SEP NEPHROSTOMY CATH  08/03/2021   LITHOTRIPSY  2012   NEPHROLITHOTOMY Left 08/07/2018   Procedure: NEPHROLITHOTOMY PERCUTANEOUS;  Surgeon: [name], MD;  Location: [facility];  Service: Urology;  Laterality: Left;   NEPHROLITHOTOMY Left 08/03/2021   Procedure: NEPHROLITHOTOMY PERCUTANEOUS;  Surgeon: [name], MD;  Location: [facility];  Service: Urology;  Laterality: Left;   NEUROMA SURGERY Left 1997   PARATHYROIDECTOMY Right 02/27/2019   Procedure: RIGHT INFERIOR PARATHYROIDECTOMY;  Surgeon: [name], MD;  Location: [facility];  Service: General;  Laterality: Right;   Current Outpatient Medications  Medication Instructions   ACCU-CHEK  GUIDE test strip Daily   alfuzosin (UROXATRAL) 10 mg, Oral, Daily with breakfast   Blood Glucose Monitoring Suppl (ACCU-CHEK GUIDE ME) w/Device KIT Daily   clonazePAM (KLONOPIN) 0.5-1 mg, Oral, Daily PRN   fexofenadine (ALLEGRA) 180 mg, Oral, Daily at bedtime   fluticasone (FLONASE) 50 MCG/ACT nasal spray 2 sprays, Each Nare,  Daily PRN   gabapentin (NEURONTIN) 1,200 mg, Oral, Daily at bedtime   glipiZIDE (GLUCOTROL XL) 10 mg, Oral, 2 times daily   hydrochlorothiazide (HYDRODIURIL) 25 mg, Oral, Daily   HYDROcodone-acetaminophen (NORCO/VICODIN) 5-325 MG tablet 1 tablet, Oral, Every 4 hours PRN   Lancets Misc. (ACCU-CHEK FASTCLIX LANCET) KIT Daily   lisinopril (ZESTRIL) 5 mg, Oral, BH-each morning   metFORMIN (GLUCOPHAGE-XR) 500 mg, Oral, 2 times daily   rosuvastatin (CRESTOR) 20 mg, Oral, Daily at bedtime   sertraline (ZOLOFT) 100 mg, Oral, Daily   traMADol (ULTRAM) 50 mg, Oral, Every 6 hours PRN   History reviewed. No pertinent family history.  Social History:  reports that she has never smoked. She has never used smokeless tobacco. She reports that she does not drink alcohol and does not use drugs.   Exam: Current vital signs: BP (!) 140/69 (BP Location: Left Arm)   Pulse (!) 110   Temp 98.9 F (37.2 C) (Oral)   Resp 20   SpO2 93%  Vital signs in last 24 hours: Temp:  [98.9 F (37.2 C)] 98.9 F (37.2 C) (02/02 2012) Pulse Rate:  [110] 110 (02/02 2012) Resp:  [20] 20 (02/02 2012) BP: (140)/(69) 140/69 (02/02 2012) SpO2:  [93 %] 93 % (02/02 2012)   Physical Exam  Constitutional: Appears well-developed and well-nourished.  Psych: Affect appropriate to situation, pleasant and cooperative but sometimes tearful secondary to left-sided sciatica Eyes: Mild injection of the right eye HENT: No oropharyngeal obstruction. + Tongue bite MSK: no joint deformities.  Cardiovascular: Tachycardic with regular rhythm  Respiratory: Effort normal, non-labored breathing GI: Soft.  No distension. There is no tenderness.  Skin: Warm dry and intact visible skin  Neuro: Mental Status: Patient is awake, alert, oriented to person, [place] but reports it is 2014, oriented , month, year, and situation. Patient is able to give limited history  Mild left sided neglect on motor testing (repeatedly tries to use right  limbs when asked to use left side) Cranial Nerves: II: Visual Fields -- able to count finger in all quadrants but does fovate more on the left side. Pupils are equal, round, and reactive to light, at times left pupil slightly larger than the right.   III,IV, VI: EOMI with ptosis bilaterally R > L V: Facial sensation is symmetric to light touch VII: Facial movement is symmetric.  VIII: hearing is hard of hearing but able to respond X: Uvula elevates symmetrically XI: Shoulder shrug is symmetric. XII: tongue is midline without atrophy or fasciculations.  Motor: Pain limited left lower exam but can lift briefly antigravity. RLE maintains antigravity, UE with sight pronator drift R > L  Sensory: Sensation is symmetric to light touch in the arms and legs. Deep Tendon Reflexes: 3+ and symmetric in the brachioradialis 4+ patellae (3-4 beats clonus) Cerebellar: Finger to nose mildly dysmetric on the LUE. Heel to shin deferred due to siactic pain Gait:  Deferred due to dizziness, generalized weakness, truncal ataxia on sitting up on edge of bed  I have reviewed labs in epic and the results pertinent to this consultation are:  Basic Metabolic Panel: Recent Labs  Lab 07/06/22 2238  NA 138  K 3.2*  CL 104  CO2 19*  GLUCOSE 159*  BUN 17  CREATININE 0.98  CALCIUM 8.7*  MG 2.0  PHOS 3.2   Lab Results  Component Value Date   ALT 25 07/06/2022   AST 44 (H) 07/06/2022   ALKPHOS 58 07/06/2022   BILITOT 1.1 07/06/2022     CBC: Recent Labs  Lab 07/06/22 2238  WBC 13.0*  NEUTROABS 9.4*  HGB 13.6  HCT 41.3  MCV 91.2  PLT 193    Coagulation Studies: No results for input(s): "LABPROT", "INR" in the last 72 hours.      I have reviewed the images obtained:  MRI brain personally reviewed, agree with radiology:   Abnormal T2/FLAIR hyperintense signal within the white matter  involving both occipital and parietal lobes. This finding may be  secondary to posterior reversible  encephalopathy syndrome (PRES),  secondary to seizure activity, hypoglycemia. Encephalitis is thought  less likely given symmetry.    MRI lumbar spine 1. Multilevel lumbar spondylosis most pronounced at the L4-5 level where there is severe bilateral subarticular recess stenosis with mild-to-moderate canal stenosis and moderate bilateral foraminal stenosis. 2. Mild-to-moderate bilateral foraminal stenosis at L5-S1, left worse than right.    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 Possibly blood pressure increase in the setting of uncontrolled pain triggering PRES, but serotonin effects from tramadol may also be playing a role. Will additionally obtain blood vessel imaging to rule out vasculitic process, overall low concern for infection as an etiology at this time given patient has been afebrile and initial CBC was reassuring.  Suspect with mild leukocytosis at the time of arrival to [facility] is reactive secondary to seizure.  Continue to trend. EEG to rule out NCSE / subclinical seizures given high risk of this due to PRES.   Recommendations: - Strict BP control 130 to 150 mmHg/80 to 100 mmHg; reduce by 10 to 20 mmHg every 10 to 20 minutes to achieve goal   - Meeting goal spontaneously at this time, low threshold to move to ICU if BP control needed  - LTM EEG w/ CT compatible leads  - Keppra 500 mg BID for now, s/p 500 mg at 1407 at [facility]  - CTA/CTV to rule out vasculitis / venous thrombosis - Hold tramadol, sertraline and any other serotonergic agents  - Ammonia, HIV, RPR, B12, thiamine, folate for reversible causes - Appreciate pain control per primary team and management of other comorbidities   - Neurology will follow along  Lesleigh Noe MD-PhD [facility] [phone] Available 7 PM to 7 AM, outside of these hours please call Neurologist on call as listed on Amion.

## 2022-07-06 NOTE — Assessment & Plan Note (Signed)
Continue home HCTZ and lisinopril Additional BP meds for PRES as above.

## 2022-07-06 NOTE — Assessment & Plan Note (Signed)
MRI brain with findings of PRES Seizure activity likely secondary to PRES per neurology. BP in ED looks to have been 200/80 on presentation. Currently maintaining SBPs between 130-150 without need for medications Will keep in progressive for now, put in for PRN labetalol for SBP > 150 or DBP > 100. If PRN labetalol is needed, then pt should be transferred to ICU for cliviprex per neurologist. See Neuro note for further details. EEG TSH Tele monitor

## 2022-07-06 NOTE — Assessment & Plan Note (Signed)
Mod scale SSI AC/HS

## 2022-07-06 NOTE — Assessment & Plan Note (Signed)
Creat 1.1 today at [employer]

## 2022-07-06 NOTE — Progress Notes (Signed)
Plan of Care Note for accepted transfer   Patient: Allison Oneill MRN: [id number]   DOA: (Not on file)  Facility requesting transfer: [facility] Requesting Provider: Dr. Calvert Cantor, hospitalist Reason for transfer: Services not offered at their facility, neurology, EEG.  Patient family request [facility]. Facility course:   Patient initially presented for workup of back pain and MRI yesterday.  She had taken some of her Norco which she had been prescribed for this and later noted to be acting abnormally by family.  She returned back to their facility in the ED later in the day yesterday after being found somnolent and possible foaming at the mouth.  Initial concern was for opiate overdose as EDP reported patient has history of drug-seeking behavior and with recent Norco prescription.  UDS positive for opiates.  Blood workup negative for ethanol, aspirin, Tylenol.  Urinalysis with protein and glucose and blood only.  No significant electrolyte abnormalities, no leukocytosis.  CT head without acute abnormality.  Vital signs have been stable.  Blood pressure in the 160s today.  Overnight patient had evidence of seizure and decreased responsiveness.  Received Ativan and was transferred to their ICU for close monitoring.  Currently her attending provider states that she is protecting her airway and does not leave the need critical care involvement.  He states they do not have neurology or ability to perform EEG at their facility.  Family requested [facility].  MRI did show, per read, changes that could be consistent with seizures, hypoglycemia, PRES.  No history of prior seizures per family.  I have tentatively accepted patient for transfer provided transferring provider speaks with our neurology team first for initial recommendations and acceptance of this patient in consultation.  Plan of care: The patient is accepted for admission to Progressive unit, at [facility].  Author: Marcelyn Bruins, MD 07/06/2022  Check [email or web address] for on-call coverage.  Nursing staff, Please call [place] number on Amion as soon as patient's arrival, so appropriate admitting provider can evaluate the pt.

## 2022-07-07 ENCOUNTER — Observation Stay (HOSPITAL_COMMUNITY): Payer: Medicare Other

## 2022-07-07 DIAGNOSIS — E876 Hypokalemia: Secondary | ICD-10-CM | POA: Diagnosis not present

## 2022-07-07 DIAGNOSIS — N1831 Chronic kidney disease, stage 3a: Secondary | ICD-10-CM | POA: Diagnosis present

## 2022-07-07 DIAGNOSIS — E1122 Type 2 diabetes mellitus with diabetic chronic kidney disease: Secondary | ICD-10-CM | POA: Diagnosis present

## 2022-07-07 DIAGNOSIS — E1165 Type 2 diabetes mellitus with hyperglycemia: Secondary | ICD-10-CM | POA: Diagnosis present

## 2022-07-07 DIAGNOSIS — R569 Unspecified convulsions: Secondary | ICD-10-CM | POA: Diagnosis present

## 2022-07-07 DIAGNOSIS — E1142 Type 2 diabetes mellitus with diabetic polyneuropathy: Secondary | ICD-10-CM | POA: Diagnosis present

## 2022-07-07 DIAGNOSIS — Z79899 Other long term (current) drug therapy: Secondary | ICD-10-CM | POA: Diagnosis not present

## 2022-07-07 DIAGNOSIS — I6783 Posterior reversible encephalopathy syndrome: Secondary | ICD-10-CM | POA: Diagnosis present

## 2022-07-07 DIAGNOSIS — E781 Pure hyperglyceridemia: Secondary | ICD-10-CM | POA: Diagnosis present

## 2022-07-07 DIAGNOSIS — E890 Postprocedural hypothyroidism: Secondary | ICD-10-CM | POA: Diagnosis present

## 2022-07-07 DIAGNOSIS — G2581 Restless legs syndrome: Secondary | ICD-10-CM | POA: Diagnosis present

## 2022-07-07 DIAGNOSIS — Z882 Allergy status to sulfonamides status: Secondary | ICD-10-CM | POA: Diagnosis not present

## 2022-07-07 DIAGNOSIS — I161 Hypertensive emergency: Secondary | ICD-10-CM | POA: Diagnosis present

## 2022-07-07 DIAGNOSIS — Z7984 Long term (current) use of oral hypoglycemic drugs: Secondary | ICD-10-CM | POA: Diagnosis not present

## 2022-07-07 DIAGNOSIS — I129 Hypertensive chronic kidney disease with stage 1 through stage 4 chronic kidney disease, or unspecified chronic kidney disease: Secondary | ICD-10-CM | POA: Diagnosis present

## 2022-07-07 DIAGNOSIS — R4182 Altered mental status, unspecified: Secondary | ICD-10-CM | POA: Diagnosis not present

## 2022-07-07 DIAGNOSIS — I639 Cerebral infarction, unspecified: Secondary | ICD-10-CM

## 2022-07-07 DIAGNOSIS — F419 Anxiety disorder, unspecified: Secondary | ICD-10-CM | POA: Diagnosis present

## 2022-07-07 DIAGNOSIS — Z87442 Personal history of urinary calculi: Secondary | ICD-10-CM | POA: Diagnosis not present

## 2022-07-07 DIAGNOSIS — Z90710 Acquired absence of both cervix and uterus: Secondary | ICD-10-CM | POA: Diagnosis not present

## 2022-07-07 DIAGNOSIS — G8929 Other chronic pain: Secondary | ICD-10-CM | POA: Diagnosis present

## 2022-07-07 DIAGNOSIS — M797 Fibromyalgia: Secondary | ICD-10-CM | POA: Diagnosis present

## 2022-07-07 DIAGNOSIS — M5432 Sciatica, left side: Secondary | ICD-10-CM | POA: Diagnosis present

## 2022-07-07 DIAGNOSIS — G9340 Encephalopathy, unspecified: Secondary | ICD-10-CM | POA: Diagnosis present

## 2022-07-07 LAB — GLUCOSE, CAPILLARY
Glucose-Capillary: 151 mg/dL — ABNORMAL HIGH (ref 70–99)
Glucose-Capillary: 162 mg/dL — ABNORMAL HIGH (ref 70–99)
Glucose-Capillary: 156 mg/dL — ABNORMAL HIGH (ref 70–99)
Glucose-Capillary: 136 mg/dL — ABNORMAL HIGH (ref 70–99)
Glucose-Capillary: 141 mg/dL — ABNORMAL HIGH (ref 70–99)

## 2022-07-07 LAB — RPR: RPR Ser Ql: NONREACTIVE

## 2022-07-07 LAB — CBC
HCT: 38 % (ref 36.0–46.0)
Hemoglobin: 12.6 g/dL (ref 12.0–15.0)
MCH: 30.4 pg (ref 26.0–34.0)
MCHC: 33.2 g/dL (ref 30.0–36.0)
MCV: 91.8 fL (ref 80.0–100.0)
Platelets: 217 10*3/uL (ref 150–400)
RBC: 4.14 MIL/uL (ref 3.87–5.11)
RDW: 14.6 % (ref 11.5–15.5)
WBC: 11.3 10*3/uL — ABNORMAL HIGH (ref 4.0–10.5)
nRBC: 0 % (ref 0.0–0.2)

## 2022-07-07 LAB — BASIC METABOLIC PANEL
Anion gap: 12 (ref 5–15)
BUN: 15 mg/dL (ref 8–23)
CO2: 22 mmol/L (ref 22–32)
Calcium: 8.5 mg/dL — ABNORMAL LOW (ref 8.9–10.3)
Chloride: 103 mmol/L (ref 98–111)
Creatinine, Ser: 1.02 mg/dL — ABNORMAL HIGH (ref 0.44–1.00)
GFR, Estimated: 58 mL/min — ABNORMAL LOW (ref >60)
Glucose, Bld: 159 mg/dL — ABNORMAL HIGH (ref 70–99)
Potassium: 3.6 mmol/L (ref 3.5–5.1)
Sodium: 137 mmol/L (ref 135–145)

## 2022-07-07 LAB — APTT: aPTT: 28 seconds (ref 24–36)

## 2022-07-07 LAB — HEMOGLOBIN A1C
Hgb A1c MFr Bld: 8.1 % — ABNORMAL HIGH (ref 4.8–5.6)
Mean Plasma Glucose: 185.77 mg/dL

## 2022-07-07 LAB — HIV ANTIBODY (ROUTINE TESTING W REFLEX): HIV Screen 4th Generation wRfx: NONREACTIVE

## 2022-07-07 LAB — MAGNESIUM: Magnesium: 1.9 mg/dL (ref 1.7–2.4)

## 2022-07-07 LAB — PROTIME-INR
INR: 1.1 (ref 0.8–1.2)
Prothrombin Time: 13.9 seconds (ref 11.4–15.2)

## 2022-07-07 LAB — FOLATE: Folate: 21.2 ng/mL (ref >5.9)

## 2022-07-07 LAB — VITAMIN B12: Vitamin B-12: 171 pg/mL — ABNORMAL LOW (ref 180–914)

## 2022-07-07 LAB — AMMONIA: Ammonia: 26 umol/L (ref 9–35)

## 2022-07-07 MED ORDER — CHLORHEXIDINE GLUCONATE CLOTH 2 % EX PADS
6.0000 | MEDICATED_PAD | Freq: Every day | CUTANEOUS | Status: DC
  Administered 2022-07-07 – 2022-07-09 (×3): 6 via TOPICAL

## 2022-07-07 MED ORDER — POTASSIUM CHLORIDE 20 MEQ PO PACK
40.0000 meq | PACK | Freq: Once | ORAL | Status: AC
  Administered 2022-07-07: 40 meq via ORAL
  Filled 2022-07-07: qty 2

## 2022-07-07 MED ORDER — CLEVIDIPINE BUTYRATE 0.5 MG/ML IV EMUL
0.0000 mg/h | INTRAVENOUS | Status: DC
  Administered 2022-07-07: 2 mg/h via INTRAVENOUS
  Filled 2022-07-07 (×2): qty 50

## 2022-07-07 MED ORDER — LABETALOL HCL 5 MG/ML IV SOLN
10.0000 mg | INTRAVENOUS | Status: DC | PRN
  Administered 2022-07-08: 10 mg via INTRAVENOUS
  Filled 2022-07-07: qty 4

## 2022-07-07 MED ORDER — INSULIN ASPART 100 UNIT/ML IJ SOLN
0.0000 [IU] | INTRAMUSCULAR | Status: DC
  Administered 2022-07-07 (×2): 2 [IU] via SUBCUTANEOUS
  Administered 2022-07-07: 1 [IU] via SUBCUTANEOUS
  Administered 2022-07-07: 2 [IU] via SUBCUTANEOUS
  Administered 2022-07-07: 1 [IU] via SUBCUTANEOUS
  Administered 2022-07-08: 3 [IU] via SUBCUTANEOUS
  Administered 2022-07-08: 5 [IU] via SUBCUTANEOUS
  Administered 2022-07-08 (×3): 2 [IU] via SUBCUTANEOUS
  Administered 2022-07-08 (×2): 1 [IU] via SUBCUTANEOUS
  Administered 2022-07-09: 3 [IU] via SUBCUTANEOUS
  Administered 2022-07-09: 2 [IU] via SUBCUTANEOUS
  Administered 2022-07-09: 3 [IU] via SUBCUTANEOUS
  Administered 2022-07-09 (×2): 2 [IU] via SUBCUTANEOUS

## 2022-07-07 MED ORDER — HYDRALAZINE HCL 20 MG/ML IJ SOLN
10.0000 mg | INTRAMUSCULAR | Status: DC | PRN
  Administered 2022-07-07: 10 mg via INTRAVENOUS
  Filled 2022-07-07 (×2): qty 1

## 2022-07-07 MED ORDER — POTASSIUM CHLORIDE 10 MEQ/100ML IV SOLN
10.0000 meq | INTRAVENOUS | Status: AC
  Administered 2022-07-07 (×4): 10 meq via INTRAVENOUS
  Filled 2022-07-07 (×4): qty 100

## 2022-07-07 MED ORDER — VITAMIN B-12 1000 MCG PO TABS
1000.0000 ug | ORAL_TABLET | Freq: Every day | ORAL | Status: DC
  Administered 2022-07-08: 1000 ug
  Filled 2022-07-07: qty 1

## 2022-07-07 MED ORDER — IOHEXOL 350 MG/ML SOLN
100.0000 mL | Freq: Once | INTRAVENOUS | Status: AC | PRN
  Administered 2022-07-07: 100 mL via INTRAVENOUS

## 2022-07-07 NOTE — Progress Notes (Signed)
Pt was transferd to 4NP ICU. Alert with some confusion, on EEG monitor. Verbal report given to receiving nurse

## 2022-07-07 NOTE — Progress Notes (Signed)
Neurology Progress Note  Brief HPI: Patient with history of hypertension, hyperlipidemia, diabetes, left-sided sciatica, anxiety and and restless leg syndrome initially presented with altered mental status and was quite hypertensive with blood pressure being 200/100.  She had recently been seen for left-sided sciatica and had had tramadol and Norco prescribed for her.  There was concern that blood pressure was uncontrolled due to severe pain.  She was then noted to have seizure activity, and MRI brain revealed findings concerning for PRES.   Subjective: Patient has been in the ICU on low-dose Cleviprex and has been able to maintain her blood pressure goal, but is still confused.  Exam: Vitals:   07/07/22 0630 07/07/22 0645  BP: (!) 154/69 137/60  Pulse: 87 88  Resp: (!) 22 20  Temp:    SpO2: (!) 88% 94%   Gen: In bed, NAD Resp: non-labored breathing, no acute distress  Neuro: Mental Status: Drowsy, requiring repeated stimulation to attend to exam and oriented to person but not place, able to state correct year today.  She is able to follow simple commands, sometimes with multiple prompts but cannot follow two-step commands. Cranial Nerves: Pupils equal round and reactive, extraocular movements intact, facial sensation symmetrical, face symmetrical, hearing intact to voice, phonation normal, shoulder shrug symmetrical, tongue midline Motor: Able to move bilateral upper extremities with good antigravity strength, able to wiggle toes on bilateral feet but does not pick them up off the bed, possibly due to pain from sciatica Sensory: Intact to light touch throughout Gait: Deferred  Pertinent Labs:    Latest Ref Rng & Units 07/07/2022    3:20 AM 07/06/2022   10:38 PM 08/04/2021    4:18 AM  CBC  WBC 4.0 - 10.5 K/uL 11.3  13.0    Hemoglobin 12.0 - 15.0 g/dL 12.6  13.6  11.8   Hematocrit 36.0 - 46.0 % 38.0  41.3  35.5   Platelets 150 - 400 K/uL 217  193         Latest Ref Rng & Units  07/07/2022    3:20 AM 07/06/2022   10:38 PM 07/14/2021   11:48 AM  BMP  Glucose 70 - 99 mg/dL 159  159  171   BUN 8 - 23 mg/dL 15  17  36   Creatinine 0.44 - 1.00 mg/dL 1.02  0.98  1.31   Sodium 135 - 145 mmol/L 137  138  140   Potassium 3.5 - 5.1 mmol/L 3.6  3.2  3.7   Chloride 98 - 111 mmol/L 103  104  105   CO2 22 - 32 mmol/L '22  19  25   '$ Calcium 8.9 - 10.3 mg/dL 8.5  8.7  9.9   RPR: Negative HIV: Negative TSH: 1.161 B12: 171 Folate: 21.2 Thiamine: Pending  Imaging Reviewed: CT head: Bihemispheric posterior hypoattenuation corresponding to areas of hyperintense T2 weighted signal on prior MRI, likely indicating press  CTA head: No LVO or hemodynamically significant stenosis  CTV head: No evidence of dural venous sinus thrombosis  EEG: Study within normal limits with no seizure activity  Assessment: 72 year old patient with the above past medical history presents with likely press in the setting of uncontrolled hypertension due to pain.  She had some seizure activity at outside hospital, but EEG here has remained negative.  Her blood pressure has been well-controlled, but she remains confused.  Vitamin B12 is noted to be low, so will supplement.  CTA and CTV were negative for vasculitis or dural venous  sinus thrombosis.  Patient has no complaints of pain today, so this should not contribute to her hypertension.  Leukocytosis is improving today, and patient is afebrile, so no indication for LP at this time.  Impression: PRES in patient with uncontrolled hypertension likely due to pain  Recommendations: 1) strict blood pressure control with systolic blood pressure 062-376, diastolic 28-315, will use as needed labetalol and hydralazine and Cleviprex if those are not sufficient 2) continue long-term EEG for 1 more day 3) Keppra 500 mg twice daily 4) vitamin B12 1000 mg p.o. daily 5) avoid tramadol, sertraline and other serotonergic agents 6) neurology will continue to  follow  [place] , MSN, AGACNP-BC [facility] See Amion for schedule and pager information 07/07/2022 8:46 AM   NEUROHOSPITALIST ADDENDUM Performed a face to face diagnostic evaluation.   I have reviewed the contents of history and physical exam as documented by PA/ARNP/Resident and agree with above documentation.  I have discussed and formulated the above plan as documented. Edits to the note have been made as needed.  Impression/Key exam findings/Plan: encephalopathic and on low dose Cleviprex. Plan to wean off and transfer to floor. Continue Keppra '500mg'$  BID for atleast a few months. Can be weaned off outpatient.  We will continue to follow.  Donnetta Simpers, MD [facility] [phone]   If 7pm to 7am, please call on call as listed on AMION.

## 2022-07-07 NOTE — Consult Note (Signed)
NAME:  Allison Oneill, MRN:  [id number], DOB:  24-Mar-1951, LOS: 1 ADMISSION DATE:  07/06/2022, CONSULTATION DATE:  2/3 REFERRING MD:  Dr. Alcario Drought, CHIEF COMPLAINT:  Possible PRES   History of Present Illness:  Patient is a 72 year old female with pertinent PMH HTN, DMT2 presents to [facility] on 2/1 with AMS.  Patient recently diagnosed with sciatica and has been taking tramadol and Norco for pain.  On 2/1 patient more somnolent and foaming at the mouth.  Taken to [facility] ED for further eval.  BP on arrival 200/100.  UDS positive for opiates.  Ethanol, ASA, Tylenol WNL.  UA unremarkable.  CT head with no acute abnormality.  Overnight patient with seizure activity and less responsive.  Given Ativan.  MRI consistent with seizures, hypoglycemia, press.  Patient transferred to [facility] 2/2 for further eval.  Upon arrival to [facility] on 2/2, SBP less than 160.  Afebrile.  Some confusion but able to protect airway.  Patient denies any headache.  No history of seizures.  Neuro consulted.  Recommends starting patient on Cleviprex and transferring to ICU.  PCCM consulted.  Pertinent  Medical History   Past Medical History:  Diagnosis Date   Anxiety    Diabetes mellitus without complication ([place])    TYPE 2   Fibromyalgia    History of kidney stones    Hypertension    Left renal stone    Neuromuscular disorder ([place])    restless leg syndrome   Neuropathy    FEET     Significant Hospital Events: Including procedures, antibiotic start and stop dates in addition to other pertinent events   2/2 patient transferred from [facility] to [facility] for further eval for possible press; started on Cleviprex; PCCM consulted  Interim History / Subjective:  See above  Objective   Blood pressure (!) 164/69, pulse 100, temperature 98.9 F (37.2 C), temperature source Oral, resp. rate 20, SpO2 97 %.       No intake or output data in the 24 hours ending 07/07/22 0144 There were no vitals filed for this  visit.  Examination: General:   NAD HEENT: MM pink/moist; [place] in place Neuro: Altered; able to state name; weak but b/l muscle strength UE and LE; PERRL CV: s1s2, no m/r/g PULM:  dim clear BS bilaterally; [place] 2L GI: soft, bsx4 active  Extremities: warm/dry, no edema  Skin: no rashes or lesions    Resolved Hospital Problem list     Assessment & Plan:  Acute encephalopathy: UDS positive for opiates Possible PRES Seizure Plan: -neuro following; appreciate recs -Further imaging per neuro -EEG -AEDs per neuro -seizure precautions -frequent neuro checks -limit sedating meds -Continue Cleviprex for SBP goal 130-150 per neuro -statin, DAPT per neuro -Continue neuroprotective measures- normothermia, euglycemia, HOB greater than 30, head in neutral alignment, normocapnia, normoxia.   Sciatica Plan: -prn fentanyl  HTN Plan: -Continue Cleviprex as above -Hold home anti-hypertensives  DMT2 Plan: -SSI and CBG monitoring  Hypokalemia CKD stage IIIa Plan: -replete K -Trend BMP / urinary output -Replace electrolytes as indicated -Avoid nephrotoxic agents, ensure adequate renal perfusion  Leukocytosis Plan: -UA unremarkable at [facility] -likely reactive -trend wbc/fever curve  Anxiety Plan: -hold home meds per neuro  Best Practice (right click and "Reselect all SmartList Selections" daily)   Diet/type: Regular consistency (see orders) DVT prophylaxis: LMWH GI prophylaxis: N/A Lines: N/A Foley:  N/A Code Status:  full code Last date of multidisciplinary goals of care discussion [2/3 updated friend Gregary Signs  over phone]  Labs   CBC: Recent Labs  Lab 07/06/22 2238  WBC 13.0*  NEUTROABS 9.4*  HGB 13.6  HCT 41.3  MCV 91.2  PLT 673    Basic Metabolic Panel: Recent Labs  Lab 07/06/22 2238  NA 138  K 3.2*  CL 104  CO2 19*  GLUCOSE 159*  BUN 17  CREATININE 0.98  CALCIUM 8.7*  MG 2.0  PHOS 3.2   GFR: CrCl cannot be calculated (Unknown ideal  weight.). Recent Labs  Lab 07/06/22 2238  WBC 13.0*    Liver Function Tests: Recent Labs  Lab 07/06/22 2238  AST 44*  ALT 25  ALKPHOS 58  BILITOT 1.1  PROT 6.6  ALBUMIN 3.6   No results for input(s): "LIPASE", "AMYLASE" in the last 168 hours. Recent Labs  Lab 07/06/22 2332  AMMONIA 26    ABG No results found for: "PHART", "PCO2ART", "PO2ART", "HCO3", "TCO2", "ACIDBASEDEF", "O2SAT"   Coagulation Profile: Recent Labs  Lab 07/06/22 2332  INR 1.1    Cardiac Enzymes: No results for input(s): "CKTOTAL", "CKMB", "CKMBINDEX", "TROPONINI" in the last 168 hours.  HbA1C: Hgb A1c MFr Bld  Date/Time Value Ref Range Status  07/06/2022 11:32 PM 8.1 (H) 4.8 - 5.6 % Final    Comment:    (NOTE) Pre diabetes:          5.7%-6.4%  Diabetes:              >6.4%  Glycemic control for   <7.0% adults with diabetes   07/14/2021 11:48 AM 7.9 (H) 4.8 - 5.6 % Final    Comment:    (NOTE) Pre diabetes:          5.7%-6.4%  Diabetes:              >6.4%  Glycemic control for   <7.0% adults with diabetes     CBG: Recent Labs  Lab 07/06/22 2331  GLUCAP 175*    Review of Systems:   Patient is encephalopathic. Therefore history has been obtained from chart review.    Past Medical History:  She,  has a past medical history of Anxiety, Diabetes mellitus without complication ([place]), Fibromyalgia, History of kidney stones, Hypertension, Left renal stone, Neuromuscular disorder ([place]), and Neuropathy.   Surgical History:   Past Surgical History:  Procedure Laterality Date   ABDOMINAL HYSTERECTOMY  2000   COMPLETE   CYSTOSCOPY N/A 08/27/2013   Procedure: CYSTOSCOPY; REMOVAL OF BLADDER CALCULUS;  Surgeon: [name], MD;  Location: [facility];  Service: Urology;  Laterality: N/A;   CYSTOSCOPY W/ URETERAL STENT PLACEMENT N/A 2012   CYSTOSCOPY W/ URETERAL STENT REMOVAL N/A 2013   IR URETERAL STENT LEFT NEW ACCESS W/O SEP NEPHROSTOMY CATH  08/07/2018   IR URETERAL STENT LEFT NEW  ACCESS W/O SEP NEPHROSTOMY CATH  08/03/2021   LITHOTRIPSY  2012   NEPHROLITHOTOMY Left 08/07/2018   Procedure: NEPHROLITHOTOMY PERCUTANEOUS;  Surgeon: [name], MD;  Location: [facility];  Service: Urology;  Laterality: Left;   NEPHROLITHOTOMY Left 08/03/2021   Procedure: NEPHROLITHOTOMY PERCUTANEOUS;  Surgeon: [name], MD;  Location: [facility];  Service: Urology;  Laterality: Left;   NEUROMA SURGERY Left 1997   PARATHYROIDECTOMY Right 02/27/2019   Procedure: RIGHT INFERIOR PARATHYROIDECTOMY;  Surgeon: [name], MD;  Location: [facility];  Service: General;  Laterality: Right;     Social History:   reports that she has never smoked. She has never used smokeless tobacco. She reports that she does not drink alcohol and  does not use drugs.   Family History:  Her family history is not on file.   Allergies Allergies  Allergen Reactions   Sulfa Antibiotics Itching and Rash     Home Medications  Prior to Admission medications   Medication Sig Start Date End Date Taking? Authorizing Provider  fluticasone (FLONASE) 50 MCG/ACT nasal spray Place 2 sprays into both nostrils daily as needed for allergies. 05/03/21  Yes [provider]  gabapentin (NEURONTIN) 300 MG capsule Take 1,200 mg by mouth at bedtime.   Yes [provider]  glipiZIDE (GLUCOTROL XL) 10 MG 24 hr tablet Take 10 mg by mouth in the morning and at bedtime. 06/08/18  Yes [provider]  hydrochlorothiazide (HYDRODIURIL) 25 MG tablet Take 25 mg by mouth daily.   Yes [provider]  HYDROcodone-acetaminophen (NORCO/VICODIN) 5-325 MG tablet Take 1 tablet by mouth every 4 (four) hours as needed for moderate pain. 07/02/22 07/07/22 Yes [provider]  lisinopril (PRINIVIL,ZESTRIL) 5 MG tablet Take 5 mg by mouth every morning. 06/08/18  Yes [provider]  metFORMIN (GLUCOPHAGE-XR) 500 MG 24 hr tablet Take 500 mg by mouth in the morning and at bedtime. 06/08/18  Yes [provider]  rosuvastatin (CRESTOR) 20 MG tablet Take 20 mg by mouth at bedtime. 04/15/20  Yes [provider]  sertraline (ZOLOFT) 100 MG tablet Take 100 mg by mouth daily.   Yes [provider]  traMADol (ULTRAM) 50 MG tablet Take 1 tablet (50 mg total) by mouth every 6 (six) hours as needed. 08/04/21  Yes [name], MD  ACCU-CHEK GUIDE test strip daily. 01/29/20   [provider]  alfuzosin (UROXATRAL) 10 MG 24 hr tablet Take 1 tablet (10 mg total) by mouth daily with breakfast. Patient not taking: Reported on 07/06/2022 11/28/21   [name], MD  Blood Glucose Monitoring Suppl (ACCU-CHEK GUIDE ME) w/Device KIT daily. 01/29/20   [provider]  clonazePAM (KLONOPIN) 1 MG tablet Take 0.5-1 mg by mouth daily as needed for anxiety (panic attacks).    [provider]  fexofenadine (ALLEGRA) 180 MG tablet Take 180 mg by mouth at bedtime.     [provider]  Lancets Misc. (ACCU-CHEK FASTCLIX LANCET) KIT daily. 01/29/20   [provider]     Critical care time: 45 minutes    JD Geryl Rankins [facility] 07/07/2022, 1:44 AM  Please see [email or web address] for pager details.  From 7A-7P if no response, please call [phone]. After hours, please call ELink [phone].

## 2022-07-07 NOTE — Progress Notes (Signed)
Chart reviewed, seen briefly.  Resting. On cEEG, cleviprex. No seizures on initial read. No changes to plan for today.  Erskine Emery MD PCCM

## 2022-07-07 NOTE — Progress Notes (Addendum)
eLink Physician-Brief Progress Note Patient Name: Allison Oneill DOB: [birth year] MRN: [id number]   Date of Service  07/07/2022  HPI/Events of Note  72 yr old female with chronic pain presented to [facility] with altered mental status. Developed seizures.  MRI consistent with PRES.  [facility] and management.  On EEG and neurology has seen.  eICU Interventions  Chart reviewed     Intervention Category Evaluation Type: New Patient Evaluation  Mauri Brooklyn, P 07/07/2022, 4:14 AM

## 2022-07-07 NOTE — Procedures (Signed)
Patient Name: Allison Oneill  MRN: [id number]  Epilepsy Attending: Lora Havens  Referring Physician/Provider: Lorenza Chick, MD  Duration: 07/07/2022 0013 to 07/08/2022 0013  Patient history: 72yo F with seizure like activity in setting of PRES. EEG to evaluate for seizure.  Level of alertness: Awake, asleep  AEDs during EEG study: LEV  Technical aspects: This EEG study was done with scalp electrodes positioned according to the 10-20 International system of electrode placement. Electrical activity was reviewed with band pass filter of 1-'70Hz'$ , sensitivity of 7 uV/mm, display speed of 49m/sec with a '60Hz'$  notched filter applied as appropriate. EEG data were recorded continuously and digitally stored.  Video monitoring was available and reviewed as appropriate.  Description: The posterior dominant rhythm consists of 8-9 Hz activity of moderate voltage (25-35 uV) seen predominantly in posterior head regions, symmetric and reactive to eye opening and eye closing. Sleep was characterized by vertex waves, sleep spindles (12 to 14 Hz), maximal frontocentral region. Hyperventilation and photic stimulation were not performed.     IMPRESSION: This study is within normal limits. No seizures or epileptiform discharges were seen throughout the recording.  A normal interictal EEG does not exclude the diagnosis of epilepsy.  Allison Oneill

## 2022-07-07 NOTE — Progress Notes (Signed)
LTM EEG hooked up and running - no initial skin breakdown - push button tested - [facility] monitoring.  

## 2022-07-08 DIAGNOSIS — R4182 Altered mental status, unspecified: Secondary | ICD-10-CM | POA: Diagnosis not present

## 2022-07-08 DIAGNOSIS — R569 Unspecified convulsions: Secondary | ICD-10-CM | POA: Diagnosis not present

## 2022-07-08 DIAGNOSIS — I6783 Posterior reversible encephalopathy syndrome: Secondary | ICD-10-CM | POA: Diagnosis not present

## 2022-07-08 LAB — CBC
HCT: 39.7 % (ref 36.0–46.0)
Hemoglobin: 13 g/dL (ref 12.0–15.0)
MCH: 30.2 pg (ref 26.0–34.0)
MCHC: 32.7 g/dL (ref 30.0–36.0)
MCV: 92.3 fL (ref 80.0–100.0)
Platelets: 249 10*3/uL (ref 150–400)
RBC: 4.3 MIL/uL (ref 3.87–5.11)
RDW: 14.7 % (ref 11.5–15.5)
WBC: 10.5 10*3/uL (ref 4.0–10.5)
nRBC: 0 % (ref 0.0–0.2)

## 2022-07-08 LAB — GLUCOSE, CAPILLARY
Glucose-Capillary: 139 mg/dL — ABNORMAL HIGH (ref 70–99)
Glucose-Capillary: 148 mg/dL — ABNORMAL HIGH (ref 70–99)
Glucose-Capillary: 151 mg/dL — ABNORMAL HIGH (ref 70–99)
Glucose-Capillary: 161 mg/dL — ABNORMAL HIGH (ref 70–99)
Glucose-Capillary: 189 mg/dL — ABNORMAL HIGH (ref 70–99)
Glucose-Capillary: 213 mg/dL — ABNORMAL HIGH (ref 70–99)
Glucose-Capillary: 283 mg/dL — ABNORMAL HIGH (ref 70–99)

## 2022-07-08 LAB — TRIGLYCERIDES: Triglycerides: 249 mg/dL — ABNORMAL HIGH (ref <150)

## 2022-07-08 MED ORDER — AMLODIPINE BESYLATE 10 MG PO TABS: 10.0000 mg | ORAL_TABLET | Freq: Every day | ORAL | Status: DC

## 2022-07-08 MED ORDER — ROSUVASTATIN CALCIUM 20 MG PO TABS
20.0000 mg | ORAL_TABLET | Freq: Every day | ORAL | Status: DC
  Administered 2022-07-08 – 2022-07-10 (×3): 20 mg via ORAL
  Filled 2022-07-08 (×3): qty 1

## 2022-07-08 MED ORDER — LISINOPRIL 5 MG PO TABS
5.0000 mg | ORAL_TABLET | Freq: Every day | ORAL | Status: DC
  Administered 2022-07-08 – 2022-07-10 (×3): 5 mg via ORAL
  Filled 2022-07-08 (×3): qty 1

## 2022-07-08 MED ORDER — HYDROCHLOROTHIAZIDE 25 MG PO TABS
25.0000 mg | ORAL_TABLET | Freq: Every day | ORAL | Status: DC
  Administered 2022-07-08 – 2022-07-09 (×2): 25 mg via ORAL
  Filled 2022-07-08 (×2): qty 1

## 2022-07-08 NOTE — Procedures (Addendum)
Patient Name: STEFFANIE MINGLE  MRN: [id number]  Epilepsy Attending: Lora Havens  Referring Physician/Provider: Lorenza Chick, MD  Duration: 07/08/2022 0013 to 07/08/2022 1247   Patient history: 72yo F with seizure like activity in setting of PRES. EEG to evaluate for seizure.   Level of alertness: Awake, asleep   AEDs during EEG study: LEV   Technical aspects: This EEG study was done with scalp electrodes positioned according to the 10-20 International system of electrode placement. Electrical activity was reviewed with band pass filter of 1-'70Hz'$ , sensitivity of 7 uV/mm, display speed of 39m/sec with a '60Hz'$  notched filter applied as appropriate. EEG data were recorded continuously and digitally stored.  Video monitoring was available and reviewed as appropriate.   Description: The posterior dominant rhythm consists of 8-9 Hz activity of moderate voltage (25-35 uV) seen predominantly in posterior head regions, symmetric and reactive to eye opening and eye closing. Sleep was characterized by vertex waves, sleep spindles (12 to 14 Hz), maximal frontocentral region. Hyperventilation and photic stimulation were not performed.      IMPRESSION: This study is within normal limits. No seizures or epileptiform discharges were seen throughout the recording.    Najee Cowens OBarbra Sarks

## 2022-07-08 NOTE — Progress Notes (Signed)
BP> goal of 150, prn given

## 2022-07-08 NOTE — Progress Notes (Signed)
LTM EEG discontinued - no skin breakdown at unhook. [facility] notified 

## 2022-07-08 NOTE — Consult Note (Signed)
NAME:  Allison Oneill, MRN:  [id number], DOB:  1950/12/04, LOS: 2 ADMISSION DATE:  07/06/2022, CONSULTATION DATE:  2/3 REFERRING MD:  Dr. Alcario Drought, CHIEF COMPLAINT:  Possible PRES   History of Present Illness:  Patient is a 72 year old female with pertinent PMH HTN, DMT2 presents to [facility] on 2/1 with AMS.  Patient recently diagnosed with sciatica and has been taking tramadol and Norco for pain.  On 2/1 patient more somnolent and foaming at the mouth.  Taken to [facility] ED for further eval.  BP on arrival 200/100.  UDS positive for opiates.  Ethanol, ASA, Tylenol WNL.  UA unremarkable.  CT head with no acute abnormality.  Overnight patient with seizure activity and less responsive.  Given Ativan.  MRI consistent with seizures, hypoglycemia, press.  Patient transferred to [facility] 2/2 for further eval.  Upon arrival to [facility] on 2/2, SBP less than 160.  Afebrile.  Some confusion but able to protect airway.  Patient denies any headache.  No history of seizures.  Neuro consulted.  Recommends starting patient on Cleviprex and transferring to ICU.  PCCM consulted.  Pertinent  Medical History   Past Medical History:  Diagnosis Date   Anxiety    Diabetes mellitus without complication ([place])    TYPE 2   Fibromyalgia    History of kidney stones    Hypertension    Left renal stone    Neuromuscular disorder ([place])    restless leg syndrome   Neuropathy    FEET     Significant Hospital Events: Including procedures, antibiotic start and stop dates in addition to other pertinent events   2/2 patient transferred from [facility] to [facility] for further eval for possible press; started on Cleviprex; PCCM consulted 2/4 Patient off cleviprex at this time, BP more controlled, EEG negative for seizures   Interim History / Subjective:  See above  Objective   Blood pressure (!) 137/118, pulse 98, temperature 98.6 F (37 C), temperature source Oral, resp. rate (!) 21, SpO2 93 %.         Intake/Output Summary (Last 24 hours) at 07/08/2022 0736 Last data filed at 07/08/2022 0600 Gross per 24 hour  Intake 125.6 ml  Output 600 ml  Net -474.4 ml   There were no vitals filed for this visit.  Examination: General:  chronically ill, cooperative within ICU bed  HEENT: Dry pink MM  Neuro: Alert and oriented x3, disoriented to time,  CV: s1 and s2 auscultated, RRR, no m/r/gallops  PULM:  Breath sounds clear and diminished in all lung fields, no respiratory distress Extremities: Able to follow commands, and move extremities with ease, strength equal within all extremities bilaterally   Skin: Dry, no evidence of visible lesions or wounds   Resolved Hospital Problem list     Assessment & Plan:  Acute encephalopathy:  PRES Seizure UDS positive for opiates CT Angio/Head and Neck- No emergent large vessel occlusion Bihemispheric posterior predominant hypoattenuation corresponding to areas of hyperintense T2-weighted signal on the prior MRI, likely indicating PRES. MRI of Brain conducted at [facility]  EEG-currently no seizures  P:  neuro following; appreciate their assistance  Continues AEDs per neuro: kepra  Continue seizure precautions Continue frequent neuro checks Continue limit sedating meds Currently off BP within SBP goal- Cleviprex for SBP goal 130-150 per neuro Continue to implement neuroprotective measures- normothermia, euglycemia, HOB greater than 30, head in neutral alignment, normocapnia, normoxia.   Sciatica P:  Continue prn fentanyl  HTN P:  Cleviprex currently off due to patient's ranging into SBP goal  Hydralazine '10mg'$  q4hrs Labetalol '10mg'$  prn  If patient able to tolerate orals add home anti-hypertensives Home antihypertensives: lisinopril '5mg'$  PO daily  HCTZ '25mg'$  PO daily   DMT2 Hgb A1C 8.1 07/06/22  CBGs ranging from 140s-160s  P:  Will Continue to SSI and CBG monitoring Will add metformin and glipizide home medications closer to dc  Will  add carb modified diet   Elevated Triglycerides  Was on cleviprex- suspect elevated triglycerides  Trend and follow outpatient  Restart statin-will reorder home rosuvastatin '20mg'$  ,  DAPT per neuro, currently receiving lovenox    Hypokalemia CKD stage IIIa K increased from 3.2 to 3.6   P:  Continue to follow and Trend BMP / urinary output Continue to Replace electrolytes  Continue to Avoid nephrotoxic agents, ensure adequate renal perfusion  Leukocytosis WBC trending down from 11.3 to 10.5 Suspect elevated WBC was inflammatory vs infectious process P: Continue to follow and trend CBCs   Anxiety P: -continue to hold anxiety medications resume per neurology's recommendation   Best Practice (right click and "Reselect all SmartList Selections" daily)   Diet/type: Regular consistency (see orders) DVT prophylaxis: LMWH GI prophylaxis: N/A Lines: N/A Foley:  N/A Code Status:  full code Last date of multidisciplinary goals of care discussion [2/3 updated friend Gregary Signs over phone]  Labs   CBC: Recent Labs  Lab 07/06/22 2238 07/07/22 0320 07/08/22 0558  WBC 13.0* 11.3* 10.5  NEUTROABS 9.4*  --   --   HGB 13.6 12.6 13.0  HCT 41.3 38.0 39.7  MCV 91.2 91.8 92.3  PLT 193 217 249     Basic Metabolic Panel: Recent Labs  Lab 07/06/22 2238 07/07/22 0320  NA 138 137  K 3.2* 3.6  CL 104 103  CO2 19* 22  GLUCOSE 159* 159*  BUN 17 15  CREATININE 0.98 1.02*  CALCIUM 8.7* 8.5*  MG 2.0 1.9  PHOS 3.2  --     GFR: CrCl cannot be calculated (Unknown ideal weight.). Recent Labs  Lab 07/06/22 2238 07/07/22 0320 07/08/22 0558  WBC 13.0* 11.3* 10.5     Liver Function Tests: Recent Labs  Lab 07/06/22 2238  AST 44*  ALT 25  ALKPHOS 58  BILITOT 1.1  PROT 6.6  ALBUMIN 3.6    No results for input(s): "LIPASE", "AMYLASE" in the last 168 hours. Recent Labs  Lab 07/06/22 2332  AMMONIA 26     ABG No results found for: "PHART", "PCO2ART", "PO2ART", "HCO3",  "TCO2", "ACIDBASEDEF", "O2SAT"   Coagulation Profile: Recent Labs  Lab 07/06/22 2332  INR 1.1     Cardiac Enzymes: No results for input(s): "CKTOTAL", "CKMB", "CKMBINDEX", "TROPONINI" in the last 168 hours.  HbA1C: Hgb A1c MFr Bld  Date/Time Value Ref Range Status  07/06/2022 11:32 PM 8.1 (H) 4.8 - 5.6 % Final    Comment:    (NOTE) Pre diabetes:          5.7%-6.4%  Diabetes:              >6.4%  Glycemic control for   <7.0% adults with diabetes   07/14/2021 11:48 AM 7.9 (H) 4.8 - 5.6 % Final    Comment:    (NOTE) Pre diabetes:          5.7%-6.4%  Diabetes:              >6.4%  Glycemic control for   <7.0% adults with diabetes  CBG: Recent Labs  Lab 07/07/22 1201 07/07/22 1616 07/07/22 2022 07/08/22 0041 07/08/22 0422  GLUCAP 156* 136* 141* 139* 148*     Review of Systems:   Patient is encephalopathic. Therefore history has been obtained from chart review.    Past Medical History:  She,  has a past medical history of Anxiety, Diabetes mellitus without complication ([place]), Fibromyalgia, History of kidney stones, Hypertension, Left renal stone, Neuromuscular disorder ([place]), and Neuropathy.   Surgical History:   Past Surgical History:  Procedure Laterality Date   ABDOMINAL HYSTERECTOMY  2000   COMPLETE   CYSTOSCOPY N/A 08/27/2013   Procedure: CYSTOSCOPY; REMOVAL OF BLADDER CALCULUS;  Surgeon: [name], MD;  Location: [facility];  Service: Urology;  Laterality: N/A;   CYSTOSCOPY W/ URETERAL STENT PLACEMENT N/A 2012   CYSTOSCOPY W/ URETERAL STENT REMOVAL N/A 2013   IR URETERAL STENT LEFT NEW ACCESS W/O SEP NEPHROSTOMY CATH  08/07/2018   IR URETERAL STENT LEFT NEW ACCESS W/O SEP NEPHROSTOMY CATH  08/03/2021   LITHOTRIPSY  2012   NEPHROLITHOTOMY Left 08/07/2018   Procedure: NEPHROLITHOTOMY PERCUTANEOUS;  Surgeon: [name], MD;  Location: [facility];  Service: Urology;  Laterality: Left;   NEPHROLITHOTOMY Left 08/03/2021   Procedure: NEPHROLITHOTOMY  PERCUTANEOUS;  Surgeon: [name], MD;  Location[facility];  Service: Urology;  Laterality: Left;   NEUROMA SURGERY Left 1997   PARATHYROIDECTOMY Right 02/27/2019   Procedure: RIGHT INFERIOR PARATHYROIDECTOMY;  Surgeon: [name], MD;  Location[facility];  Service: General;  Laterality: Right;     Social History:   reports that she has never smoked. She has never used smokeless tobacco. She reports that she does not drink alcohol and does not use drugs.   Family History:  Her family history is not on file.   Allergies Allergies  Allergen Reactions   Sulfa Antibiotics Itching and Rash     Home Medications  Prior to Admission medications   Medication Sig Start Date End Date Taking? Authorizing Provider  fluticasone (FLONASE) 50 MCG/ACT nasal spray Place 2 sprays into both nostrils daily as needed for allergies. 05/03/21  Yes [provider]  gabapentin (NEURONTIN) 300 MG capsule Take 1,200 mg by mouth at bedtime.   Yes [provider]  glipiZIDE (GLUCOTROL XL) 10 MG 24 hr tablet Take 10 mg by mouth in the morning and at bedtime. 06/08/18  Yes [provider]  hydrochlorothiazide (HYDRODIURIL) 25 MG tablet Take 25 mg by mouth daily.   Yes [provider]  HYDROcodone-acetaminophen (NORCO/VICODIN) 5-325 MG tablet Take 1 tablet by mouth every 4 (four) hours as needed for moderate pain. 07/02/22 07/07/22 Yes [provider]  lisinopril (PRINIVIL,ZESTRIL) 5 MG tablet Take 5 mg by mouth every morning. 06/08/18  Yes [provider]  metFORMIN (GLUCOPHAGE-XR) 500 MG 24 hr tablet Take 500 mg by mouth in the morning and at bedtime. 06/08/18  Yes [provider]  rosuvastatin (CRESTOR) 20 MG tablet Take 20 mg by mouth at bedtime. 04/15/20  Yes [provider]  sertraline (ZOLOFT) 100 MG tablet Take 100 mg by mouth daily.   Yes [provider]  traMADol (ULTRAM) 50 MG tablet Take 1 tablet (50 mg total) by mouth every 6  (six) hours as needed. 08/04/21  Yes [name], MD  ACCU-CHEK GUIDE test strip daily. 01/29/20   [provider]  alfuzosin (UROXATRAL) 10 MG 24 hr tablet Take 1 tablet (10 mg total) by mouth daily with breakfast. Patient not taking: Reported on  07/06/2022 11/28/21   [name], MD  Blood Glucose Monitoring Suppl (ACCU-CHEK GUIDE ME) w/Device KIT daily. 01/29/20   [provider]  clonazePAM (KLONOPIN) 1 MG tablet Take 0.5-1 mg by mouth daily as needed for anxiety (panic attacks).    [provider]  fexofenadine (ALLEGRA) 180 MG tablet Take 180 mg by mouth at bedtime.     [provider]  Lancets Misc. (ACCU-CHEK FASTCLIX LANCET) KIT daily. 01/29/20   [provider]     CRITICAL CARE Performed by: Waldo Laine RN, BSN, S-ACNP    Total critical care time: 35 minutes  Critical care time was exclusive of separately billable procedures and treating other patients.  Critical care was necessary to treat or prevent imminent or life-threatening deterioration.  Critical care was time spent personally by me on the following activities: development of treatment plan with patient and/or surrogate as well as nursing, discussions with consultants, evaluation of patient's response to treatment, examination of patient, obtaining history from patient or surrogate, ordering and performing treatments and interventions, ordering and review of laboratory studies, ordering and review of radiographic studies, pulse oximetry and re-evaluation of patient's condition.

## 2022-07-08 NOTE — Progress Notes (Addendum)
Neurology Progress Note  Brief HPI: Patient with history of hypertension, hyperlipidemia, diabetes, left-sided sciatica, anxiety and and restless leg syndrome initially presented with altered mental status and was quite hypertensive with blood pressure being 200/100.  She had recently been seen for left-sided sciatica and had had tramadol and Norco prescribed for her.  There was concern that blood pressure was uncontrolled due to severe pain.  She was then noted to have seizure activity, and MRI brain revealed findings concerning for PRES.   Subjective: Patient appears much more alert and oriented and improved today with no seizure activity noted overnight but intermittent feelings of wooziness and dizziness.  Event button push for feeling of wooziness which concerned patient.  Blood pressure has been under control without Cleviprex.  Exam: Vitals:   07/08/22 0500 07/08/22 0800  BP: (!) 137/118 (!) 166/77  Pulse: 98 99  Resp: (!) 21 19  Temp:  98.2 F (36.8 C)  SpO2: 93% 93%   Gen: In bed, NAD Resp: non-labored breathing, no acute distress  Neuro: Mental Status: Alert and oriented to person place time and situation, able to follow one-step and two-step commands easily.  Speech is clear and fluent Cranial Nerves: Pupils equal round and reactive, extraocular movements intact, facial sensation symmetrical, face symmetrical, hearing intact to voice, phonation normal, shoulder shrug symmetrical, tongue midline Motor: Able to move all 4 extremities with good, symmetrical antigravity strength Sensory: Intact to light touch throughout Gait: Deferred  Pertinent Labs:    Latest Ref Rng & Units 07/08/2022    5:58 AM 07/07/2022    3:20 AM 07/06/2022   10:38 PM  CBC  WBC 4.0 - 10.5 K/uL 10.5  11.3  13.0   Hemoglobin 12.0 - 15.0 g/dL 13.0  12.6  13.6   Hematocrit 36.0 - 46.0 % 39.7  38.0  41.3   Platelets 150 - 400 K/uL 249  217  193        Latest Ref Rng & Units 07/07/2022    3:20 AM 07/06/2022    10:38 PM 07/14/2021   11:48 AM  BMP  Glucose 70 - 99 mg/dL 159  159  171   BUN 8 - 23 mg/dL 15  17  36   Creatinine 0.44 - 1.00 mg/dL 1.02  0.98  1.31   Sodium 135 - 145 mmol/L 137  138  140   Potassium 3.5 - 5.1 mmol/L 3.6  3.2  3.7   Chloride 98 - 111 mmol/L 103  104  105   CO2 22 - 32 mmol/L '22  19  25   '$ Calcium 8.9 - 10.3 mg/dL 8.5  8.7  9.9   RPR: Negative HIV: Negative TSH: 1.161 B12: 171 Folate: 21.2 Thiamine: Pending  Imaging Reviewed: CT head: Bihemispheric posterior hypoattenuation corresponding to areas of hyperintense T2 weighted signal on prior MRI, likely indicating press  CTA head: No LVO or hemodynamically significant stenosis  CTV head: No evidence of dural venous sinus thrombosis  EEG 2/3: Study within normal limits with no seizure activity  EEG 2/4 no seizure activity or epileptiform discharges  Assessment: 72 year old patient with the above past medical history presents with likely press in the setting of uncontrolled hypertension due to pain.  She had some seizure activity at outside hospital, but EEG here has remained negative.  Her blood pressure has been well-controlled, but she remains confused.  Vitamin B12 is noted to be low, so will supplement.  CTA and CTV were negative for vasculitis or dural venous sinus  thrombosis.  Patient has no complaints of pain, so this should not contribute to her hypertension.  Leukocytosis is solved today, mental status has greatly improved and patient is afebrile, so no indication for LP at this time.  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 daily 5) avoid tramadol, sertraline and other serotonergic agents 6) neurology will sign off but will continue  to be available for questions or concerns 7) no driving for 6 months. Has to be seizure free before she can resume driving.  [place] , MSN, AGACNP-BC [facility] See Amion for schedule and pager information 07/08/2022 9:46 AM  NEUROHOSPITALIST ADDENDUM Performed a face to face diagnostic evaluation.   I have reviewed the contents of history and physical exam as documented by PA/ARNP/Resident and agree with above documentation.  I have discussed and formulated the above plan as documented. Edits to the note have been made as needed.  Impression/Key exam findings/Plan: Likely seizure was provoked by PRES. She needs to be on Keppra '500mg'$  BID for atleast a few months. In the outpatient setting, this can be weaned off. No driving for 6 months. Has to be seizure free before she can resume driving. Full seizure precautions at the bottom of the note.  Strict blood pressure control and I advised patient and her husband to monitor her BP at home and keep a log of reading and take them to PCP to help manage BP better.  No further inpatient neurological workup needed at this time. We will signoff. Please feel free to contact us with any questions or concerns.  Donnetta Simpers, MD [facility] [phone]   If 7pm to 7am, please call on call as listed on AMION.          Seizure precautions: Per [facility] statutes, patients with seizures are not allowed to drive until they have been seizure-free for six months and cleared by a physician    Use caution when using heavy equipment or power tools. Avoid working on ladders or at heights. Take showers instead of baths. Ensure the water temperature is not too high on the home water heater. Do not go swimming alone. Do not lock yourself in a room alone (i.e. bathroom). When caring for infants or small children, sit down when holding, feeding, or changing them to minimize risk of injury to the child in the  event you have a seizure. Maintain good sleep hygiene. Avoid alcohol.    If patient has another seizure, call 911 and bring them back to the ED if: A.  The seizure lasts longer than 5 minutes.      B.  The patient doesn't wake shortly after the seizure or has new problems such as difficulty seeing, speaking or moving following the seizure C.  The patient was injured during the seizure D.  The patient has a temperature over 102 F (39C) E.  The patient vomited during the seizure and now is having trouble breathing    During the Seizure   - First, ensure adequate ventilation and place patients on the floor on their left side  Loosen clothing around the neck and ensure the airway is patent. If the patient is clenching the teeth, do not force the mouth open with  any object as this can cause severe damage - Remove all items from the surrounding that can be hazardous. The patient may be oblivious to what's happening and may not even know what he or she is doing. If the patient is confused and wandering, either gently guide him/her away and block access to outside areas - Reassure the individual and be comforting - Call 911. In most cases, the seizure ends before EMS arrives. However, there are cases when seizures may last over 3 to 5 minutes. Or the individual may have developed breathing difficulties or severe injuries. If a pregnant patient or a person with diabetes develops a seizure, it is prudent to call an ambulance. - Finally, if the patient does not regain full consciousness, then call EMS. Most patients will remain confused for about 45 to 90 minutes after a seizure, so you must use judgment in calling for help. - Avoid restraints but make sure the patient is in a bed with padded side rails - Place the individual in a lateral position with the neck slightly flexed; this will help the saliva drain from the mouth and prevent the tongue from falling backward - Remove all nearby furniture and other  hazards from the area - Provide verbal assurance as the individual is regaining consciousness - Provide the patient with privacy if possible - Call for help and start treatment as ordered by the caregiver    After the Seizure (Postictal Stage)   After a seizure, most patients experience confusion, fatigue, muscle pain and/or a headache. Thus, one should permit the individual to sleep. For the next few days, reassurance is essential. Being calm and helping reorient the person is also of importance.   Most seizures are painless and end spontaneously. Seizures are not harmful to others but can lead to complications such as stress on the lungs, brain and the heart. Individuals with prior lung problems may develop labored breathing and respiratory distress.

## 2022-07-09 DIAGNOSIS — I6783 Posterior reversible encephalopathy syndrome: Secondary | ICD-10-CM | POA: Diagnosis not present

## 2022-07-09 LAB — CBC
HCT: 38.1 % (ref 36.0–46.0)
Hemoglobin: 12.7 g/dL (ref 12.0–15.0)
MCH: 30.2 pg (ref 26.0–34.0)
MCHC: 33.3 g/dL (ref 30.0–36.0)
MCV: 90.7 fL (ref 80.0–100.0)
Platelets: 224 10*3/uL (ref 150–400)
RBC: 4.2 MIL/uL (ref 3.87–5.11)
RDW: 14.6 % (ref 11.5–15.5)
WBC: 10.1 10*3/uL (ref 4.0–10.5)
nRBC: 0 % (ref 0.0–0.2)

## 2022-07-09 LAB — BASIC METABOLIC PANEL
Anion gap: 10 (ref 5–15)
BUN: 22 mg/dL (ref 8–23)
CO2: 27 mmol/L (ref 22–32)
Calcium: 9.1 mg/dL (ref 8.9–10.3)
Chloride: 103 mmol/L (ref 98–111)
Creatinine, Ser: 1.07 mg/dL — ABNORMAL HIGH (ref 0.44–1.00)
GFR, Estimated: 55 mL/min — ABNORMAL LOW (ref >60)
Glucose, Bld: 188 mg/dL — ABNORMAL HIGH (ref 70–99)
Potassium: 3.2 mmol/L — ABNORMAL LOW (ref 3.5–5.1)
Sodium: 140 mmol/L (ref 135–145)

## 2022-07-09 LAB — GLUCOSE, CAPILLARY
Glucose-Capillary: 201 mg/dL — ABNORMAL HIGH (ref 70–99)
Glucose-Capillary: 200 mg/dL — ABNORMAL HIGH (ref 70–99)
Glucose-Capillary: 190 mg/dL — ABNORMAL HIGH (ref 70–99)
Glucose-Capillary: 239 mg/dL — ABNORMAL HIGH (ref 70–99)
Glucose-Capillary: 162 mg/dL — ABNORMAL HIGH (ref 70–99)

## 2022-07-09 MED ORDER — VITAMIN B-12 1000 MCG PO TABS
1000.0000 ug | ORAL_TABLET | Freq: Every day | ORAL | Status: DC
  Administered 2022-07-09 – 2022-07-10 (×2): 1000 ug via ORAL
  Filled 2022-07-09 (×2): qty 1

## 2022-07-09 MED ORDER — LEVETIRACETAM 500 MG PO TABS
500.0000 mg | ORAL_TABLET | Freq: Two times a day (BID) | ORAL | Status: DC
  Administered 2022-07-09 – 2022-07-10 (×3): 500 mg via ORAL
  Filled 2022-07-09 (×3): qty 1

## 2022-07-09 MED ORDER — POTASSIUM CHLORIDE CRYS ER 20 MEQ PO TBCR
20.0000 meq | EXTENDED_RELEASE_TABLET | ORAL | Status: AC
  Administered 2022-07-09 (×2): 20 meq via ORAL
  Filled 2022-07-09 (×2): qty 1

## 2022-07-09 MED ORDER — SERTRALINE HCL 100 MG PO TABS
100.0000 mg | ORAL_TABLET | Freq: Every day | ORAL | Status: DC
  Administered 2022-07-09 – 2022-07-10 (×2): 100 mg via ORAL
  Filled 2022-07-09: qty 1
  Filled 2022-07-09: qty 2

## 2022-07-09 MED ORDER — POTASSIUM CHLORIDE 10 MEQ/100ML IV SOLN
10.0000 meq | INTRAVENOUS | Status: AC
  Administered 2022-07-09 (×3): 10 meq via INTRAVENOUS
  Filled 2022-07-09 (×4): qty 100

## 2022-07-09 MED ORDER — INSULIN GLARGINE-YFGN 100 UNIT/ML ~~LOC~~ SOLN
10.0000 [IU] | Freq: Every day | SUBCUTANEOUS | Status: DC
  Administered 2022-07-09: 10 [IU] via SUBCUTANEOUS
  Filled 2022-07-09 (×3): qty 0.1

## 2022-07-09 NOTE — Inpatient Diabetes Management (Signed)
Inpatient Diabetes Program Recommendations  AACE/ADA: New Consensus Statement on Inpatient Glycemic Control (2015)  Target Ranges:  Prepandial:   less than 140 mg/dL      Peak postprandial:   less than 180 mg/dL (1-2 hours)      Critically ill patients:  140 - 180 mg/dL   Lab Results  Component Value Date   GLUCAP 200 (H) 07/09/2022   HGBA1C 8.1 (H) 07/06/2022    Review of Glycemic Control  Latest Reference Range & Units 07/08/22 19:50 07/08/22 23:46 07/09/22 03:39 07/09/22 07:16 07/09/22 11:28  Glucose-Capillary 70 - 99 mg/dL 213 (H) 161 (H) 201 (H) 200 (H) 190 (H)  (H): Data is abnormally high Diabetes history: Type 2 DM Outpatient Diabetes medications: Glipizide 10 mg BID, Metformin 500 mg BID Current orders for Inpatient glycemic control: Novolog 0-9 units Q4H  Inpatient Diabetes Program Recommendations:    Consider adding Semglee 10 units QD.  Thanks, Bronson Curb, MSN, RNC-OB Diabetes Coordinator [phone] (8a-5p)

## 2022-07-09 NOTE — Evaluation (Signed)
Speech Language Pathology Evaluation Patient Details Name: Allison Oneill MRN: [id number] DOB: [birth year] Today's Date: 07/09/2022 Time: 9323-5573 SLP Time Calculation (min) (ACUTE ONLY): 14 min  Problem List:  Patient Active Problem List   Diagnosis Date Noted   Cerebrovascular accident (CVA) ([place]) 07/07/2022   Seizure ([facility]) 07/07/2022   Seizure-like activity (Allison Oneill) 07/07/2022   PRES (posterior reversible encephalopathy syndrome) 07/06/2022   Stage 3a chronic kidney disease (CKD) ([facility]ison Oneill) 07/06/2022   DM2 (diabetes mellitus, type 2) ([facility] One[facility]) 07/06/2022   HTN (hypertension) 07/06/2022   Sciatica 07/06/2022   Renal calculus 08/03/2021   Hypercalcemia 12/30/2018   Hyperparathyroidism, primary ([place]) 12/30/2018   Kidney stone 08/07/2018   Past Medical History:  Past Medical History:  Diagnosis Date   Anxiety    Diabetes mellitus without complication ([facility])    TYPE 2   Fibromyalgia    History of kidney stones    Hypertension    Left renal stone    Neuromuscular disorder ([facility])    restless leg syndrome   Neuropathy    FEET   Past Surgical History:  Past Surgical History:  Procedure Laterality Date   ABDOMINAL HYSTERECTOMY  2000   COMPLETE   CYSTOSCOPY N/A 08/27/2013   Procedure: CYSTOSCOPY; REMOVAL OF BLADDER CALCULUS;  Surgeon: [name], MD;  Location: [facility];  Service: Urology;  Laterality: N/A;   CYSTOSCOPY W/ URETERAL STENT PLACEMENT N/A 2012   CYSTOSCOPY W/ URETERAL STENT REMOVAL N/A 2013   IR URETERAL STENT LEFT NEW ACCESS W/O SEP NEPHROSTOMY CATH  08/07/2018   IR URETERAL STENT LEFT NEW ACCESS W/O SEP NEPHROSTOMY CATH  08/03/2021   LITHOTRIPSY  2012   NEPHROLITHOTOMY Left 08/07/2018   Procedure: NEPHROLITHOTOMY PERCUTANEOUS;  Surgeon: Allison Gallo, MD;  Location: [facility];  Service: Urology;  Laterality: Left;   NEPHROLITHOTOMY Left 08/03/2021   Procedure: NEPHROLITHOTOMY PERCUTANEOUS;  Surgeon: Allison Gallo, MD;  Location: [facility];  Service: Urology;  Laterality:  Left;   NEUROMA SURGERY Left 1997   PARATHYROIDECTOMY Right 02/27/2019   Procedure: RIGHT INFERIOR PARATHYROIDECTOMY;  Surgeon: Allison Gemma, MD;  Location: [facility];  Service: General;  Laterality: Right;   HPI:  Pt is a 72 y/o female presenting on 2/2 after transfer from [facility] for further eval of possible PRES, seizure activity in setting of uncontrolled HTN due to pain. Noted UDS positive for opiates.  2/4 EEG negative. PMH includes: HTN, DM, L sided sciatica, anxiety, RLS.   Assessment / Plan / Recommendation Clinical Impression  Pt presents with mild-moderate cognitive impairments scoring a 15/30 on the SLUMS. She is conversive, follows commands and is stated she is hopeful for improvement in her cognitive abilitites and recalled that doctor said she should make a good recovery. Impairments were in working memory (difficulty with decoding), problem solving, divergent naming and paragraph recall. ST will work with pt while she in on acute care and may need home health versus assist with family for medicine management and finances based on how she progresses.    SLP Assessment  SLP Recommendation/Assessment: Patient needs continued Speech Carver Pathology Services SLP Visit Diagnosis: Cognitive communication deficit (R41.841)    Recommendations for follow up therapy are one component of a multi-disciplinary discharge planning process, led by the attending physician.  Recommendations may be updated based on patient status, additional functional criteria and insurance authorization.    Follow Up Recommendations  Home health SLP (or possibly just assist with family)    Assistance Recommended at Discharge  Intermittent Supervision/Assistance  Functional  Status Assessment Patient has had a recent decline in their functional status and demonstrates the ability to make significant improvements in function in a reasonable and predictable amount of time.  Frequency and Duration min 1  x/week  1 week      SLP Evaluation Cognition  Overall Cognitive Status: Impaired/Different from baseline Arousal/Alertness: Awake/alert Orientation Level: Oriented X4 Year: 2024 Day of Week: Correct Attention: Sustained Sustained Attention: Appears intact Memory: Impaired Memory Impairment: Retrieval deficit Awareness: Appears intact Problem Solving: Impaired Problem Solving Impairment: Verbal basic Safety/Judgment: Impaired       Comprehension  Auditory Comprehension Overall Auditory Comprehension: Appears within functional limits for tasks assessed Visual Recognition/Discrimination Discrimination: Not tested Reading Comprehension Reading Status: Not tested    Expression Expression Primary Mode of Expression: Verbal Verbal Expression Overall Verbal Expression: Appears within functional limits for tasks assessed Level of Generative/Spontaneous Verbalization: Conversation Repetition:  (NT) Naming: Impairment Divergent:  (named 9 animals in one min) Pragmatics: No impairment Written Expression Dominant Hand: Right Written Expression: Not tested   Oral / Motor  Oral Motor/Sensory Function Overall Oral Motor/Sensory Function: Within functional limits Motor Speech Overall Motor Speech: Appears within functional limits for tasks assessed Respiration: Within functional limits Phonation: Normal Resonance: Within functional limits Articulation: Within functional limitis Intelligibility: Intelligible Motor Planning: Witnin functional limits            [name] 07/09/2022, 1:04 PM

## 2022-07-09 NOTE — Progress Notes (Signed)
Assisted pt to bathroom with walker.made bed up gave fresh water, pt tolerated walking to bathroom with out any distress.

## 2022-07-09 NOTE — Plan of Care (Signed)
  Problem: Clinical Measurements: Goal: Will remain free from infection Outcome: Progressing Goal: Respiratory complications will improve Outcome: Progressing   Problem: Pain Managment: Goal: General experience of comfort will improve Outcome: Progressing   Problem: Education: Goal: Ability to describe self-care measures that may prevent or decrease complications (Diabetes Survival Skills Education) will improve Outcome: Progressing Goal: Individualized Educational Video(s) Outcome: Progressing   Problem: Coping: Goal: Ability to adjust to condition or change in health will improve Outcome: Progressing   Problem: Fluid Volume: Goal: Ability to maintain a balanced intake and output will improve Outcome: Progressing   Problem: Health Behavior/Discharge Planning: Goal: Ability to identify and utilize available resources and services will improve Outcome: Progressing Goal: Ability to manage health-related needs will improve Outcome: Progressing   Problem: Metabolic: Goal: Ability to maintain appropriate glucose levels will improve Outcome: Progressing   Problem: Nutritional: Goal: Maintenance of adequate nutrition will improve Outcome: Progressing Goal: Progress toward achieving an optimal weight will improve Outcome: Progressing   Problem: Skin Integrity: Goal: Risk for impaired skin integrity will decrease Outcome: Progressing   Problem: Tissue Perfusion: Goal: Adequacy of tissue perfusion will improve Outcome: Progressing   Problem: Education: Goal: Expressions of having a comfortable level of knowledge regarding the disease process will increase Outcome: Progressing   Problem: Coping: Goal: Ability to adjust to condition or change in health will improve Outcome: Progressing Goal: Ability to identify appropriate support needs will improve Outcome: Progressing   Problem: Health Behavior/Discharge Planning: Goal: Compliance with prescribed medication regimen  will improve Outcome: Progressing   Problem: Medication: Goal: Risk for medication side effects will decrease Outcome: Progressing   Problem: Clinical Measurements: Goal: Complications related to the disease process, condition or treatment will be avoided or minimized Outcome: Progressing Goal: Diagnostic test results will improve Outcome: Progressing   Problem: Safety: Goal: Verbalization of understanding the information provided will improve Outcome: Progressing   Problem: Self-Concept: Goal: Level of anxiety will decrease Outcome: Progressing Goal: Ability to verbalize feelings about condition will improve Outcome: Progressing

## 2022-07-09 NOTE — Evaluation (Signed)
Physical Therapy Evaluation Patient Details Name: Allison Oneill MRN: [id number] DOB: [birth year] Today's Date: 07/09/2022  History of Present Illness  Pt is a 72 y/o female presenting on 2/2 after transfer from [facility] for further eval of possible PRES, seizure activity in setting of uncontrolled HTN due to pain. Noted UDS positive for opiates.  2/4 EEG negative. PMH includes: HTN, DM, L sided sciatica, anxiety, RLS.  Clinical Impression  Pt admitted with/for PRES, seizure activity due to uncontrolled HTN.  Pt is likely not at her baseline, needing minimal assist for help with focus on task and balance with mobility/gait.  Pt currently limited functionally due to the problems listed below.  (see problems list.)  Pt will benefit from PT to maximize function and safety to be able to get home safely with available assist .         Recommendations for follow up therapy are one component of a multi-disciplinary discharge planning process, led by the attending physician.  Recommendations may be updated based on patient status, additional functional criteria and insurance authorization.  Follow Up Recommendations Home health PT (progress to OPPT if needs more complex work)      Assistance Recommended at Discharge Intermittent Supervision/Assistance  Patient can return home with the following  A little help with walking and/or transfers;A little help with bathing/dressing/bathroom;Assistance with cooking/housework;Assist for transportation;Help with stairs or ramp for entrance    Equipment Recommendations Rolling walker (2 wheels)  Recommendations for Other Services       Functional Status Assessment Patient has had a recent decline in their functional status and demonstrates the ability to make significant improvements in function in a reasonable and predictable amount of time.     Precautions / Restrictions Precautions Precautions: Fall Precaution Comments: watch BP goal SBp 130-150,  DBP 80-100 Restrictions Weight Bearing Restrictions: No      Mobility  Bed Mobility Overal bed mobility: Needs Assistance Bed Mobility: Supine to Sit     Supine to sit: Supervision     General bed mobility comments: no physical assist required    Transfers Overall transfer level: Needs assistance Equipment used: None Transfers: Sit to/from Stand Sit to Stand: Min guard           General transfer comment: for safety/balance, preference to UE support once standing    Ambulation/Gait Ambulation/Gait assistance: Min assist Gait Distance (Feet): 150 Feet Assistive device: Rolling walker (2 wheels), None Gait Pattern/deviations: Step-through pattern   Gait velocity interpretation: 1.31 - 2.62 ft/sec, indicative of limited community ambulator   General Gait Details: slow short, unsteady gait with drift and soft stagger with divided attention or scanning with  a RW.  Pt able to increase gait speed to cuing, but does not maintain unless redirected to keeping higher speed.  Events described above with RW, gait degrades with HHA or no assist.  Stairs            Wheelchair Mobility    Modified Rankin (Stroke Patients Only)       Balance Overall balance assessment: Needs assistance Sitting-balance support: No upper extremity supported, Feet supported Sitting balance-Leahy Scale: Fair     Standing balance support: No upper extremity supported, During functional activity, Single extremity supported, Bilateral upper extremity supported Standing balance-Leahy Scale: Fair Standing balance comment: preference to UE support                             Pertinent Vitals/Pain Pain  Assessment Pain Assessment: No/denies pain    Home Living Family/patient expects to be discharged to:: Private residence Living Arrangements: Spouse/significant other Available Help at Discharge: Family Type of Home: House Home Access: Level entry       Home Layout: One  level Home Equipment: Cane - single point;Standard Environmental consultant      Prior Function Prior Level of Function : Independent/Modified Independent;Driving             Mobility Comments: independent ADLs Comments: independent ADLs, IADLs, driving     Hand Dominance   Dominant Hand: Right    Extremity/Trunk Assessment   Upper Extremity Assessment Upper Extremity Assessment: Generalized weakness    Lower Extremity Assessment Lower Extremity Assessment: Generalized weakness       Communication   Communication: No difficulties  Cognition Arousal/Alertness: Awake/alert Behavior During Therapy: Flat affect Overall Cognitive Status: Impaired/Different from baseline Area of Impairment: Attention, Memory, Following commands, Safety/judgement, Problem solving, Awareness                   Current Attention Level: Sustained Memory: Decreased short-term memory, Decreased recall of precautions Following Commands: Follows one step commands consistently, Follows one step commands with increased time, Follows multi-step commands inconsistently Safety/Judgement: Decreased awareness of safety, Decreased awareness of deficits Awareness: Emergent Problem Solving: Slow processing, Decreased initiation, Difficulty sequencing, Requires verbal cues General Comments: pt oriented, demonstrating decreased safety awareness and probelm sovling functionally.  Short blessed test reveals deficts in STM, attention and sequencing scoring 14/28 (signficant impairments).        General Comments General comments (skin integrity, edema, etc.): VSS    Exercises     Assessment/Plan    PT Assessment Patient needs continued PT services  PT Problem List Decreased strength;Decreased balance;Decreased activity tolerance;Decreased mobility;Decreased knowledge of use of DME       PT Treatment Interventions DME instruction;Gait training;Functional mobility training;Therapeutic activities;Balance  training;Patient/family education    PT Goals (Current goals can be found in the Care Plan section)  Acute Rehab PT Goals Patient Stated Goal: be able to keep my house clean PT Goal Formulation: With patient Time For Goal Achievement: 07/23/22 Potential to Achieve Goals: Good    Frequency Min 3X/week     Co-evaluation               AM-PAC PT "6 Clicks" Mobility  Outcome Measure Help needed turning from your back to your side while in a flat bed without using bedrails?: A Little Help needed moving from lying on your back to sitting on the side of a flat bed without using bedrails?: A Little Help needed moving to and from a bed to a chair (including a wheelchair)?: A Little Help needed standing up from a chair using your arms (e.g., wheelchair or bedside chair)?: A Little Help needed to walk in hospital room?: A Little Help needed climbing 3-5 steps with a railing? : A Little 6 Click Score: 18    End of Session   Activity Tolerance: Patient tolerated treatment well;Patient limited by fatigue Patient left: in chair Nurse Communication: Mobility status PT Visit Diagnosis: Unsteadiness on feet (R26.81)    Time: 1016-1040 PT Time Calculation (min) (ACUTE ONLY): 24 min   Charges:   PT Evaluation $PT Eval Moderate Complexity: 1 Mod PT Treatments $Gait Training: 8-22 mins        07/09/2022  Ginger Carne., PT Acute Rehabilitation Services [phone]  (office)  Tessie Fass Itsel Opfer 07/09/2022, 12:34 PM

## 2022-07-09 NOTE — Progress Notes (Signed)
[facility] ADULT ICU REPLACEMENT PROTOCOL   The patient does apply for the [facility] Adult ICU Electrolyte Replacment Protocol based on the criteria listed below:   1.Exclusion criteria: TCTS, ECMO, Dialysis, and Myasthenia Gravis patients 2. Is GFR >/= 30 ml/min? Yes.    Patient's GFR today is 55 3. Is SCr </= 2? Yes.   Patient's SCr is 1.07 mg/dL 4. Did SCr increase >/= 0.5 in 24 hours? Yes.   5.Pt's weight >40kg  Yes.   6. Abnormal electrolyte(s): K3.2  7. Electrolytes replaced per protocol 8.  Call MD STAT for K+ </= 2.5, Phos </= 1, or Mag </= 1 Physician:  Dr. Burnard Leigh  Carlisle Beers 07/09/2022 6:19 AM

## 2022-07-09 NOTE — Progress Notes (Signed)
NAME:  Allison Oneill, MRN:  [id number], DOB:  [birth year], LOS: 3 ADMISSION DATE:  07/06/2022, CONSULTATION DATE:  07/07/2022 REFERRING MD:  Alcario Drought - [facility] CHIEF COMPLAINT:  Possible PRES   History of Present Illness:  72 year old woman who presented to [facility] 2/1 with AMS. PMHx significant for HTN, T2DM c/b peripheral neuropathy, RLS, fibromyalgia, anxiety.  Patient recently diagnosed with sciatica and has been taking tramadol and Norco for pain.  On 2/1 patient more somnolent and foaming at the mouth.  Taken to [facility] ED for further eval.  BP on arrival 200/100.  UDS positive for opiates.  Ethanol, ASA, Tylenol WNL.  UA unremarkable.  CT Head with NAICA. Overnight, patient with seizure activity and less responsive.  Given Ativan. MRI consistent with seizures, hypoglycemia, PRES. Patient transferred to [facility] 2/2 for further eval.  Upon arrival to [facility] on 2/2, SBP less than 160.  Afebrile.  Some confusion but able to protect airway.  Patient denies any headache.  No history of seizures.  Neuro consulted.  Recommends starting patient on Cleviprex and transferring to ICU.    PCCM consulted for ICU admission and management.  Pertinent  Medical History   Past Medical History:  Diagnosis Date   Anxiety    Diabetes mellitus without complication ([place])    TYPE 2   Fibromyalgia    History of kidney stones    Hypertension    Left renal stone    Neuromuscular disorder ([place])    restless leg syndrome   Neuropathy    FEET   Significant Hospital Events: Including procedures, antibiotic start and stop dates in addition to other pertinent events   2/2 Patient transferred from [facility] to [facility] for further eval for possible PRES; started on Cleviprex; PCCM consulted 2/4 Patient off Cleviprex at this time, BP more controlled, EEG negative for seizures 2/5 Remains off of BP gtts, tolerating PO. PT/OT consulted  Interim History / Subjective:  No significant events overnight Feeling  well overall, eager to go home Tolerating PO, eating breakfast on morning rounds BP within acceptable range off of gtt PT/OT consulted for evaluation Stable for transfer out of ICU  Objective   Blood pressure 127/76, pulse 85, temperature 98.4 F (36.9 C), temperature source Oral, resp. rate 19, SpO2 95 %.        Intake/Output Summary (Last 24 hours) at 07/09/2022 0758 Last data filed at 07/09/2022 0500 Gross per 24 hour  Intake 300 ml  Output --  Net 300 ml    There were no vitals filed for this visit.  Physical Examination: General: Overall well-appearing elderly woman in NAD, slightly disheveled. Pleasant and conversant. HEENT: [place]/AT, anicteric sclera, PERRL, moist mucous membranes. Neuro: Awake, oriented x 4. Responds to verbal stimuli. Following commands consistently. Moves all 4 extremities spontaneously. Generalized weakness. CV: RRR, no m/g/r. PULM: Breathing even and unlabored on RA. Lung fields CTAB. GI: Soft, nontender, nondistended. Normoactive bowel sounds. Extremities: No significant LE edema noted. Skin: Warm/dry, no rashes.  Resolved Hospital Problem List:   Leukocytosis  Assessment & Plan:  Acute encephalopathy  PRES Seizure UDS positive for opiates, CT Angio/Head and Neck negative for LVO. Bihemispheric posterior predominant hypoattenuation corresponding to areas of hyperintense T2-weighted signal on the prior MRI, likely indicating PRES. MRI of Brain completed at [facility]. - Neuro signed off 2/4, appreciate assistance - AEDs per Neuro recs (Keppra, transition to PO 2/5) - EEG negative for seizures/epileptiform discharges - Seizure precautions - Limit sedating medications - SBP  goal 130-150 on current regimen (Lisinopril, HCTZ) - Neuroprotective measures: HOB > 30 degrees, normoglycemia, normothermia, electrolytes WNL - Stable for transfer out of ICU 2/5 - Needs PT/OT evaluation  Sciatica - Resume home Norco as appropriate, discontinue  Fentanyl - Resume gabapentin as appropriate  HTN - Remains off of Cleviprex gtt - SBP goal 130-150 - Continue Lisinopril '5mg'$ , HCTZ '25mg'$  - Titrate up as needed to maintain SBP goal, discontinuing PRNs today - Monitor BP to finalize discharge regimen  T2DM Hgb A1C 8.1 07/06/22  - SSI - CBGs Q4H - Goal CBG 140-180 - Resume home metformin, glipizide when PO more consistent  Elevated Triglycerides  Was on cleviprex- suspect elevated triglycerides. - Trend lipid panel/TG - Continue statin  Hypokalemia CKD stage IIIa - Trend BMP - Replete electrolytes as indicated, K repleted today 2/5 - Monitor I&Os - Avoid nephrotoxic agents as able - Ensure adequate renal perfusion  Anxiety - Resume home Zoloft - Klonopine PRN (home medication)  Stable for transfer to floor, nearing readiness for discharge pending BP management and PT/OT evaluation. Will sign out to [facility] for 2/6.  Best Practice: (right click and "Reselect all SmartList Selections" daily)   Diet/type: Regular consistency (see orders) DVT prophylaxis: LMWH GI prophylaxis: N/A Lines: N/A Foley:  N/A Code Status:  full code Last date of multidisciplinary goals of care discussion: [2/3 updated friend Gregary Signs over phone]  Critical care time: N/A   Rhae Lerner [facility] 07/09/22 7:58 AM  Please see [email or web address] for pager details.  From 7A-7P if no response, please call [phone] After hours, please call ELink [phone]

## 2022-07-09 NOTE — Evaluation (Addendum)
Occupational Therapy Evaluation Patient Details Name: Allison Oneill MRN: [id number] DOB: 07/09/1950 Today's Date: 07/09/2022   History of Present Illness Pt is a 72 y/o female presenting on 2/2 after transfer from [facility] for further eval of possible PRES, seizure activity in setting of uncontrolled HTN due to pain. Noted UDS positive for opiates.  2/4 EEG negative. PMH includes: HTN, DM, L sided sciatica, anxiety, RLS.   Clinical Impression   PTA patient reports independent and driving. Admitted for above and presents with problem list below.  She is alert and oriented, follows simple commands but demonstrates poor attention, recall, safety awareness, and problem solving.  Scored 14/28 on short blessed test, significant deficits.  She completes bed mobility with supervision, transfers with min guard to min assist, and requires up to min assist for ADLs.  Improved stability with UE support, recommend using RW at dc. At this time recommend 24/7 support, and assist for ALL IADLS including medication management, finances, cooking, and driving.  Pt reports she has good 24/7 support from spouse. BP stable throughout session. Based on performance today, believe she will best benefit from continued OT services acutely and after dc at [facility] level to optimize independence, safety and return to PLOF.       Recommendations for follow up therapy are one component of a multi-disciplinary discharge planning process, led by the attending physician.  Recommendations may be updated based on patient status, additional functional criteria and insurance authorization.   Follow Up Recommendations  Home health OT (progress to outpatient)     Assistance Recommended at Discharge Frequent or constant Supervision/Assistance  Patient can return home with the following A little help with walking and/or transfers;A little help with bathing/dressing/bathroom;Assistance with cooking/housework;Direct supervision/assist  for medications management;Direct supervision/assist for financial management;Assist for transportation;Help with stairs or ramp for entrance    Functional Status Assessment  Patient has had a recent decline in their functional status and demonstrates the ability to make significant improvements in function in a reasonable and predictable amount of time.  Equipment Recommendations  BSC/3in1;Other (comment) (RW)    Recommendations for Other Services       Precautions / Restrictions Precautions Precautions: Fall Precaution Comments: watch BP goal SBp 130-150, DBP 80-100 Restrictions Weight Bearing Restrictions: No      Mobility Bed Mobility Overal bed mobility: Needs Assistance Bed Mobility: Supine to Sit     Supine to sit: Supervision     General bed mobility comments: no physical assist required    Transfers Overall transfer level: Needs assistance Equipment used: None Transfers: Sit to/from Stand Sit to Stand: Min guard           General transfer comment: for safety/balance, preference to UE support once standing      Balance Overall balance assessment: Needs assistance Sitting-balance support: No upper extremity supported, Feet supported Sitting balance-Leahy Scale: Fair     Standing balance support: No upper extremity supported, During functional activity, Single extremity supported, Bilateral upper extremity supported Standing balance-Leahy Scale: Fair Standing balance comment: preference to UE support                           ADL either performed or assessed with clinical judgement   ADL Overall ADL's : Needs assistance/impaired     Grooming: Min guard;Minimal assistance;Wash/dry hands;Standing           Upper Body Dressing : Set up;Sitting   Lower Body Dressing: Minimal assistance;Sit to/from stand  Toilet Transfer: Minimal assistance;Ambulation;Min guard;Rolling walker (2 wheels) Toilet Transfer Details (indicate cue type and  reason): HHA into bathroom min assist, min gaurd with RW exiting Toileting- Clothing Manipulation and Hygiene: Minimal assistance;Sit to/from stand       Functional mobility during ADLs: Minimal assistance;Min guard;Rolling walker (2 wheels);Cueing for safety General ADL Comments: pt limited by impaired cognition, balance     Vision   Vision Assessment?: No apparent visual deficits Additional Comments: not formally assessed, but pt able to locate needs in room without cueing     Perception     Praxis      Pertinent Vitals/Pain Pain Assessment Pain Assessment: No/denies pain     Hand Dominance Right   Extremity/Trunk Assessment Upper Extremity Assessment Upper Extremity Assessment: Generalized weakness   Lower Extremity Assessment Lower Extremity Assessment: Defer to PT evaluation       Communication Communication Communication: No difficulties   Cognition Arousal/Alertness: Awake/alert Behavior During Therapy: Flat affect Overall Cognitive Status: Impaired/Different from baseline Area of Impairment: Attention, Memory, Following commands, Safety/judgement, Problem solving, Awareness                   Current Attention Level: Sustained Memory: Decreased short-term memory, Decreased recall of precautions Following Commands: Follows one step commands consistently, Follows one step commands with increased time, Follows multi-step commands inconsistently Safety/Judgement: Decreased awareness of safety, Decreased awareness of deficits Awareness: Emergent Problem Solving: Slow processing, Decreased initiation, Difficulty sequencing, Requires verbal cues General Comments: pt oriented, demonstrating decreased safety awareness and probelm sovling functionally.  Short blessed test reveals deficts in STM, attention and sequencing scoring 14/28 (signficant impairments).     General Comments  VSS during session    Exercises     Shoulder Instructions      Home  Living Family/patient expects to be discharged to:: Private residence Living Arrangements: Spouse/significant other Available Help at Discharge: Family Type of Home: House Home Access: Level entry     Home Layout: One level     Bathroom Shower/Tub: Teacher, early years/pre: Standard     Home Equipment: Cane - single point;Standard Environmental consultant          Prior Functioning/Environment Prior Level of Function : Independent/Modified Independent;Driving             Mobility Comments: independent ADLs Comments: independent ADLs, IADLs, driving        OT Problem List: Decreased strength;Decreased activity tolerance;Impaired balance (sitting and/or standing);Decreased knowledge of precautions;Decreased knowledge of use of DME or AE;Decreased cognition;Decreased safety awareness      OT Treatment/Interventions: Self-care/ADL training;Therapeutic exercise;DME and/or AE instruction;Therapeutic activities;Balance training;Patient/family education;Cognitive remediation/compensation    OT Goals(Current goals can be found in the care plan section) Acute Rehab OT Goals Patient Stated Goal: home OT Goal Formulation: With patient Time For Goal Achievement: 07/23/22 Potential to Achieve Goals: Good  OT Frequency: Min 2X/week    Co-evaluation              AM-PAC OT "6 Clicks" Daily Activity     Outcome Measure Help from another person eating meals?: A Little Help from another person taking care of personal grooming?: A Little Help from another person toileting, which includes using toliet, bedpan, or urinal?: A Little Help from another person bathing (including washing, rinsing, drying)?: A Little Help from another person to put on and taking off regular upper body clothing?: A Little Help from another person to put on and taking off regular lower body clothing?: A Little 6  Click Score: 18   End of Session Equipment Utilized During Treatment: Gait belt;Rolling walker (2  wheels) Nurse Communication: Mobility status  Activity Tolerance: Patient tolerated treatment well Patient left: in chair;with call bell/phone within reach;with chair alarm set (with PT)  OT Visit Diagnosis: Other abnormalities of gait and mobility (R26.89);Muscle weakness (generalized) (M62.81);Other symptoms and signs involving cognitive function                Time: 1001-1025 OT Time Calculation (min): 24 min Charges:  OT General Charges $OT Visit: 1 Visit OT Evaluation $OT Eval Moderate Complexity: 1 Mod  Jolaine Artist, OT Acute Rehabilitation Services Office [phone]   Delight Stare 07/09/2022, 10:41 AM

## 2022-07-10 DIAGNOSIS — R5381 Other malaise: Secondary | ICD-10-CM | POA: Insufficient documentation

## 2022-07-10 DIAGNOSIS — I6783 Posterior reversible encephalopathy syndrome: Secondary | ICD-10-CM | POA: Diagnosis not present

## 2022-07-10 LAB — MAGNESIUM: Magnesium: 1.8 mg/dL (ref 1.7–2.4)

## 2022-07-10 LAB — BASIC METABOLIC PANEL
Anion gap: 13 (ref 5–15)
BUN: 21 mg/dL (ref 8–23)
CO2: 22 mmol/L (ref 22–32)
Calcium: 9.4 mg/dL (ref 8.9–10.3)
Chloride: 105 mmol/L (ref 98–111)
Creatinine, Ser: 0.9 mg/dL (ref 0.44–1.00)
GFR, Estimated: >60 mL/min (ref >60)
Glucose, Bld: 170 mg/dL — ABNORMAL HIGH (ref 70–99)
Potassium: 3.6 mmol/L (ref 3.5–5.1)
Sodium: 140 mmol/L (ref 135–145)

## 2022-07-10 LAB — GLUCOSE, CAPILLARY
Glucose-Capillary: 166 mg/dL — ABNORMAL HIGH (ref 70–99)
Glucose-Capillary: 168 mg/dL — ABNORMAL HIGH (ref 70–99)
Glucose-Capillary: 170 mg/dL — ABNORMAL HIGH (ref 70–99)
Glucose-Capillary: 178 mg/dL — ABNORMAL HIGH (ref 70–99)

## 2022-07-10 LAB — BRAIN NATRIURETIC PEPTIDE: B Natriuretic Peptide: 25.9 pg/mL (ref 0.0–100.0)

## 2022-07-10 MED ORDER — LEVETIRACETAM 500 MG PO TABS: 500.0000 mg | ORAL_TABLET | Freq: Two times a day (BID) | ORAL | 5 refills | Status: DC

## 2022-07-10 MED ORDER — AMLODIPINE BESYLATE 10 MG PO TABS: 10.0000 mg | ORAL_TABLET | Freq: Every day | ORAL | 5 refills | Status: DC

## 2022-07-10 MED ORDER — AMLODIPINE BESYLATE 10 MG PO TABS
10.0000 mg | ORAL_TABLET | Freq: Every day | ORAL | Status: DC
  Administered 2022-07-10: 10 mg via ORAL
  Filled 2022-07-10: qty 1

## 2022-07-10 MED ORDER — ACETAMINOPHEN 325 MG PO TABS: 650.0000 mg | ORAL_TABLET | Freq: Four times a day (QID) | ORAL | Status: DC | PRN

## 2022-07-10 NOTE — Progress Notes (Signed)
Physical Therapy Treatment Patient Details Name: Allison Oneill MRN: [id number] DOB: 09/24/1950 Today's Date: 07/10/2022   History of Present Illness Pt is a 72 y/o female presenting on 2/2 after transfer from [facility] for further eval of possible PRES, seizure activity in setting of uncontrolled HTN due to pain. Noted UDS positive for opiates.  2/4 EEG negative. PMH includes: HTN, DM, L sided sciatica, anxiety, RLS.    PT Comments    Continuing work on functional mobility and activity tolerance;  Very nice progress, and nursing reports she is consistently walking well with RW; Small losses of balance today noted, but overall doing well with RW; OK for dc home from PT standpoint, with husband assist;  Needs continuing therapy follow up for gait and balance, as well as cognition; Recommend Outpt OT, SLP, PT  Recommendations for follow up therapy are one component of a multi-disciplinary discharge planning process, led by the attending physician.  Recommendations may be updated based on patient status, additional functional criteria and insurance authorization.  Follow Up Recommendations  Outpatient PT (as well as Outpt OT and Outpt SLP)  If transportation is a hardship, can go with [facility] therapies   Assistance Recommended at Discharge Intermittent Supervision/Assistance  Patient can return home with the following A little help with walking and/or transfers;A little help with bathing/dressing/bathroom;Assistance with cooking/housework;Assist for transportation;Help with stairs or ramp for entrance   Equipment Recommendations  Rolling walker (2 wheels)    Recommendations for Other Services       Precautions / Restrictions Precautions Precautions: Fall Precaution Comments: watch BP goal SBp 130-150, DBP 80-100 Restrictions Weight Bearing Restrictions: No     Mobility  Bed Mobility Overal bed mobility: Modified Independent Bed Mobility: Supine to Sit     Supine to sit:  Modified independent (Device/Increase time)     General bed mobility comments: slightly slow, but no overt difficulty    Transfers Overall transfer level: Needs assistance Equipment used: Rolling walker (2 wheels) Transfers: Sit to/from Stand Sit to Stand: Supervision           General transfer comment: Appropriately asks for RW to steady self at initail stand    Ambulation/Gait Ambulation/Gait assistance: Min guard (with and without physical contact) Gait Distance (Feet): 300 Feet Assistive device: Rolling walker (2 wheels), None Gait Pattern/deviations: Step-through pattern       General Gait Details: Good use of RW for supoprt and stability; noting occasional small loss of balance posteriorly (with turns to respond to people in hallway) -- did not need physical assist to regain balance, but this PT stepped closer due to risk   Stairs         General stair comments: pt does not have steps to enter her home, or her son's home (who she visits a lot)   Wheelchair Mobility    Modified Rankin (Stroke Patients Only)       Balance     Sitting balance-Leahy Scale: Good       Standing balance-Leahy Scale: Fair Standing balance comment: preference to UE support                            Cognition Arousal/Alertness: Awake/alert Behavior During Therapy: WFL for tasks assessed/performed Overall Cognitive Status: Impaired/Different from baseline                       Memory: Decreased short-term memory, Decreased recall of precautions  General Comments: Discussed approximating home, and established she gets OOB on the R side; when time to get up, this PT cued pt to get up on the R, and pt got up on the L side of the bed        Exercises      General Comments General comments (skin integrity, edema, etc.): HR in 120s throughout walk      Pertinent Vitals/Pain Pain Assessment Pain Assessment: Faces Faces Pain Scale: No  hurt    Home Living                          Prior Function            PT Goals (current goals can now be found in the care plan section) Acute Rehab PT Goals Patient Stated Goal: Hopes to get home today; wants to see her cat, Callie PT Goal Formulation: With patient Time For Goal Achievement: 07/23/22 Potential to Achieve Goals: Good Progress towards PT goals: Progressing toward goals    Frequency    Min 3X/week      PT Plan Discharge plan needs to be updated    Co-evaluation              AM-PAC PT "6 Clicks" Mobility   Outcome Measure  Help needed turning from your back to your side while in a flat bed without using bedrails?: None Help needed moving from lying on your back to sitting on the side of a flat bed without using bedrails?: None Help needed moving to and from a bed to a chair (including a wheelchair)?: None Help needed standing up from a chair using your arms (e.g., wheelchair or bedside chair)?: A Little Help needed to walk in hospital room?: A Little Help needed climbing 3-5 steps with a railing? : A Little 6 Click Score: 21    End of Session Equipment Utilized During Treatment: Gait belt Activity Tolerance: Patient tolerated treatment well Patient left: in bed;with call bell/phone within reach Nurse Communication: Mobility status PT Visit Diagnosis: Unsteadiness on feet (R26.81)     Time: 7209-4709 PT Time Calculation (min) (ACUTE ONLY): 20 min  Charges:  $Gait Training: 8-22 mins                     Roney Marion, [place] [phone]    Colletta [place] 07/10/2022, 11:08 AM

## 2022-07-10 NOTE — Discharge Summary (Addendum)
Physician Discharge Summary       Patient ID: Allison Oneill MRN: [id number] DOB/AGE: 1951/02/12 72 y.o.  Admission Date: 07/06/2022 Discharge Date: 07/10/2022  Discharge Diagnoses:  Principal Problem:   PRES (posterior reversible encephalopathy syndrome) Active Problems:   Stage 3a chronic kidney disease (CKD) (HCC)   DM2 (diabetes mellitus, type 2) (HCC)   HTN (hypertension)   Sciatica   Cerebrovascular accident (CVA) (HCC)   Seizure ([place])   Seizure-like activity (HCC)   Physical deconditioning  History of Present Illness: 72 year old woman who presented to [facility] 2/1 with AMS. PMHx significant for HTN, T2DM c/b peripheral neuropathy, RLS, fibromyalgia, anxiety.  Hospital Course:  Allison Oneill presented to [facility] 2/1 with AMS. PMHx significant for HTN, T2DM c/b peripheral neuropathy, RLS, fibromyalgia, anxiety.   Patient recently diagnosed with sciatica and has been taking tramadol and Norco for pain.  On 2/1 patient more somnolent and foaming at the mouth.  Taken to [facility] ED for further eval.  BP on arrival 200/100.  UDS positive for opiates.  Ethanol, ASA, Tylenol WNL.  UA unremarkable.  CT Head with NAICA. Overnight, patient with seizure activity and less responsive.  Given Ativan. MRI consistent with seizures, hypoglycemia, PRES. Patient transferred to [facility] 2/2 for further eval.   Upon arrival to [facility] on 2/2, SBP less than 160.  Afebrile.  Some confusion but able to protect airway.  Patient denies any headache.  No history of seizures.  Neuro consulted.  Recommended starting patient on Cleviprex and transferring to ICU. PCCM consulted for ICU admission and management 2/2. On 2/3, CTA Head/Neck were completed and negative for LVO, consistent with PRES. CT Venogram was negative for dural venous sinus thrombosis. On 2/4, EEG was negative for further seizures and patient was able to be weaned off of Cleviprex and remained off of antihypertensive gtts. Patient  remained hemodynamically stable with good BP control and was transferred out of ICU to the floor on 2/5. Neurology signed off and follow up was requested with plan for continued Keppra for at least a few months (per Neuro) as well as strict BP monitoring.  Deemed medically stable for discharge on 2/6. Prior to discharge, home health was established for PT/OT/SLP and a rolling walker was provided for home.   Discharge Plan by Active Problems:  Acute encephalopathy  PRES Seizure UDS positive for opiates, CT Angio/Head and Neck negative for LVO. Bihemispheric posterior predominant hypoattenuation corresponding to areas of hyperintense T2-weighted signal on the prior MRI, likely indicating PRES. MRI of Brain completed at [facility]. EEG negative for seizures/epileptiform discharges. - Neuro signed off 2/4, appreciate assistance - AEDs per Neuro (Keppra '500mg'$  BID PO), continue for a few months - Seizure precautions - Tight BP control with Lisinopril, Norvasc; goal SBP <160 - Outpatient Neuro follow up arranged, recommend also f/u with PCP - HHPT, HHOT, SLP   Sciatica - Resume home Tylenol, gabapentin - May need to resume home Norco for pain control   HTN - Needs tight BP control with goal SBP < 160 - Amlodipine, Lisinopril continued at discharge - HCTZ discontinued - Frequent BP checks at home TID  T2DM Hgb A1C 8.1 07/06/22  - Resume metformin, glipizide at discharge - ACHS glucose checks - Goal CBG 140-180   Elevated Triglycerides  Was on cleviprex- suspect elevated triglycerides. - Monitor lipids as outpatient - Continue statin  Hypokalemia, resolved CKD stage IIIa - Trend BMP as outpatient - Avoid nephrotoxic agents as able - Ensure adequate renal  perfusion   Anxiety - Continue home Zoloft - Continue home [facility] Tests/Studies:   CTA Head/Neck 2/3 IMPRESSION: 1. No emergent large vessel occlusion or hemodynamically significant stenosis of the  head or neck. 2. Bihemispheric posterior predominant hypoattenuation corresponding to areas of hyperintense T2-weighted signal on the prior MRI, likely indicating PRES.  CT Venogram Head 2/3 IMPRESSION: No evidence of dural venous sinus thrombosis.  Consults: PCCM, Neuro  Discharge Exam: BP (!) 145/75 (BP Location: Left Arm)   Pulse 100   Temp 98.9 F (37.2 C) (Oral)   Resp 17   SpO2 96%   General: Overall well-appearing elderly woman in NAD. Sitting at EOB eating lunch. Husband at bedside. HEENT: [place]/AT, anicteric sclera, PERRL, moist mucous membranes. Neuro: Awake, oriented x 4. Responds to verbal stimuli. Following commands consistently. Moves all 4 extremities spontaneously. Strength 4/5 in all 4 extremities.  CV: RRR, no m/g/r. PULM: Breathing even and unlabored on RA. Lung fields CTAB. GI: Soft, nontender, nondistended. Normoactive bowel sounds. Extremities: Trace symmetric BLE edema noted. Skin: Warm/dry, no rashes.  Labs at Discharge: Lab Results  Component Value Date   CREATININE 0.90 07/10/2022   BUN 21 07/10/2022   NA 140 07/10/2022   K 3.6 07/10/2022   CL 105 07/10/2022   CO2 22 07/10/2022   Lab Results  Component Value Date   WBC 10.1 07/09/2022   HGB 12.7 07/09/2022   HCT 38.1 07/09/2022   MCV 90.7 07/09/2022   PLT 224 07/09/2022   Lab Results  Component Value Date   ALT 25 07/06/2022   AST 44 (H) 07/06/2022   ALKPHOS 58 07/06/2022   BILITOT 1.1 07/06/2022   Lab Results  Component Value Date   INR 1.1 07/06/2022   INR 0.9 08/03/2021   INR 0.9 08/07/2018   Current Radiology Studies: No results found.  Disposition:  Discharge disposition: 01-Home or Self Care      Discharge Instructions     Ambulatory referral to Home Health   Complete by: As directed    Please evaluate Allison Oneill for admission to [facility].  Disciplines requested: Physical Therapy, Occupational Therapy, and Speech Language Pathology  Services to provide:  Evaluate  Physician to follow patient's care (the person listed here will be responsible for signing ongoing orders): PCP  Requested Start of Care Date: Within 2-3 days  I certify that this patient is under my care and that I, or a Nurse Practitioner or Physician's Assistant working with me, had a face-to-face encounter that meets the physician face-to-face requirements with patient on 2/6. The encounter with the patient was in whole, or in part for the following medical condition(s) which is the primary reason for home health care (List medical condition). Physical deconditioning, seizure activity secondary to PRES  Special Instructions:  Patient would benefit from skilled PT/OT/SLP therapies, please evaluate and treat.   Does the patient have Medicare or Medicaid?: Yes   The encounter with the patient was in whole, or in part, for the following medical condition, which is the primary reason for home health care: Physical deconditioning in the setting of PRES with seizure activity   Reason for Medically Necessary Home Health Services:  Therapy- Therapeutic Exercises to Increase Strength and Endurance Therapy- Skilled Evaluation of Speech Comprehension and Safe Swallowing Therapy- Personnel officer, Training and development officer and Stair Training Therapy- Instruction on Safe use of Assistive Devices for ADLs Therapy- Instruction on use of Assistive Device for Ambulation on all Surfaces  My clinical findings support the need for the above services:  Unsafe ambulation due to balance issues Cognitive impairments, dementia, or mental confusion  that make it unsafe to leave home Unable to leave home safely without assistance and/or assistive device     I certify that, based on my findings, the following services are medically necessary home health services:  Physical therapy Speech language pathology     Further, I certify that my clinical findings support that this patient is homebound due to:  Unsafe  ambulation due to balance issues Mental confusion     Call MD for:  difficulty breathing, headache or visual disturbances   Complete by: As directed    Call MD for:  extreme fatigue   Complete by: As directed    Call MD for:  persistant dizziness or light-headedness   Complete by: As directed    Call MD for:  persistant nausea and vomiting   Complete by: As directed    Call MD for:  redness, tenderness, or signs of infection (pain, swelling, redness, odor or green/yellow discharge around incision site)   Complete by: As directed    Call MD for:  severe uncontrolled pain   Complete by: As directed    Call MD for:  temperature >100.4   Complete by: As directed    Diet - low sodium heart healthy   Complete by: As directed    Diet Carb Modified   Complete by: As directed    Discharge instructions   Complete by: As directed    Please review your After Visit Summary for medication changes. Your new medications were sent to [facility] pickup (amlodipine, Keppra). Your previous medications were continued at discharge. Stop taking your hydrochlorothiazide. You will have outpatient follow up with Neurology, please continue your Keppra until this time. Home Health and a rolling walker will be set up for you.   Increase activity slowly   Complete by: As directed    Walker rolling   Complete by: As directed       Allergies as of 07/10/2022       Reactions   Sulfa Antibiotics Itching, Rash        Medication List     STOP taking these medications    hydrochlorothiazide 25 MG tablet Commonly known as: HYDRODIURIL   HYDROcodone-acetaminophen 5-325 MG tablet Commonly known as: NORCO/VICODIN       TAKE these medications    Accu-Chek [employer] Kit daily.   Accu-Chek Guide Me w/Device Kit daily.   Accu-Chek Guide test strip Generic drug: glucose blood daily.   acetaminophen 325 MG tablet Commonly known as: TYLENOL Take 2 tablets (650 mg total) by mouth every 6  (six) hours as needed for mild pain (or Fever >/= 101).   amLODipine 10 MG tablet Commonly known as: NORVASC Take 1 tablet (10 mg total) by mouth daily. Start taking on: July 11, 2022   clonazePAM 1 MG tablet Commonly known as: KLONOPIN Take 0.5-1 mg by mouth daily as needed for anxiety (panic attacks).   fexofenadine 180 MG tablet Commonly known as: ALLEGRA Take 180 mg by mouth at bedtime.   fluticasone 50 MCG/ACT nasal spray Commonly known as: FLONASE Place 2 sprays into both nostrils daily as needed for allergies.   gabapentin 300 MG capsule Commonly known as: NEURONTIN Take 900 mg by mouth at bedtime.   glipiZIDE 10 MG 24 hr tablet Commonly known as: GLUCOTROL XL Take 10 mg by mouth in the morning  and at bedtime.   levETIRAcetam 500 MG tablet Commonly known as: KEPPRA Take 1 tablet (500 mg total) by mouth 2 (two) times daily.   lisinopril 5 MG tablet Commonly known as: ZESTRIL Take 5 mg by mouth every morning.   metFORMIN 500 MG 24 hr tablet Commonly known as: GLUCOPHAGE-XR Take 500 mg by mouth in the morning and at bedtime.   rosuvastatin 20 MG tablet Commonly known as: CRESTOR Take 20 mg by mouth at bedtime.   sertraline 100 MG tablet Commonly known as: ZOLOFT Take 100 mg by mouth daily.   traMADol 50 MG tablet Commonly known as: Ultram Take 1 tablet (50 mg total) by mouth every 6 (six) hours as needed.        Follow-up Information     Burdine, Virgina Evener, MD. Schedule an appointment as soon as possible for a visit in 1 week(s).   Specialty: Family Medicine Contact information: [place] [address] [phone]         [facility]. Schedule an appointment as soon as possible for a visit in 2 week(s).   Contact information: [address]     [place] [address] [phone]        [facility] Follow up.   Specialty: Home Health Services Why: Alvis Lemmings will be  providing you with SN/PT/OT and Speech language Home Health   They will be contacting you within a few days of DC.  Please call them if you haven't heard from them within 48 hours of DC. Contact information: [place] [address] [phone]         [employer] Follow up.   Why: Rolling Walker provided by [employer] information: [address]  [address] [place], [place]  [address]  [phone]               Discharged Condition: good  49 minutes of time have been dedicated to discharge assessment, planning and discharge instructions.   Signed:  Rhae Lerner [facility] 07/10/22 1:24 PM  Please see [email or web address] for pager details.  From 7A-7P if no response, please call [phone] After hours, please call ELink [phone]

## 2022-07-10 NOTE — Progress Notes (Signed)
Unable to print out AVS, stating "discharge reconciliation is not complete." Reached out to Dr. Candiss Norse.

## 2022-07-10 NOTE — TOC Transition Note (Signed)
Transition of Care [facility]) - CM/SW Discharge Note   Patient Details  Name: Allison Oneill MRN: [id number] Date of Birth: 04/09/1951  Transition of [facility]) CM/SW Contact:  Levonne Lapping, RN Phone Number: 07/10/2022, 11:31 AM   Clinical Narrative:    CM met with Patient and her Husband, bedside to discuss DC plans. Transportation to and from Outpatient therapies will be a hardship so Home Health is being ordered and recommended for SN ( diabetes management) PT/OT and SLP also ordered. A rolling walker has been ordered from [employer] and will be delivered to the room prior to DC. Husband to transport home. Patient can leave after rolling walker is delivered to room           Barriers to Discharge: No Barriers Identified   Patient Goals and CMS Choice      Discharge Placement                         Discharge Plan and Services Additional resources added to the After Visit Summary for                                       Social Determinants of Health (SDOH) Interventions SDOH Screenings   Tobacco Use: Low Risk  (07/06/2022)     Readmission Risk Interventions     No data to display

## 2022-07-10 NOTE — Progress Notes (Signed)
Mobility Specialist Progress Note   07/10/22 1206  Mobility  Activity Ambulated with assistance in hallway  Level of Assistance Contact guard assist, steadying assist  Assistive Device Front wheel walker  Distance Ambulated (ft) 320 ft  Activity Response Tolerated well  Mobility Referral Yes  $Mobility charge 1 Mobility   Pre Mobility: 112 HR During Mobility: 132 HR Post Mobility: 110 HR  Agreeable to mobility. Requiring multiple cues d/t decreased spatial awareness and hitting multiple items w/ RW. No incidents w/ hallway ambulation, returned back to EOB w/ call bell in reach and bed alarm on.  Holland Falling Mobility Specialist Please contact via SecureChat or  Rehab office at [phone]

## 2022-07-10 NOTE — Discharge Instructions (Signed)
Do not drive, operate heavy machinery, perform activities at heights, swimming or participation in water activities or provide baby sitting services until you have seen by Primary MD or a Neurologist and advised to do so again.  Follow with Primary MD Curlene Labrum, MD in 7 days   Get CBC, CMP, 2 view Chest X ray -  checked next visit with your primary MD    Activity: As tolerated with Full fall precautions use walker/cane & assistance as needed  Disposition Home    Diet: Heart Healthy - Low Carb, check CBGs QAC-HS  Special Instructions: If you have smoked or chewed Tobacco  in the last 2 yrs please stop smoking, stop any regular Alcohol  and or any Recreational drug use.  On your next visit with your primary care physician please Get Medicines reviewed and adjusted.  Please request your Prim.MD to go over all Hospital Tests and Procedure/Radiological results at the follow up, please get all Hospital records sent to your Prim MD by signing hospital release before you go home.  If you experience worsening of your admission symptoms, develop shortness of breath, life threatening emergency, suicidal or homicidal thoughts you must seek medical attention immediately by calling 911 or calling your MD immediately  if symptoms less severe.  You Must read complete instructions/literature along with all the possible adverse reactions/side effects for all the Medicines you take and that have been prescribed to you. Take any new Medicines after you have completely understood and accpet all the possible adverse reactions/side effects.

## 2022-07-10 NOTE — Progress Notes (Signed)
Discharge instructions given. Patient verbalized understanding and all questions were answered.  

## 2022-07-10 NOTE — Care Management Important Message (Signed)
Important Message  Patient Details  Name: Allison Oneill MRN: [id number] Date of Birth: Oct 02, 1950   Medicare Important Message Given:  Yes     Sang Blount [address]ine Circle 07/10/2022, 3:09 PM

## 2022-07-11 LAB — VITAMIN B1: Vitamin B1 (Thiamine): 123.1 nmol/L (ref 66.5–200.0)

## 2022-07-24 DIAGNOSIS — L570 Actinic keratosis: Secondary | ICD-10-CM | POA: Diagnosis not present

## 2022-07-24 DIAGNOSIS — D0462 Carcinoma in situ of skin of left upper limb, including shoulder: Secondary | ICD-10-CM | POA: Diagnosis not present

## 2022-07-24 DIAGNOSIS — D485 Neoplasm of uncertain behavior of skin: Secondary | ICD-10-CM | POA: Diagnosis not present

## 2022-07-24 DIAGNOSIS — C44612 Basal cell carcinoma of skin of right upper limb, including shoulder: Secondary | ICD-10-CM | POA: Diagnosis not present

## 2022-07-24 DIAGNOSIS — L821 Other seborrheic keratosis: Secondary | ICD-10-CM | POA: Diagnosis not present

## 2022-07-24 DIAGNOSIS — Z85828 Personal history of other malignant neoplasm of skin: Secondary | ICD-10-CM | POA: Diagnosis not present

## 2022-07-24 DIAGNOSIS — C44519 Basal cell carcinoma of skin of other part of trunk: Secondary | ICD-10-CM | POA: Diagnosis not present

## 2022-08-08 ENCOUNTER — Ambulatory Visit: Payer: Medicare Other | Admitting: Neurology

## 2022-08-09 DIAGNOSIS — E7849 Other hyperlipidemia: Secondary | ICD-10-CM | POA: Diagnosis not present

## 2022-08-09 DIAGNOSIS — E1122 Type 2 diabetes mellitus with diabetic chronic kidney disease: Secondary | ICD-10-CM | POA: Diagnosis not present

## 2022-08-09 DIAGNOSIS — I10 Essential (primary) hypertension: Secondary | ICD-10-CM | POA: Diagnosis not present

## 2022-08-09 DIAGNOSIS — C44612 Basal cell carcinoma of skin of right upper limb, including shoulder: Secondary | ICD-10-CM | POA: Diagnosis not present

## 2022-08-09 DIAGNOSIS — R569 Unspecified convulsions: Secondary | ICD-10-CM | POA: Diagnosis not present

## 2022-08-09 DIAGNOSIS — N1831 Chronic kidney disease, stage 3a: Secondary | ICD-10-CM | POA: Diagnosis not present

## 2022-08-09 DIAGNOSIS — G619 Inflammatory polyneuropathy, unspecified: Secondary | ICD-10-CM | POA: Diagnosis not present

## 2022-08-09 DIAGNOSIS — E1140 Type 2 diabetes mellitus with diabetic neuropathy, unspecified: Secondary | ICD-10-CM | POA: Diagnosis not present

## 2022-08-09 DIAGNOSIS — I6783 Posterior reversible encephalopathy syndrome: Secondary | ICD-10-CM | POA: Diagnosis not present

## 2022-08-09 DIAGNOSIS — M5430 Sciatica, unspecified side: Secondary | ICD-10-CM | POA: Diagnosis not present

## 2022-08-09 DIAGNOSIS — R5381 Other malaise: Secondary | ICD-10-CM | POA: Diagnosis not present

## 2022-08-13 DIAGNOSIS — E039 Hypothyroidism, unspecified: Secondary | ICD-10-CM | POA: Diagnosis not present

## 2022-08-13 DIAGNOSIS — E1165 Type 2 diabetes mellitus with hyperglycemia: Secondary | ICD-10-CM | POA: Diagnosis not present

## 2022-08-13 DIAGNOSIS — E1140 Type 2 diabetes mellitus with diabetic neuropathy, unspecified: Secondary | ICD-10-CM | POA: Diagnosis not present

## 2022-08-13 DIAGNOSIS — N1831 Chronic kidney disease, stage 3a: Secondary | ICD-10-CM | POA: Diagnosis not present

## 2022-08-13 DIAGNOSIS — Z0001 Encounter for general adult medical examination with abnormal findings: Secondary | ICD-10-CM | POA: Diagnosis not present

## 2022-08-13 DIAGNOSIS — E782 Mixed hyperlipidemia: Secondary | ICD-10-CM | POA: Diagnosis not present

## 2022-08-16 DIAGNOSIS — E7849 Other hyperlipidemia: Secondary | ICD-10-CM | POA: Diagnosis not present

## 2022-08-16 DIAGNOSIS — M5430 Sciatica, unspecified side: Secondary | ICD-10-CM | POA: Diagnosis not present

## 2022-08-16 DIAGNOSIS — E1122 Type 2 diabetes mellitus with diabetic chronic kidney disease: Secondary | ICD-10-CM | POA: Diagnosis not present

## 2022-08-16 DIAGNOSIS — I10 Essential (primary) hypertension: Secondary | ICD-10-CM | POA: Diagnosis not present

## 2022-08-16 DIAGNOSIS — N1831 Chronic kidney disease, stage 3a: Secondary | ICD-10-CM | POA: Diagnosis not present

## 2022-08-16 DIAGNOSIS — G619 Inflammatory polyneuropathy, unspecified: Secondary | ICD-10-CM | POA: Diagnosis not present

## 2022-08-16 DIAGNOSIS — I6783 Posterior reversible encephalopathy syndrome: Secondary | ICD-10-CM | POA: Diagnosis not present

## 2022-08-16 DIAGNOSIS — E1140 Type 2 diabetes mellitus with diabetic neuropathy, unspecified: Secondary | ICD-10-CM | POA: Diagnosis not present

## 2022-08-20 DIAGNOSIS — H35361 Drusen (degenerative) of macula, right eye: Secondary | ICD-10-CM | POA: Diagnosis not present

## 2022-08-23 DIAGNOSIS — C44629 Squamous cell carcinoma of skin of left upper limb, including shoulder: Secondary | ICD-10-CM | POA: Diagnosis not present

## 2022-08-23 DIAGNOSIS — C44519 Basal cell carcinoma of skin of other part of trunk: Secondary | ICD-10-CM | POA: Diagnosis not present

## 2022-08-30 DIAGNOSIS — R600 Localized edema: Secondary | ICD-10-CM | POA: Diagnosis not present

## 2022-08-30 DIAGNOSIS — N1831 Chronic kidney disease, stage 3a: Secondary | ICD-10-CM | POA: Diagnosis not present

## 2022-08-30 DIAGNOSIS — E1140 Type 2 diabetes mellitus with diabetic neuropathy, unspecified: Secondary | ICD-10-CM | POA: Diagnosis not present

## 2022-08-30 DIAGNOSIS — G619 Inflammatory polyneuropathy, unspecified: Secondary | ICD-10-CM | POA: Diagnosis not present

## 2022-08-30 DIAGNOSIS — R030 Elevated blood-pressure reading, without diagnosis of hypertension: Secondary | ICD-10-CM | POA: Diagnosis not present

## 2022-08-30 DIAGNOSIS — E1122 Type 2 diabetes mellitus with diabetic chronic kidney disease: Secondary | ICD-10-CM | POA: Diagnosis not present

## 2022-09-03 ENCOUNTER — Encounter: Payer: Self-pay | Admitting: Neurology

## 2022-09-03 ENCOUNTER — Ambulatory Visit: Payer: Medicare Other | Admitting: Neurology

## 2022-09-03 VITALS — BP 133/65 | HR 71 | Ht 65.0 in | Wt 165.0 lb

## 2022-09-03 DIAGNOSIS — E538 Deficiency of other specified B group vitamins: Secondary | ICD-10-CM

## 2022-09-03 DIAGNOSIS — R569 Unspecified convulsions: Secondary | ICD-10-CM

## 2022-09-03 DIAGNOSIS — I6783 Posterior reversible encephalopathy syndrome: Secondary | ICD-10-CM

## 2022-09-03 MED ORDER — CYANOCOBALAMIN 1000 MCG PO CAPS: 1000.0000 mg | ORAL_CAPSULE | Freq: Every day | ORAL | 2 refills | Status: DC

## 2022-09-03 NOTE — Patient Instructions (Addendum)
Continue with Keppra for one more month then discontinue Follow up with PCP  Driving restriction for a total of 6 months  Return as needed

## 2022-09-03 NOTE — Progress Notes (Signed)
[facility]  PATIENT: Allison Oneill DOB: 22-Jul-1950  REQUESTING CLINICIAN: Curlene Labrum, MD HISTORY FROM: Patient  REASON FOR VISIT: PRES/Seizure follow up    HISTORICAL  CHIEF COMPLAINT:  Chief Complaint  Patient presents with   New Patient (Initial Visit)    Rm 13,  States she is stable, no new seizures.     HISTORY OF PRESENT ILLNESS:  This is a 72 year old woman past medical history of hypertension, hyperlipidemia, diabetes, depression, fibromyalgia who is presenting after being admitted to the hospital for PRES.   Patient remembers going to the hospital on February 1 for lumbar spine MRI, she said after the MRI she does not remember much.  She does not remember how she got transferred from [facility] to [facility].   Per chart review, on February 1 patient was very somnolent and foaming at the mouth.  Because of her pain she was on tramadol and Norco.  In the hospital she was noted to be hypertensive to the [id number] systolic.  She did have a seizure overnight and transferred to [facility].  She did also have an MRI brain which showed evidence of pres.  Her long-term EEG monitoring did not show any history of clinical seizures.  She was treated with Keppra 500 mg, currently on Keppra 500 mg twice daily.  Denies any side effect from the medicine and denies any seizure.  Prior to hospitalization, she never had a seizure before.   Again she is doing well, she reports that she feels like she is back at baseline.  She is monitoring her blood pressure at home and it is in the normal range.  Her PCP had made some changes to her blood pressure, her hydrochlorothiazide was discontinued and patient was started on amlodipine.  Currently she is complaining of leg swelling.  She is pending a ultrasound of the bilateral lower extremities and heart in a couple weeks.  No pain, no side effect from the medicine, and no new current complaints.    Hospital Course:  [name] presented to [facility] 2/1 with AMS. PMHx significant for HTN, T2DM c/b peripheral neuropathy, RLS, fibromyalgia, anxiety.   Patient recently diagnosed with sciatica and has been taking tramadol and Norco for pain.  On 2/1 patient more somnolent and foaming at the mouth.  Taken to [facility] ED for further eval.  BP on arrival 200/100.  UDS positive for opiates.  Ethanol, ASA, Tylenol WNL.  UA unremarkable.  CT Head with NAICA. Overnight, patient with seizure activity and less responsive.  Given Ativan. MRI consistent with seizures, hypoglycemia, PRES. Patient transferred to [facility] 2/2 for further eval.   Upon arrival to [facility] on 2/2, SBP less than 160.  Afebrile.  Some confusion but able to protect airway.  Patient denies any headache.  No history of seizures.  Neuro consulted.  Recommended starting patient on Cleviprex and transferring to ICU. PCCM consulted for ICU admission and management 2/2. On 2/3, CTA Head/Neck were completed and negative for LVO, consistent with PRES. CT Venogram was negative for dural venous sinus thrombosis. On 2/4, EEG was negative for further seizures and patient was able to be weaned off of Cleviprex and remained off of antihypertensive gtts. Patient remained hemodynamically stable with good BP control and was transferred out of ICU to the floor on 2/5. Neurology signed off and follow up was requested with plan for continued Keppra for at least a few months (per Neuro) as well as strict  BP monitoring.   Deemed medically stable for discharge on 2/6. Prior to discharge, home health was established for PT/OT/SLP and a rolling walker was provided for home  OTHER MEDICAL CONDITIONS: Hypertension, hyperlipidemia, diabetes mellitus, fibromyalgia.   REVIEW OF SYSTEMS: Full 14 system review of systems performed and negative with exception of: As noted in the HPI.  ALLERGIES: Allergies  Allergen Reactions   Sulfa Antibiotics Itching and Rash    HOME  MEDICATIONS: Outpatient Medications Prior to Visit  Medication Sig Dispense Refill   ACCU-CHEK GUIDE test strip daily.     amLODipine (NORVASC) 10 MG tablet Take 1 tablet (10 mg total) by mouth daily. 30 tablet 5   Blood Glucose Monitoring Suppl (ACCU-CHEK GUIDE ME) w/Device KIT daily.     fexofenadine (ALLEGRA) 180 MG tablet Take 180 mg by mouth at bedtime.      fluticasone (FLONASE) 50 MCG/ACT nasal spray Place 2 sprays into both nostrils daily as needed for allergies.     gabapentin (NEURONTIN) 300 MG capsule Take 300 mg by mouth daily.     glipiZIDE (GLUCOTROL XL) 10 MG 24 hr tablet Take 10 mg by mouth in the morning and at bedtime.     Lancets Misc. (ACCU-CHEK FASTCLIX LANCET) KIT daily.     lisinopril (PRINIVIL,ZESTRIL) 5 MG tablet Take 5 mg by mouth every morning.     metFORMIN (GLUCOPHAGE-XR) 500 MG 24 hr tablet Take 500 mg by mouth in the morning and at bedtime.     rosuvastatin (CRESTOR) 20 MG tablet Take 20 mg by mouth at bedtime.     sertraline (ZOLOFT) 100 MG tablet Take 100 mg by mouth daily.     levETIRAcetam (KEPPRA) 500 MG tablet Take 1 tablet (500 mg total) by mouth 2 (two) times daily. 60 tablet 5   acetaminophen (TYLENOL) 325 MG tablet Take 2 tablets (650 mg total) by mouth every 6 (six) hours as needed for mild pain (or Fever >/= 101).     clonazePAM (KLONOPIN) 1 MG tablet Take 0.5-1 mg by mouth daily as needed for anxiety (panic attacks).     traMADol (ULTRAM) 50 MG tablet Take 1 tablet (50 mg total) by mouth every 6 (six) hours as needed. (Patient not taking: Reported on 07/09/2022) 10 tablet 0   No facility-administered medications prior to visit.    PAST MEDICAL HISTORY: Past Medical History:  Diagnosis Date   Anxiety    Diabetes mellitus without complication    TYPE 2   Fibromyalgia    History of kidney stones    Hypertension    Left renal stone    Neuromuscular disorder    restless leg syndrome   Neuropathy    FEET    PAST SURGICAL HISTORY: Past  Surgical History:  Procedure Laterality Date   ABDOMINAL HYSTERECTOMY  2000   COMPLETE   CYSTOSCOPY N/A 08/27/2013   Procedure: CYSTOSCOPY; REMOVAL OF BLADDER CALCULUS;  Surgeon: [name], MD;  Location: [facility];  Service: Urology;  Laterality: N/A;   CYSTOSCOPY W/ URETERAL STENT PLACEMENT N/A 2012   CYSTOSCOPY W/ URETERAL STENT REMOVAL N/A 2013   IR URETERAL STENT LEFT NEW ACCESS W/O SEP NEPHROSTOMY CATH  08/07/2018   IR URETERAL STENT LEFT NEW ACCESS W/O SEP NEPHROSTOMY CATH  08/03/2021   LITHOTRIPSY  2012   NEPHROLITHOTOMY Left 08/07/2018   Procedure: NEPHROLITHOTOMY PERCUTANEOUS;  Surgeon: [name], MD;  Location: [facility];  Service: Urology;  Laterality: Left;   NEPHROLITHOTOMY Left 08/03/2021   Procedure: NEPHROLITHOTOMY PERCUTANEOUS;  Surgeon: [name], MD;  Location: [facility];  Service: Urology;  Laterality: Left;   NEUROMA SURGERY Left 1997   PARATHYROIDECTOMY Right 02/27/2019   Procedure: RIGHT INFERIOR PARATHYROIDECTOMY;  Surgeon: [name], MD;  Location: [facility];  Service: General;  Laterality: Right;    FAMILY HISTORY: History reviewed. No pertinent family history.  SOCIAL HISTORY: Social History   Socioeconomic History   Marital status: Married    Spouse name: Not on file   Number of children: Not on file   Years of education: Not on file   Highest education level: Not on file  Occupational History   Not on file  Tobacco Use   Smoking status: Never   Smokeless tobacco: Never  Vaping Use   Vaping Use: Never used  Substance and Sexual Activity   Alcohol use: No   Drug use: No   Sexual activity: Not on file  Other Topics Concern   Not on file  Social History Narrative   Not on file   Social Determinants of Health   Financial Resource Strain: Not on file  Food Insecurity: Not on file  Transportation Needs: Not on file  Physical Activity: Not on file  Stress: Not on file  Social Connections: Not on file  Intimate Partner Violence: Not  on file    PHYSICAL EXAM  GENERAL EXAM/CONSTITUTIONAL: Vitals:  Vitals:   09/03/22 1307  BP: 133/65  Pulse: 71  Weight: 165 lb (74.8 kg)  Height: 5\' 5"  (1.651 m)   Body mass index is 27.46 kg/m. Wt Readings from Last 3 Encounters:  09/03/22 165 lb (74.8 kg)  08/22/21 165 lb (74.8 kg)  08/03/21 168 lb 10.4 oz (76.5 kg)   Patient is in no distress; well developed, nourished and groomed; neck is supple  EYES: Visual fields full to confrontation, Extraocular movements intacts,   MUSCULOSKELETAL: Gait, strength, tone, movements noted in Neurologic exam below  NEUROLOGIC: MENTAL STATUS:      No data to display         awake, alert, oriented to person, place and time recent and remote memory intact normal attention and concentration language fluent, comprehension intact, naming intact fund of knowledge appropriate  CRANIAL NERVE:  2nd, 3rd, 4th, 6th - Visual fields full to confrontation, extraocular muscles intact, no nystagmus 5th - facial sensation symmetric 7th - facial strength symmetric 8th - hearing intact 9th - palate elevates symmetrically, uvula midline 11th - shoulder shrug symmetric 12th - tongue protrusion midline  MOTOR:  normal bulk and tone, full strength in the BUE, BLE. She has non pitting edema up to mid shin.   SENSORY:  normal and symmetric to light touch  COORDINATION:  finger-nose-finger, fine finger movements normal  REFLEXES:  deep tendon reflexes present and symmetric  GAIT/STATION:  normal     DIAGNOSTIC DATA (LABS, IMAGING, TESTING) - I reviewed patient records, labs, notes, testing and imaging myself where available.  Lab Results  Component Value Date   WBC 10.1 07/09/2022   HGB 12.7 07/09/2022   HCT 38.1 07/09/2022   MCV 90.7 07/09/2022   PLT 224 07/09/2022      Component Value Date/Time   NA 140 07/10/2022 0638   K 3.6 07/10/2022 0638   CL 105 07/10/2022 0638   CO2 22 07/10/2022 0638   GLUCOSE 170 (H)  07/10/2022 0638   BUN 21 07/10/2022 0638   CREATININE 0.90 07/10/2022 0638   CREATININE 1.03 (H) 11/17/2018 1452   CALCIUM 9.4 07/10/2022 [id number]  PROT 6.6 07/06/2022 2238   ALBUMIN 3.6 07/06/2022 2238   AST 44 (H) 07/06/2022 2238   ALT 25 07/06/2022 2238   ALKPHOS 58 07/06/2022 2238   BILITOT 1.1 07/06/2022 2238   GFRNONAA >60 07/10/2022 0638   GFRNONAA 55 (L) 11/17/2018 1452   GFRAA >60 02/18/2019 1406   GFRAA 64 11/17/2018 1452   Lab Results  Component Value Date   TRIG 249 (H) 07/08/2022   Lab Results  Component Value Date   HGBA1C 8.1 (H) 07/06/2022   Lab Results  Component Value Date   VITAMINB12 171 (L) 07/06/2022   Lab Results  Component Value Date   TSH 1.161 07/06/2022    EEG 07/07/2022 This study is within normal limits. No seizures or epileptiform discharges were seen throughout the recording. A normal interictal EEG does not exclude the diagnosis of epilepsy   ASSESSMENT AND PLAN  72 y.o. year old female with hypertension, hyperlipidemia, diabetes mellitus, depression, who is presenting after being admitted to the hospital with seizure found to have placed.  Her blood pressure is normalized now.  She is currently on Keppra 500 mg twice daily.  Plan will be for patient to take Keppra 500 mg twice daily for total of 3 months then discontinued it.  She voices understanding.  We also discussed driving restriction for total of 6 months.  Again she is agreeable with plans.  Continue to follow-up with PCP, return as needed. I did advise her if her ultrasound is negative, to discuss with her PCP about restarted hydrochlorothiazide.    1. PRES (posterior reversible encephalopathy syndrome)   2. Seizures   3. B12 deficiency      Patient Instructions  Continue with Keppra for one more month then discontinue Follow up with PCP  Driving restriction for a total of 6 months  Return as needed   No orders of the defined types were placed in this encounter.   Meds  ordered this encounter  Medications   Cyanocobalamin 1000 MCG CAPS    Sig: Take 1,000 mg by mouth daily.    Dispense:  90 capsule    Refill:  2    Return if symptoms worsen or fail to improve.    Alric Ran, MD 09/03/2022, 2:21 PM  [facility] [address], [place], [place] [address] [phone]

## 2022-09-05 DIAGNOSIS — N1831 Chronic kidney disease, stage 3a: Secondary | ICD-10-CM | POA: Diagnosis not present

## 2022-09-05 DIAGNOSIS — R600 Localized edema: Secondary | ICD-10-CM | POA: Diagnosis not present

## 2022-09-05 DIAGNOSIS — E1122 Type 2 diabetes mellitus with diabetic chronic kidney disease: Secondary | ICD-10-CM | POA: Diagnosis not present

## 2022-09-05 DIAGNOSIS — I10 Essential (primary) hypertension: Secondary | ICD-10-CM | POA: Diagnosis not present

## 2022-09-05 DIAGNOSIS — I6783 Posterior reversible encephalopathy syndrome: Secondary | ICD-10-CM | POA: Diagnosis not present

## 2022-09-13 DIAGNOSIS — M79674 Pain in right toe(s): Secondary | ICD-10-CM | POA: Diagnosis not present

## 2022-09-13 DIAGNOSIS — L110 Acquired keratosis follicularis: Secondary | ICD-10-CM | POA: Diagnosis not present

## 2022-09-13 DIAGNOSIS — M79671 Pain in right foot: Secondary | ICD-10-CM | POA: Diagnosis not present

## 2022-09-13 DIAGNOSIS — M79672 Pain in left foot: Secondary | ICD-10-CM | POA: Diagnosis not present

## 2022-09-13 DIAGNOSIS — I739 Peripheral vascular disease, unspecified: Secondary | ICD-10-CM | POA: Diagnosis not present

## 2022-09-13 DIAGNOSIS — M79675 Pain in left toe(s): Secondary | ICD-10-CM | POA: Diagnosis not present

## 2022-09-13 DIAGNOSIS — E1140 Type 2 diabetes mellitus with diabetic neuropathy, unspecified: Secondary | ICD-10-CM | POA: Diagnosis not present

## 2022-09-14 DIAGNOSIS — I3481 Nonrheumatic mitral (valve) annulus calcification: Secondary | ICD-10-CM | POA: Diagnosis not present

## 2022-09-14 DIAGNOSIS — R600 Localized edema: Secondary | ICD-10-CM | POA: Diagnosis not present

## 2022-11-08 DIAGNOSIS — Z1231 Encounter for screening mammogram for malignant neoplasm of breast: Secondary | ICD-10-CM | POA: Diagnosis not present

## 2022-11-09 DIAGNOSIS — E213 Hyperparathyroidism, unspecified: Secondary | ICD-10-CM | POA: Diagnosis not present

## 2022-11-09 DIAGNOSIS — I10 Essential (primary) hypertension: Secondary | ICD-10-CM | POA: Diagnosis not present

## 2022-11-09 DIAGNOSIS — E1165 Type 2 diabetes mellitus with hyperglycemia: Secondary | ICD-10-CM | POA: Diagnosis not present

## 2022-11-09 DIAGNOSIS — E1122 Type 2 diabetes mellitus with diabetic chronic kidney disease: Secondary | ICD-10-CM | POA: Diagnosis not present

## 2022-11-09 DIAGNOSIS — E1140 Type 2 diabetes mellitus with diabetic neuropathy, unspecified: Secondary | ICD-10-CM | POA: Diagnosis not present

## 2022-11-09 DIAGNOSIS — E559 Vitamin D deficiency, unspecified: Secondary | ICD-10-CM | POA: Diagnosis not present

## 2022-11-13 DIAGNOSIS — N183 Chronic kidney disease, stage 3 unspecified: Secondary | ICD-10-CM | POA: Diagnosis not present

## 2022-11-13 DIAGNOSIS — I6783 Posterior reversible encephalopathy syndrome: Secondary | ICD-10-CM | POA: Diagnosis not present

## 2022-11-13 DIAGNOSIS — R569 Unspecified convulsions: Secondary | ICD-10-CM | POA: Diagnosis not present

## 2022-11-13 DIAGNOSIS — D126 Benign neoplasm of colon, unspecified: Secondary | ICD-10-CM | POA: Diagnosis not present

## 2022-11-13 DIAGNOSIS — N1832 Chronic kidney disease, stage 3b: Secondary | ICD-10-CM | POA: Diagnosis not present

## 2022-11-13 DIAGNOSIS — R7989 Other specified abnormal findings of blood chemistry: Secondary | ICD-10-CM | POA: Diagnosis not present

## 2022-11-13 DIAGNOSIS — E1122 Type 2 diabetes mellitus with diabetic chronic kidney disease: Secondary | ICD-10-CM | POA: Diagnosis not present

## 2022-11-13 DIAGNOSIS — I10 Essential (primary) hypertension: Secondary | ICD-10-CM | POA: Diagnosis not present

## 2022-11-14 DIAGNOSIS — R928 Other abnormal and inconclusive findings on diagnostic imaging of breast: Secondary | ICD-10-CM | POA: Diagnosis not present

## 2022-11-14 DIAGNOSIS — R92321 Mammographic fibroglandular density, right breast: Secondary | ICD-10-CM | POA: Diagnosis not present

## 2022-11-29 DIAGNOSIS — M79674 Pain in right toe(s): Secondary | ICD-10-CM | POA: Diagnosis not present

## 2022-11-29 DIAGNOSIS — E1140 Type 2 diabetes mellitus with diabetic neuropathy, unspecified: Secondary | ICD-10-CM | POA: Diagnosis not present

## 2022-11-29 DIAGNOSIS — M79675 Pain in left toe(s): Secondary | ICD-10-CM | POA: Diagnosis not present

## 2022-11-29 DIAGNOSIS — M79671 Pain in right foot: Secondary | ICD-10-CM | POA: Diagnosis not present

## 2022-11-29 DIAGNOSIS — M79672 Pain in left foot: Secondary | ICD-10-CM | POA: Diagnosis not present

## 2022-11-29 DIAGNOSIS — L110 Acquired keratosis follicularis: Secondary | ICD-10-CM | POA: Diagnosis not present

## 2022-11-29 DIAGNOSIS — I739 Peripheral vascular disease, unspecified: Secondary | ICD-10-CM | POA: Diagnosis not present

## 2022-12-11 ENCOUNTER — Other Ambulatory Visit: Payer: Self-pay

## 2022-12-11 DIAGNOSIS — N200 Calculus of kidney: Secondary | ICD-10-CM

## 2022-12-11 NOTE — Progress Notes (Signed)
Pt aware to have KUB prior to 07/19 apt

## 2022-12-20 NOTE — Progress Notes (Signed)
Name: Allison Oneill DOB: 08-Jan-1951 MRN: [id number]  History of Present Illness: Allison Oneill is a 72 y.o. female who presents today for follow up visit at [facility] [place]. - GU History: 1. Kidney stones. - 08/07/2018: Underwent left PCNL by Dr. Retta Diones. - 08/03/2021: Underwent left PCNL by Dr. Retta Diones. 2. Bladder stone. - 08/27/2013: Underwent cystolitholapaxy by Dr. Jerre Simon.  At last visit with Dr. Retta Diones on 11/28/2021: Seen for left distal ureteral stone. The plan was medical expulsive therapy with Alfuzosin.  Today: KUB today: Awaiting radiology read; appears to have a lower right ureteral stone.   She reports right flank / RLQ pain for a few weeks; worse at night. The pain is described as mild-to-moderate intensity. Denies fevers. Reports mild nausea intermittently. She denies recent episode of stone pain / passage. She reports some pink-tinged urine. Reports nocturia (1-2x/night). She denies increased urinary urgency, frequency, dysuria, hesitancy, straining to void, or sensations of incomplete emptying.   Fall Screening: Do you usually have a device to assist in your mobility? No   Medications: Current Outpatient Medications  Medication Sig Dispense Refill   alfuzosin (UROXATRAL) 10 MG 24 hr tablet Take 1 tablet (10 mg total) by mouth daily with breakfast. 30 tablet 0   ACCU-CHEK GUIDE test strip daily.     amLODipine (NORVASC) 10 MG tablet Take 1 tablet (10 mg total) by mouth daily. 30 tablet 5   Blood Glucose Monitoring Suppl (ACCU-CHEK GUIDE ME) w/Device KIT daily.     fexofenadine (ALLEGRA) 180 MG tablet Take 180 mg by mouth at bedtime.      fluticasone (FLONASE) 50 MCG/ACT nasal spray Place 2 sprays into both nostrils daily as needed for allergies.     gabapentin (NEURONTIN) 300 MG capsule Take 300 mg by mouth daily.     glipiZIDE (GLUCOTROL XL) 10 MG 24 hr tablet Take 10 mg by mouth in the morning and at bedtime.     Lancets Misc. (ACCU-CHEK  FASTCLIX LANCET) KIT daily.     lisinopril (PRINIVIL,ZESTRIL) 5 MG tablet Take 5 mg by mouth every morning.     metFORMIN (GLUCOPHAGE-XR) 500 MG 24 hr tablet Take 500 mg by mouth in the morning and at bedtime.     rosuvastatin (CRESTOR) 20 MG tablet Take 20 mg by mouth at bedtime.     sertraline (ZOLOFT) 100 MG tablet Take 100 mg by mouth daily.     No current facility-administered medications for this visit.    Allergies: Allergies  Allergen Reactions   Sulfa Antibiotics Itching and Rash    Past Medical History:  Diagnosis Date   Anxiety    Diabetes mellitus without complication (HCC)    TYPE 2   Fibromyalgia    History of kidney stones    Hypertension    Left renal stone    Neuromuscular disorder (HCC)    restless leg syndrome   Neuropathy    FEET   Past Surgical History:  Procedure Laterality Date   ABDOMINAL HYSTERECTOMY  2000   COMPLETE   CYSTOSCOPY N/A 08/27/2013   Procedure: CYSTOSCOPY; REMOVAL OF BLADDER CALCULUS;  Surgeon: Ky Barban, MD;  Location: [facility];  Service: Urology;  Laterality: N/A;   CYSTOSCOPY W/ URETERAL STENT PLACEMENT N/A 2012   CYSTOSCOPY W/ URETERAL STENT REMOVAL N/A 2013   IR URETERAL STENT LEFT NEW ACCESS W/O SEP NEPHROSTOMY CATH  08/07/2018   IR URETERAL STENT LEFT NEW ACCESS W/O SEP NEPHROSTOMY CATH  08/03/2021   LITHOTRIPSY  2012  NEPHROLITHOTOMY Left 08/07/2018   Procedure: NEPHROLITHOTOMY PERCUTANEOUS;  Surgeon: Marcine Matar, MD;  Location: [facility];  Service: Urology;  Laterality: Left;   NEPHROLITHOTOMY Left 08/03/2021   Procedure: NEPHROLITHOTOMY PERCUTANEOUS;  Surgeon: Marcine Matar, MD;  Location: [facility];  Service: Urology;  Laterality: Left;   NEUROMA SURGERY Left 1997   PARATHYROIDECTOMY Right 02/27/2019   Procedure: RIGHT INFERIOR PARATHYROIDECTOMY;  Surgeon: Darnell Level, MD;  Location: [facility];  Service: General;  Laterality: Right;   History reviewed. No pertinent family history. Social History   Socioeconomic  History   Marital status: Married    Spouse name: Not on file   Number of children: Not on file   Years of education: Not on file   Highest education level: Not on file  Occupational History   Not on file  Tobacco Use   Smoking status: Never   Smokeless tobacco: Never  Vaping Use   Vaping status: Never Used  Substance and Sexual Activity   Alcohol use: No   Drug use: No   Sexual activity: Not on file  Other Topics Concern   Not on file  Social History Narrative   Not on file   Social Determinants of Health   Financial Resource Strain: Not on file  Food Insecurity: Not on file  Transportation Needs: Not on file  Physical Activity: Not on file  Stress: Not on file  Social Connections: Not on file  Intimate Partner Violence: Not on file    SUBJECTIVE  Review of Systems Constitutional: Patient denies any unintentional weight loss or change in strength lntegumentary: Patient denies any rashes or pruritus Cardiovascular: Patient denies chest pain or syncope Respiratory: Patient denies shortness of breath Gastrointestinal: Patient denies vomiting, constipation, or diarrhea Musculoskeletal: Patient denies muscle cramps or weakness Neurologic: Patient denies convulsions or seizures Psychiatric: Patient denies memory problems Allergic/Immunologic: Patient denies recent allergic reaction(s) Hematologic/Lymphatic: Patient denies bleeding tendencies Endocrine: Patient denies heat/cold intolerance  GU: As per HPI.  OBJECTIVE Vitals:   12/21/22 0935  BP: 113/68  Pulse: 65  Temp: 98.3 F (36.8 C)   There is no height or weight on file to calculate BMI.  Physical Examination  Constitutional: No obvious distress; patient is non-toxic appearing  Cardiovascular: No visible lower extremity edema.  Respiratory: The patient does not have audible wheezing/stridor; respirations do not appear labored  Gastrointestinal: Abdomen non-distended Musculoskeletal: Normal ROM of UEs   Skin: No obvious rashes/open sores  Neurologic: CN 2-12 grossly intact Psychiatric: Answered questions appropriately with normal affect  Hematologic/Lymphatic/Immunologic: No obvious bruises or sites of spontaneous bleeding  UA: positive for 6-10 WBC/hpf, 11-30 RBC/hpf, bacteria (moderate)  ASSESSMENT Right flank pain  RLQ abdominal pain  Right ureteral stone - Plan: alfuzosin (UROXATRAL) 10 MG 24 hr tablet, DG Abd 1 View, US RENAL  Kidney stone - Plan: Urinalysis, Routine w reflex microscopic, alfuzosin (UROXATRAL) 10 MG 24 hr tablet, DG Abd 1 View, US RENAL  We reviewed today's KUB; appears to be passing a right ureteral stone.   We discussed the various treatment options including observation, medical expulsive therapy, shock wave lithotripsy, ureteroscopic stone manipulation, and percutaneous nephrolithotomy. We discussed possible risks and benefits of each option including but not limited to: including pain, infection, sepsis, UTI, ureter perforation, need for stenting, post-op ureteral stricture, hematuria. She was advised that we will notify them of our recommendations following review of their imaging results.  For acute GU stone symptoms we agreed to proceed with: - Alfuzosin daily x2 weeks for medical  expulsive therapy (MET), which may improve passage of stone(s).  - For pain management, we discussed the use of opioids versus OTC analgesics. She declined opioid prescription.  - She declined prescription for nausea management.  -  Will check urine culture and treat as indicated based on results.  She was advised to contact urology provider or go to the ER if She develops fever >101F, uncontrollable pain, or other significantly concerning symptoms.  Will plan to follow up in 2 weeks with KUB and RUS for recheck. Pt verbalized understanding and agreement. All questions were answered.  PLAN Advised the following: 1. Alfuzosin daily x2 weeks. 2. Analgesics PRN for pain. 3.  Urine culture. 4. Return in about 2 weeks (around 01/04/2023) for RUS, KUB, UA, & f/u with Evette Georges NP.   Orders Placed This Encounter  Procedures   DG Abd 1 View    Standing Status:   Future    Standing Expiration Date:   12/21/2023    Order Specific Question:   Reason for Exam (SYMPTOM  OR DIAGNOSIS REQUIRED)    Answer:   kidney stone    Order Specific Question:   Preferred imaging location?    Answer:   [facility]   US RENAL    Standing Status:   Future    Standing Expiration Date:   12/21/2023    Order Specific Question:   Reason for Exam (SYMPTOM  OR DIAGNOSIS REQUIRED)    Answer:   kidney stone known or suspected    Order Specific Question:   Preferred imaging location?    Answer:   [facility]   Urinalysis, Routine w reflex microscopic   It has been explained that the patient is to follow regularly with their PCP in addition to all other providers involved in their care and to follow instructions provided by these respective offices. Patient advised to contact urology clinic if any urologic-pertaining questions, concerns, new symptoms or problems arise in the interim period.  There are no Patient Instructions on file for this visit.  Electronically signed by:  Donnita Falls, MSN, FNP-C, CUNP 12/21/2022 10:19 AM

## 2022-12-21 ENCOUNTER — Ambulatory Visit (HOSPITAL_COMMUNITY)
Admission: RE | Admit: 2022-12-21 | Discharge: 2022-12-21 | Disposition: A | Discharge status: Home / Self Care | Payer: Medicare Other | Source: Ambulatory Visit | Attending: Urology | Admitting: Urology

## 2022-12-21 ENCOUNTER — Ambulatory Visit (INDEPENDENT_AMBULATORY_CARE_PROVIDER_SITE_OTHER): Payer: Medicare Other | Admitting: Urology

## 2022-12-21 ENCOUNTER — Encounter: Payer: Self-pay | Admitting: Urology

## 2022-12-21 ENCOUNTER — Other Ambulatory Visit: Payer: Self-pay | Admitting: Urology

## 2022-12-21 VITALS — BP 113/68 | HR 65 | Temp 98.3°F

## 2022-12-21 DIAGNOSIS — N200 Calculus of kidney: Secondary | ICD-10-CM

## 2022-12-21 DIAGNOSIS — R109 Unspecified abdominal pain: Secondary | ICD-10-CM | POA: Diagnosis not present

## 2022-12-21 DIAGNOSIS — N201 Calculus of ureter: Secondary | ICD-10-CM

## 2022-12-21 DIAGNOSIS — R1031 Right lower quadrant pain: Secondary | ICD-10-CM | POA: Diagnosis not present

## 2022-12-21 DIAGNOSIS — R8290 Unspecified abnormal findings in urine: Secondary | ICD-10-CM

## 2022-12-21 LAB — MICROSCOPIC EXAMINATION: Epithelial Cells (non renal): >10 /hpf — AB (ref 0–10)

## 2022-12-21 LAB — URINALYSIS, ROUTINE W REFLEX MICROSCOPIC
Bilirubin, UA: NEGATIVE
Glucose, UA: NEGATIVE
Ketones, UA: NEGATIVE
Nitrite, UA: NEGATIVE
Specific Gravity, UA: 1.03 (ref 1.005–1.030)
Urobilinogen, Ur: 0.2 mg/dL (ref 0.2–1.0)
pH, UA: 5.5 (ref 5.0–7.5)

## 2022-12-21 MED ORDER — ALFUZOSIN HCL ER 10 MG PO TB24: 10.0000 mg | ORAL_TABLET | Freq: Every day | ORAL | 0 refills | Status: DC

## 2022-12-23 LAB — URINE CULTURE

## 2022-12-24 NOTE — Progress Notes (Signed)
Please notify patient: Negative urine culture, no antibiotic needed at this time. Thanks.

## 2023-01-04 ENCOUNTER — Ambulatory Visit (HOSPITAL_COMMUNITY)
Admission: RE | Admit: 2023-01-04 | Discharge: 2023-01-04 | Disposition: A | Discharge status: Home / Self Care | Payer: Medicare Other | Source: Ambulatory Visit | Attending: Urology | Admitting: Urology

## 2023-01-04 DIAGNOSIS — N200 Calculus of kidney: Secondary | ICD-10-CM

## 2023-01-04 DIAGNOSIS — N201 Calculus of ureter: Secondary | ICD-10-CM | POA: Insufficient documentation

## 2023-01-04 DIAGNOSIS — N202 Calculus of kidney with calculus of ureter: Secondary | ICD-10-CM | POA: Diagnosis not present

## 2023-01-14 ENCOUNTER — Telehealth: Payer: Self-pay

## 2023-01-14 NOTE — Progress Notes (Signed)
Please let pt know KUB showed bilateral non-obstructing kidney stones; no ureteral stones seen. Follow up as planned. Thanks.

## 2023-01-14 NOTE — Telephone Encounter (Signed)
-----   Message from Donnita Falls sent at 01/14/2023  8:39 AM EDT ----- Please let pt know KUB showed bilateral non-obstructing kidney stones; no ureteral stones seen. Follow up as planned. Thanks.

## 2023-01-14 NOTE — Telephone Encounter (Signed)
Detail message left making patient aware of Sarah recommendations.

## 2023-01-17 ENCOUNTER — Ambulatory Visit: Payer: Medicare Other | Admitting: Urology

## 2023-02-07 DIAGNOSIS — E7849 Other hyperlipidemia: Secondary | ICD-10-CM | POA: Diagnosis not present

## 2023-02-07 DIAGNOSIS — I10 Essential (primary) hypertension: Secondary | ICD-10-CM | POA: Diagnosis not present

## 2023-02-07 DIAGNOSIS — N183 Chronic kidney disease, stage 3 unspecified: Secondary | ICD-10-CM | POA: Diagnosis not present

## 2023-02-07 DIAGNOSIS — E1140 Type 2 diabetes mellitus with diabetic neuropathy, unspecified: Secondary | ICD-10-CM | POA: Diagnosis not present

## 2023-02-07 DIAGNOSIS — E1165 Type 2 diabetes mellitus with hyperglycemia: Secondary | ICD-10-CM | POA: Diagnosis not present

## 2023-02-07 DIAGNOSIS — E213 Hyperparathyroidism, unspecified: Secondary | ICD-10-CM | POA: Diagnosis not present

## 2023-02-11 DIAGNOSIS — L110 Acquired keratosis follicularis: Secondary | ICD-10-CM | POA: Diagnosis not present

## 2023-02-11 DIAGNOSIS — M79672 Pain in left foot: Secondary | ICD-10-CM | POA: Diagnosis not present

## 2023-02-11 DIAGNOSIS — I739 Peripheral vascular disease, unspecified: Secondary | ICD-10-CM | POA: Diagnosis not present

## 2023-02-11 DIAGNOSIS — E1140 Type 2 diabetes mellitus with diabetic neuropathy, unspecified: Secondary | ICD-10-CM | POA: Diagnosis not present

## 2023-02-11 DIAGNOSIS — M79675 Pain in left toe(s): Secondary | ICD-10-CM | POA: Diagnosis not present

## 2023-02-11 DIAGNOSIS — M79674 Pain in right toe(s): Secondary | ICD-10-CM | POA: Diagnosis not present

## 2023-02-11 DIAGNOSIS — M79671 Pain in right foot: Secondary | ICD-10-CM | POA: Diagnosis not present

## 2023-02-14 DIAGNOSIS — N1832 Chronic kidney disease, stage 3b: Secondary | ICD-10-CM | POA: Diagnosis not present

## 2023-02-14 DIAGNOSIS — Z0001 Encounter for general adult medical examination with abnormal findings: Secondary | ICD-10-CM | POA: Diagnosis not present

## 2023-02-14 DIAGNOSIS — Z23 Encounter for immunization: Secondary | ICD-10-CM | POA: Diagnosis not present

## 2023-02-14 DIAGNOSIS — R569 Unspecified convulsions: Secondary | ICD-10-CM | POA: Diagnosis not present

## 2023-02-14 DIAGNOSIS — R7989 Other specified abnormal findings of blood chemistry: Secondary | ICD-10-CM | POA: Diagnosis not present

## 2023-02-14 DIAGNOSIS — I6783 Posterior reversible encephalopathy syndrome: Secondary | ICD-10-CM | POA: Diagnosis not present

## 2023-02-14 DIAGNOSIS — E1122 Type 2 diabetes mellitus with diabetic chronic kidney disease: Secondary | ICD-10-CM | POA: Diagnosis not present

## 2023-02-14 DIAGNOSIS — I10 Essential (primary) hypertension: Secondary | ICD-10-CM | POA: Diagnosis not present

## 2023-02-18 NOTE — Progress Notes (Unsigned)
Name: Allison Oneill DOB: Jan 20, 1951 MRN: [id number]  History of Present Illness: Allison Oneill is a 72 y.o. female who presents today for follow up visit at [facility] [place]. - GU History: 1. Kidney stones. - 08/07/2018: Underwent left PCNL by Dr. Retta Diones. - 08/03/2021: Underwent left PCNL by Dr. Retta Diones. 2. Bladder stone. - 08/27/2013: Underwent cystolitholapaxy by Dr. Jerre Simon.  At last visit on 12/21/2022: - Reported mild-to-moderate right flank / RLQ pain for a few weeks with mild nausea intermittently, nocturia x1-2, and some pink-tinged urine.  - Appeared to be passing left ureteral stone per KUB. - The plan was: 1. Alfuzosin daily x2 weeks. 2. Analgesics PRN for pain. 3. Urine culture (negative) 4. Return in about 2 weeks (around 01/04/2023) for RUS, KUB, UA, & f/u with Evette Georges NP.  Since last visit: > 01/04/2023:  - RUS showed a 9 mm stone midpole left kidney; no hydronephrosis. - KUB showed bilateral non-obstructing kidney stones; no ureteral stones seen.  Today: She denies seeing any stone pass since last visit.  She denies recent flank pain / abdominal pain or fevers.   She denies increased urinary urgency, frequency, nocturia, dysuria, gross hematuria, hesitancy, straining to void, or sensations of incomplete emptying.  She is somewhat concerned about her kidney function - she had labs at her PCP's office recently and states she was told that her kidney function "was down to the 30's". Unable to review those results in chart today.    Fall Screening: Do you usually have a device to assist in your mobility? No   Medications: Current Outpatient Medications  Medication Sig Dispense Refill   ACCU-CHEK GUIDE test strip daily.     amLODipine (NORVASC) 10 MG tablet Take 1 tablet (10 mg total) by mouth daily. 30 tablet 5   Blood Glucose Monitoring Suppl (ACCU-CHEK GUIDE ME) w/Device KIT daily.     fexofenadine (ALLEGRA) 180 MG tablet Take 180 mg by  mouth at bedtime.      fluticasone (FLONASE) 50 MCG/ACT nasal spray Place 2 sprays into both nostrils daily as needed for allergies.     gabapentin (NEURONTIN) 300 MG capsule Take 300 mg by mouth daily.     glipiZIDE (GLUCOTROL XL) 10 MG 24 hr tablet Take 10 mg by mouth in the morning and at bedtime.     Lancets Misc. (ACCU-CHEK FASTCLIX LANCET) KIT daily.     lisinopril (PRINIVIL,ZESTRIL) 5 MG tablet Take 5 mg by mouth every morning.     metFORMIN (GLUCOPHAGE-XR) 500 MG 24 hr tablet Take 500 mg by mouth in the morning and at bedtime.     rosuvastatin (CRESTOR) 20 MG tablet Take 20 mg by mouth at bedtime.     sertraline (ZOLOFT) 100 MG tablet Take 100 mg by mouth daily.     No current facility-administered medications for this visit.    Allergies: Allergies  Allergen Reactions   Alfuzosin Other (See Comments)    Made her feel dizzy and "out of it"   Sulfa Antibiotics Itching and Rash    Past Medical History:  Diagnosis Date   Anxiety    Diabetes mellitus without complication (HCC)    TYPE 2   Fibromyalgia    History of kidney stones    Hypertension    Left renal stone    Neuromuscular disorder (HCC)    restless leg syndrome   Neuropathy    FEET   Past Surgical History:  Procedure Laterality Date   ABDOMINAL HYSTERECTOMY  2000  COMPLETE   CYSTOSCOPY N/A 08/27/2013   Procedure: CYSTOSCOPY; REMOVAL OF BLADDER CALCULUS;  Surgeon: Ky Barban, MD;  Location: [facility];  Service: Urology;  Laterality: N/A;   CYSTOSCOPY W/ URETERAL STENT PLACEMENT N/A 2012   CYSTOSCOPY W/ URETERAL STENT REMOVAL N/A 2013   IR URETERAL STENT LEFT NEW ACCESS W/O SEP NEPHROSTOMY CATH  08/07/2018   IR URETERAL STENT LEFT NEW ACCESS W/O SEP NEPHROSTOMY CATH  08/03/2021   LITHOTRIPSY  2012   NEPHROLITHOTOMY Left 08/07/2018   Procedure: NEPHROLITHOTOMY PERCUTANEOUS;  Surgeon: Marcine Matar, MD;  Location: [facility];  Service: Urology;  Laterality: Left;   NEPHROLITHOTOMY Left 08/03/2021   Procedure:  NEPHROLITHOTOMY PERCUTANEOUS;  Surgeon: Marcine Matar, MD;  Location: [facility];  Service: Urology;  Laterality: Left;   NEUROMA SURGERY Left 1997   PARATHYROIDECTOMY Right 02/27/2019   Procedure: RIGHT INFERIOR PARATHYROIDECTOMY;  Surgeon: Darnell Level, MD;  Location: [facility];  Service: General;  Laterality: Right;   History reviewed. No pertinent family history. Social History   Socioeconomic History   Marital status: Married    Spouse name: Not on file   Number of children: Not on file   Years of education: Not on file   Highest education level: Not on file  Occupational History   Not on file  Tobacco Use   Smoking status: Never   Smokeless tobacco: Never  Vaping Use   Vaping status: Never Used  Substance and Sexual Activity   Alcohol use: No   Drug use: No   Sexual activity: Not on file  Other Topics Concern   Not on file  Social History Narrative   Not on file   Social Determinants of Health   Financial Resource Strain: Not on file  Food Insecurity: Not on file  Transportation Needs: Not on file  Physical Activity: Not on file  Stress: Not on file  Social Connections: Not on file  Intimate Partner Violence: Not on file    SUBJECTIVE  Review of Systems Constitutional: Patient denies any unintentional weight loss or change in strength lntegumentary: Patient denies any rashes or pruritus Cardiovascular: Patient denies chest pain or syncope Respiratory: Patient denies shortness of breath Gastrointestinal: Patient denies nausea, vomiting, constipation, or diarrhea Musculoskeletal: Patient denies muscle cramps or weakness Neurologic: Patient denies convulsions or seizures Psychiatric: Patient denies memory problems Allergic/Immunologic: Patient denies recent allergic reaction(s) Hematologic/Lymphatic: Patient denies bleeding tendencies Endocrine: Patient denies heat/cold intolerance  GU: As per HPI.  OBJECTIVE Vitals:   02/19/23 1318  BP: 139/64  Pulse: 75   Temp: 98.7 F (37.1 C)   There is no height or weight on file to calculate BMI.  Physical Examination Constitutional: No obvious distress; patient is non-toxic appearing  Cardiovascular: No visible lower extremity edema.  Respiratory: The patient does not have audible wheezing/stridor; respirations do not appear labored  Gastrointestinal: Abdomen non-distended Musculoskeletal: Normal ROM of UEs  Skin: No obvious rashes/open sores  Neurologic: CN 2-12 grossly intact Psychiatric: Answered questions appropriately with normal affect  Hematologic/Lymphatic/Immunologic: No obvious bruises or sites of spontaneous bleeding  UA: 6-10 WBC/hpf, 3-10 RBC/hpf, bacteria (few)  ASSESSMENT Kidney stone - Plan: Urinalysis, Routine w reflex microscopic, DG Abd 1 View, US RENAL, Urine Culture  We reviewed recent imaging results and discussed presence of non-obstructing left intrarenal stone. She is asymptomatic today however her UA is positive. Will check urine culture; if negative will plan to consult with MD regarding asymptomatic microscopic hematuria. Patient was advised that CT may be warranted for further  evaluation.   Requesting recent lab results from PCP (Dr. Leandrew Koyanagi) for review of renal function.  For stone prevention: Advised adequate hydration and we discussed option to consider low oxalate diet given that calcium oxalate is the most common type of stone. Handout provided about stone prevention diet.  Will plan to follow up in 6 months KUB and RUS for stone surveillance or sooner if needed. Pt verbalized understanding and agreement. All questions were answered.  PLAN Advised the following: Urine culture.  Maintain adequate fluid intake daily. Drink citrus juice (lemon, lime or orange juice) routinely. Low oxalate diet. Requesting recent metabolic panel lab results from PCP for review. Return in about 6 months (around 08/19/2023) for RUS, KUB, UA, & f/u with Evette Georges NP.  Orders  Placed This Encounter  Procedures   Urine Culture   DG Abd 1 View    Standing Status:   Future    Standing Expiration Date:   02/19/2024    Order Specific Question:   Reason for Exam (SYMPTOM  OR DIAGNOSIS REQUIRED)    Answer:   kidney stone    Order Specific Question:   Preferred imaging location?    Answer:   [facility]   US RENAL    Standing Status:   Future    Standing Expiration Date:   02/19/2024    Order Specific Question:   Reason for Exam (SYMPTOM  OR DIAGNOSIS REQUIRED)    Answer:   kidney stone known or suspected    Order Specific Question:   Preferred imaging location?    Answer:   [facility]   Urinalysis, Routine w reflex microscopic    It has been explained that the patient is to follow regularly with their PCP in addition to all other providers involved in their care and to follow instructions provided by these respective offices. Patient advised to contact urology clinic if any urologic-pertaining questions, concerns, new symptoms or problems arise in the interim period.  Patient Instructions  >80% of stones are calcium oxalate. This type of stones forms when body either isn't clearing oxalate well enough, is making too much oxalate, or too little citrate. This results in oxalate binding to form crystals, which continue to aggregate and form stones.  Limiting calcium does not help, but limiting oxalate in the diet can help. Increasing citric acid intake may also help.  The following measures may help to prevent the recurrence of stones: Increase water intake to 2-2.5 liters per day May add citrus juice (lemon, lime or orange juice) to water Moderation in dairy foods Decrease in salt content 5. Low Oxalate diet: Oxylates are found in foods like Tomato, Spinach, red wine and chocolate (see additional resources below).  Internet resources for information regarding low oxalate  diet: https://kidneystones.yangchunwu.com https://my.VerticalStretch.be  Foods Low in Sodium or Oxalate Foods You Can Eat  Drinks Coffee, fruit and veggie juice (using the recommended veggies), fruit punch  Fruits Apples, apricots (fresh or canned), avocado, bananas, cherries (sweet), cranberries, grapefruit, red or green grapes, lemon and lime juice, melons, nectarines, papayas, peaches, pears, pineapples, oranges, strawberries (fresh), tangerines  Veggies Artichokes, asparagus, bamboo shoots, broccoli, brussels sprouts, cabbage, cauliflower, chayote squash, chicory, corn, cucumbers, endive, lettuce, lima beans, mushrooms, onions, peas, peppers, potatoes, radishes, rutabagas, zucchini  Breads, Cereals, Grains Egg noodles, rye bread, cooked and dry cereals without nuts or bran, crackers with unsalted tops, white or wild rice  Meat, Meat Replacements, Fish, [employer], fish, poultry, eggs, egg whites, egg replacements  Soup Homemade soup (using the recommended veggies and meat), low-sodium bouillon, low-sodium canned  Desserts Cookies, cakes, ice cream, pudding without chocolate or nuts, candy without chocolate or nuts  Fats and Oils Butter, margarine, cream, oil, salad dressing, mayo  Other Foods Unsalted potato chips or pretzels, herbs (like garlic, garlic powder, onion powder), lemon juice, salt-free seasoning blends, vinegar  Other Foods Low in Oxalate Foods You Can Eat  Drinks Beer, cola, wine, buttermilk, lemonade or limeade (without added vitamin C), milk  Meat, Meat Replacements, Fish, [employer] meat, ham, bacon, hot dogs, bratwurst, sausage, chicken nuggets, cheddar cheese, canned fish and shellfish  Soup Tomato soup, cheese soup  Other Foods Coconuts, lemon or lime juices, sugar or sweeteners, jellies or jams (from the recommended list)   Moderate-Oxalate Foods Foods to Limit   Drinks  Fruit and veggie juices (from the list below), chocolate milk, rice milk, hot cocoa, tea   Fruits Blackberries, blueberries, black currants, cherries (sour), fruit cocktail, mangoes, orange peel, prunes, purple plums   Veggies Baked beans, carrots, celery, green beans, parsnips, summer squash, tomatoes, turnips   Breads, Cereals, Grains White bread, cornbread or cornmeal, white English muffins, saltine or soda crackers, brown rice, vanilla wafers, spaghetti and other noodles, firm tofu, bagels, oatmeal   Meat/meat replacements, fish, poultry Sardines   Desserts Chocolate cake   Fats and Oils Macadamia nuts, pistachio nuts, English walnuts   Other Foods Jams or jellies (made with the fruits above), pepper    High-Oxalate Foods Foods to Avoid Drinks Chocolate drink mixes, soy milk, Ovaltine, instant iced tea, fruit juices of fruits listed below Fruits Apricots (dried), red currants, figs, kiwi, plums, rhubarb Veggies Beans (wax, dried), beets and beet greens, chives, collard greens, eggplant, escarole, dark greens of all kinds, leeks, okra, parsley, rutabagas, spinach, Swiss chard, tomato paste, watercress Breads, Cereals, Grains Amaranth, barley, white corn flour, fried potatoes, fruitcake, grits, soybean products, sweet potatoes, wheat germ and bran, buckwheat flour, All Bran cereal, graham crackers, pretzels, whole wheat bread Meat/meat replacements, fish, poultry  Dried beans, peanut butter, soy burgers, miso  Desserts Carob, chocolate, marmalades Fats and Oils Nuts (peanuts, almonds, pecans, cashews, hazelnuts), nut butters, sesame seeds, tahini paste Other Foods Poppy seeds   Electronically signed by:  Donnita Falls, MSN, FNP-C, CUNP 02/19/2023 2:46 PM

## 2023-02-19 ENCOUNTER — Encounter: Payer: Self-pay | Admitting: Urology

## 2023-02-19 ENCOUNTER — Ambulatory Visit: Payer: Medicare Other | Admitting: Urology

## 2023-02-19 VITALS — BP 139/64 | HR 75 | Temp 98.7°F

## 2023-02-19 DIAGNOSIS — N200 Calculus of kidney: Secondary | ICD-10-CM | POA: Diagnosis not present

## 2023-02-19 LAB — MICROSCOPIC EXAMINATION: Epithelial Cells (non renal): >10 /HPF — AB (ref 0–10)

## 2023-02-19 LAB — URINALYSIS, ROUTINE W REFLEX MICROSCOPIC
Bilirubin, UA: NEGATIVE
Ketones, UA: NEGATIVE
Nitrite, UA: NEGATIVE
Specific Gravity, UA: 1.025 (ref 1.005–1.030)
Urobilinogen, Ur: 1 mg/dL (ref 0.2–1.0)
pH, UA: 6 (ref 5.0–7.5)

## 2023-02-19 NOTE — Patient Instructions (Signed)

## 2023-02-21 ENCOUNTER — Telehealth: Payer: Self-pay

## 2023-02-21 ENCOUNTER — Other Ambulatory Visit: Payer: Self-pay | Admitting: Urology

## 2023-02-21 DIAGNOSIS — N289 Disorder of kidney and ureter, unspecified: Secondary | ICD-10-CM

## 2023-02-21 LAB — URINE CULTURE

## 2023-02-21 NOTE — Telephone Encounter (Signed)
Patient is made aware via detailed voice mail.

## 2023-02-21 NOTE — Telephone Encounter (Signed)
-----   Message from Allison Oneill sent at 02/21/2023  8:44 AM EDT ----- Please notify patient:  1. Negative urine culture, no antibiotic needed at this time.  2. As we discussed at her visit: I sent a consult message to Dr. Retta Diones this morning about her microscopic hematuria, renal function, and stones. Will notify her of his recommendations once received.

## 2023-02-22 ENCOUNTER — Telehealth: Payer: Self-pay

## 2023-02-22 NOTE — Telephone Encounter (Signed)
Patient is made aware via detailed voice message left on home phone as indicated in her designated party release.

## 2023-02-22 NOTE — Telephone Encounter (Signed)
Patient is made aware via detailed voice message left on home phone as indicated in her designated party release.       "Also please let her know that Dr. Retta Diones said that if she wants to have ESWL might be worthwhile to proactively address the 9 mm left kidney stone seen on her RUS on 01/04/2023 given that she said she wants to avoid the risk for needing another PCNL in the future. "

## 2023-02-22 NOTE — Telephone Encounter (Signed)
-----   Message from Donnita Falls sent at 02/21/2023  4:14 PM EDT ----- Please let pt know that Dr. Retta Diones was consulted as discussed. He advised blood work to recheck her renal function; order placed for BMP. If that remains abnormal, will then order CT for further evaluation.

## 2023-02-26 NOTE — Telephone Encounter (Signed)
Patient would like to do the ESWL at [facility] in [place], please provide posting sheet.

## 2023-02-26 NOTE — Telephone Encounter (Signed)
Patient returned call about setting up surgery .

## 2023-02-27 ENCOUNTER — Telehealth: Payer: Self-pay

## 2023-02-27 ENCOUNTER — Other Ambulatory Visit: Payer: Medicare Other

## 2023-02-27 DIAGNOSIS — N289 Disorder of kidney and ureter, unspecified: Secondary | ICD-10-CM | POA: Diagnosis not present

## 2023-02-27 NOTE — Telephone Encounter (Signed)
-----   Message from Donnita Falls sent at 02/26/2023  2:09 PM EDT ----- 1. Please let pt know that Dr. Retta Diones advised blood work (BMP) to recheck her renal function; order placed.  - If her renal function is still abnormal, will plan for CT to further evaluate.  - If her renal function is back to normal, CT not necessary and would plan for follow up in 6 months for stone surveillance (as planned).   2. He also said that if she wants to have ESWL might be worthwhile to proactively address the 9 mm left kidney stone seen on her RUS on 01/04/2023 given that she said she wants to avoid the risk for needing another PCNL in the future. Please let me know if she wants to proceed with ESWL at this time.

## 2023-02-27 NOTE — Telephone Encounter (Signed)
Patient is made aware of Sarah and Dr. Retta Diones recommendation, patient states she would like to proceed with ESWL for the 9 mm left kidney stone.  Patient is scheduled for BMP today. Patient voiced understanding

## 2023-02-28 ENCOUNTER — Other Ambulatory Visit: Payer: Self-pay | Admitting: Urology

## 2023-02-28 DIAGNOSIS — N289 Disorder of kidney and ureter, unspecified: Secondary | ICD-10-CM

## 2023-02-28 DIAGNOSIS — N200 Calculus of kidney: Secondary | ICD-10-CM

## 2023-02-28 LAB — BASIC METABOLIC PANEL
BUN/Creatinine Ratio: 17 (ref 12–28)
BUN: 21 mg/dL (ref 8–27)
CO2: 23 mmol/L (ref 20–29)
Calcium: 10 mg/dL (ref 8.7–10.3)
Chloride: 103 mmol/L (ref 96–106)
Creatinine, Ser: 1.21 mg/dL — ABNORMAL HIGH (ref 0.57–1.00)
Glucose: 230 mg/dL — ABNORMAL HIGH (ref 70–99)
Potassium: 4.3 mmol/L (ref 3.5–5.2)
Sodium: 144 mmol/L (ref 134–144)
eGFR: 47 mL/min/{1.73_m2} — ABNORMAL LOW (ref >59)

## 2023-02-28 NOTE — Telephone Encounter (Signed)
Patient is made aware via voiced message.

## 2023-02-28 NOTE — Telephone Encounter (Signed)
-----   Message from Donnita Falls sent at 02/28/2023  9:07 AM EDT ----- Please let patient know that her renal function has improved compared to 02/10/2023 but is still reduced compared to prior. Will place order for CT for further evaluation of that and to assist with stone procedure planning. Will plan to notify her of recommendations for next steps based on imaging results. Thanks.

## 2023-02-28 NOTE — Progress Notes (Signed)
Please let patient know that her renal function has improved compared to 02/10/2023 but is still reduced compared to prior. Will place order for CT for further evaluation of that and to assist with stone procedure planning. Will plan to notify her of recommendations for next steps based on imaging results. Thanks.

## 2023-03-01 ENCOUNTER — Other Ambulatory Visit: Payer: Self-pay

## 2023-03-01 DIAGNOSIS — N200 Calculus of kidney: Secondary | ICD-10-CM

## 2023-03-01 NOTE — Addendum Note (Signed)
Addended by: Grier Rocher on: 03/01/2023 10:24 AM   Modules accepted: Orders

## 2023-03-04 ENCOUNTER — Encounter (HOSPITAL_COMMUNITY)
Admission: RE | Admit: 2023-03-04 | Discharge: 2023-03-04 | Disposition: A | Discharge status: Home / Self Care | Payer: Medicare Other | Source: Ambulatory Visit | Attending: Urology | Admitting: Urology

## 2023-03-04 ENCOUNTER — Encounter (HOSPITAL_COMMUNITY): Payer: Self-pay | Admitting: Urology

## 2023-03-05 ENCOUNTER — Encounter (HOSPITAL_COMMUNITY): Payer: Self-pay | Admitting: Urology

## 2023-03-05 ENCOUNTER — Ambulatory Visit (HOSPITAL_COMMUNITY)
Admission: RE | Admit: 2023-03-05 | Discharge: 2023-03-05 | Disposition: A | Discharge status: Home / Self Care | Payer: Medicare Other | Attending: Urology | Admitting: Urology

## 2023-03-05 ENCOUNTER — Encounter (HOSPITAL_COMMUNITY)
Admission: RE | Disposition: A | Discharge status: Home / Self Care | Payer: Self-pay | Source: Home / Self Care | Attending: Urology

## 2023-03-05 ENCOUNTER — Encounter: Payer: Self-pay | Admitting: Urology

## 2023-03-05 ENCOUNTER — Ambulatory Visit (HOSPITAL_COMMUNITY): Payer: Medicare Other

## 2023-03-05 DIAGNOSIS — E119 Type 2 diabetes mellitus without complications: Secondary | ICD-10-CM | POA: Diagnosis not present

## 2023-03-05 DIAGNOSIS — Z87442 Personal history of urinary calculi: Secondary | ICD-10-CM | POA: Diagnosis not present

## 2023-03-05 DIAGNOSIS — I10 Essential (primary) hypertension: Secondary | ICD-10-CM | POA: Insufficient documentation

## 2023-03-05 DIAGNOSIS — N200 Calculus of kidney: Secondary | ICD-10-CM | POA: Insufficient documentation

## 2023-03-05 DIAGNOSIS — M797 Fibromyalgia: Secondary | ICD-10-CM | POA: Diagnosis not present

## 2023-03-05 DIAGNOSIS — Z7984 Long term (current) use of oral hypoglycemic drugs: Secondary | ICD-10-CM | POA: Insufficient documentation

## 2023-03-05 HISTORY — DX: Unspecified osteoarthritis, unspecified site: M19.90

## 2023-03-05 HISTORY — PX: EXTRACORPOREAL SHOCK WAVE LITHOTRIPSY: SHX1557

## 2023-03-05 LAB — GLUCOSE, CAPILLARY: Glucose-Capillary: 127 mg/dL — ABNORMAL HIGH (ref 70–99)

## 2023-03-05 SURGERY — LITHOTRIPSY, ESWL
Anesthesia: LOCAL | Laterality: Left

## 2023-03-05 MED ORDER — DIPHENHYDRAMINE HCL 25 MG PO CAPS
25.0000 mg | ORAL_CAPSULE | ORAL | Status: AC
  Administered 2023-03-05: 25 mg via ORAL
  Filled 2023-03-05: qty 1

## 2023-03-05 MED ORDER — DIAZEPAM 5 MG PO TABS
10.0000 mg | ORAL_TABLET | Freq: Once | ORAL | Status: AC
  Administered 2023-03-05: 10 mg via ORAL
  Filled 2023-03-05: qty 2

## 2023-03-05 MED ORDER — SODIUM CHLORIDE 0.9 % IV SOLN
INTRAVENOUS | Status: DC
  Administered 2023-03-05: 1000 mL via INTRAVENOUS

## 2023-03-05 NOTE — H&P (Signed)
H&P  Chief Complaint: Lt kidney stone  History of Present Illness: Allison Oneill is a 72 y.o. year old female w/ h/o recurrent urolithiasis presents for ESL of a 9 mm LLP stone.  Past Medical History:  Diagnosis Date   Anxiety    Arthritis    Diabetes mellitus without complication (HCC)    TYPE 2   Fibromyalgia    History of kidney stones    Hypertension    Left renal stone    Neuromuscular disorder (HCC)    restless leg syndrome   Neuropathy    FEET    Past Surgical History:  Procedure Laterality Date   ABDOMINAL HYSTERECTOMY  2000   COMPLETE   CYSTOSCOPY N/A 08/27/2013   Procedure: CYSTOSCOPY; REMOVAL OF BLADDER CALCULUS;  Surgeon: Ky Barban, MD;  Location: [facility];  Service: Urology;  Laterality: N/A;   CYSTOSCOPY W/ URETERAL STENT PLACEMENT N/A 2012   CYSTOSCOPY W/ URETERAL STENT REMOVAL N/A 2013   IR URETERAL STENT LEFT NEW ACCESS W/O SEP NEPHROSTOMY CATH  08/07/2018   IR URETERAL STENT LEFT NEW ACCESS W/O SEP NEPHROSTOMY CATH  08/03/2021   LITHOTRIPSY  2012   NEPHROLITHOTOMY Left 08/07/2018   Procedure: NEPHROLITHOTOMY PERCUTANEOUS;  Surgeon: Marcine Matar, MD;  Location: [facility];  Service: Urology;  Laterality: Left;   NEPHROLITHOTOMY Left 08/03/2021   Procedure: NEPHROLITHOTOMY PERCUTANEOUS;  Surgeon: Marcine Matar, MD;  Location: [facility];  Service: Urology;  Laterality: Left;   NEUROMA SURGERY Left 1997   PARATHYROIDECTOMY Right 02/27/2019   Procedure: RIGHT INFERIOR PARATHYROIDECTOMY;  Surgeon: Darnell Level, MD;  Location: [facility];  Service: General;  Laterality: Right;    Home Medications:  Medications Prior to Admission  Medication Sig Dispense Refill   ACCU-CHEK GUIDE test strip daily.     amLODipine (NORVASC) 10 MG tablet Take 1 tablet (10 mg total) by mouth daily. 30 tablet 5   Blood Glucose Monitoring Suppl (ACCU-CHEK GUIDE ME) w/Device KIT daily.     fexofenadine (ALLEGRA) 180 MG tablet Take 180 mg by mouth at bedtime.      fluticasone (FLONASE)  50 MCG/ACT nasal spray Place 2 sprays into both nostrils daily as needed for allergies.     gabapentin (NEURONTIN) 300 MG capsule Take 300 mg by mouth daily.     glipiZIDE (GLUCOTROL XL) 10 MG 24 hr tablet Take 10 mg by mouth in the morning and at bedtime.     Lancets Misc. (ACCU-CHEK FASTCLIX LANCET) KIT daily.     lisinopril (PRINIVIL,ZESTRIL) 5 MG tablet Take 5 mg by mouth every morning.     metFORMIN (GLUCOPHAGE-XR) 500 MG 24 hr tablet Take 500 mg by mouth in the morning and at bedtime.     rosuvastatin (CRESTOR) 20 MG tablet Take 20 mg by mouth at bedtime.     sertraline (ZOLOFT) 100 MG tablet Take 100 mg by mouth daily.      Allergies:  Allergies  Allergen Reactions   Alfuzosin Other (See Comments)    Made her feel dizzy and "out of it"   Sulfa Antibiotics Itching and Rash    History reviewed. No pertinent family history.  Social History:  reports that she has never smoked. She has never used smokeless tobacco. She reports that she does not drink alcohol and does not use drugs.  ROS: A complete review of systems was performed.  All systems are negative except for pertinent findings as noted.  Physical Exam:  Vital signs in last 24 hours:   General:  Alert and oriented, No acute distress HEENT: Normocephalic, atraumatic Neurologic: Grossly intact  I have reviewed urinalysis results  I have independently reviewed prior imaging  Impression/Assessment:  9 mm LLP stone  Plan:  ESL. Pt is aware that this is the first part of a possible staged procedure  C[place]sea Aus 03/05/2023, 9:18 AM  Bertram Millard. Sandy Haye MD

## 2023-03-05 NOTE — Op Note (Signed)
See [facility] OP note scanned into chart. 

## 2023-03-05 NOTE — Progress Notes (Signed)
Noted on left flank, quarter sized round reddened area, post lithotripsy.  No break in the skin.

## 2023-03-05 NOTE — Discharge Instructions (Signed)
See [facility] discharge instructions in chart.  

## 2023-03-06 ENCOUNTER — Encounter (HOSPITAL_COMMUNITY): Payer: Self-pay | Admitting: Urology

## 2023-03-13 DIAGNOSIS — Z23 Encounter for immunization: Secondary | ICD-10-CM | POA: Diagnosis not present

## 2023-03-25 ENCOUNTER — Ambulatory Visit (HOSPITAL_COMMUNITY)
Admission: RE | Admit: 2023-03-25 | Discharge: 2023-03-25 | Disposition: A | Discharge status: Home / Self Care | Payer: Medicare Other | Source: Ambulatory Visit | Attending: Urology | Admitting: Urology

## 2023-03-25 DIAGNOSIS — N289 Disorder of kidney and ureter, unspecified: Secondary | ICD-10-CM | POA: Insufficient documentation

## 2023-03-25 DIAGNOSIS — R109 Unspecified abdominal pain: Secondary | ICD-10-CM | POA: Diagnosis not present

## 2023-03-25 DIAGNOSIS — N200 Calculus of kidney: Secondary | ICD-10-CM | POA: Diagnosis not present

## 2023-03-25 DIAGNOSIS — K5730 Diverticulosis of large intestine without perforation or abscess without bleeding: Secondary | ICD-10-CM | POA: Diagnosis not present

## 2023-03-26 ENCOUNTER — Encounter: Payer: Self-pay | Admitting: Urology

## 2023-03-26 ENCOUNTER — Ambulatory Visit: Payer: Medicare Other | Admitting: Urology

## 2023-03-26 VITALS — BP 130/66 | HR 93

## 2023-03-26 DIAGNOSIS — Z09 Encounter for follow-up examination after completed treatment for conditions other than malignant neoplasm: Secondary | ICD-10-CM

## 2023-03-26 DIAGNOSIS — N200 Calculus of kidney: Secondary | ICD-10-CM | POA: Diagnosis not present

## 2023-03-26 DIAGNOSIS — Z87442 Personal history of urinary calculi: Secondary | ICD-10-CM

## 2023-03-26 LAB — URINALYSIS, ROUTINE W REFLEX MICROSCOPIC
Bilirubin, UA: NEGATIVE
Glucose, UA: NEGATIVE
Ketones, UA: NEGATIVE
Nitrite, UA: NEGATIVE
Protein,UA: NEGATIVE
Specific Gravity, UA: 1.025 (ref 1.005–1.030)
Urobilinogen, Ur: 0.2 mg/dL (ref 0.2–1.0)
pH, UA: 6 (ref 5.0–7.5)

## 2023-03-26 LAB — MICROSCOPIC EXAMINATION: Epithelial Cells (non renal): >10 /[HPF] — AB (ref 0–10)

## 2023-03-26 NOTE — Progress Notes (Signed)
History of Present Illness: ARGIE AMSTERDAM is a 72 y.o. year old female with a history of recurrent urolithiasis presents for follow-up.  She underwent ESWL of a 9 mm left lower pole stone on 10.1.2024.  She brings a few stone fragments with her.  She has had no left-sided pain since her treatment. Past Medical History:  Diagnosis Date   Anxiety    Arthritis    Diabetes mellitus without complication (HCC)    TYPE 2   Fibromyalgia    History of kidney stones    Hypertension    Left renal stone    Neuromuscular disorder (HCC)    restless leg syndrome   Neuropathy    FEET    Past Surgical History:  Procedure Laterality Date   ABDOMINAL HYSTERECTOMY  2000   COMPLETE   CYSTOSCOPY N/A 08/27/2013   Procedure: CYSTOSCOPY; REMOVAL OF BLADDER CALCULUS;  Surgeon: Ky Barban, MD;  Location: [facility];  Service: Urology;  Laterality: N/A;   CYSTOSCOPY W/ URETERAL STENT PLACEMENT N/A 2012   CYSTOSCOPY W/ URETERAL STENT REMOVAL N/A 2013   EXTRACORPOREAL SHOCK WAVE LITHOTRIPSY Left 03/05/2023   Procedure: EXTRACORPOREAL SHOCK WAVE LITHOTRIPSY (ESWL);  Surgeon: Marcine Matar, MD;  Location: [facility];  Service: Urology;  Laterality: Left;   IR URETERAL STENT LEFT NEW ACCESS W/O SEP NEPHROSTOMY CATH  08/07/2018   IR URETERAL STENT LEFT NEW ACCESS W/O SEP NEPHROSTOMY CATH  08/03/2021   LITHOTRIPSY  2012   NEPHROLITHOTOMY Left 08/07/2018   Procedure: NEPHROLITHOTOMY PERCUTANEOUS;  Surgeon: Marcine Matar, MD;  Location: [facility];  Service: Urology;  Laterality: Left;   NEPHROLITHOTOMY Left 08/03/2021   Procedure: NEPHROLITHOTOMY PERCUTANEOUS;  Surgeon: Marcine Matar, MD;  Location: [facility];  Service: Urology;  Laterality: Left;   NEUROMA SURGERY Left 1997   PARATHYROIDECTOMY Right 02/27/2019   Procedure: RIGHT INFERIOR PARATHYROIDECTOMY;  Surgeon: Darnell Level, MD;  Location: [facility];  Service: General;  Laterality: Right;    Home Medications:  (Not in a hospital admission)   Allergies:   Allergies  Allergen Reactions   Alfuzosin Other (See Comments)    Made her feel dizzy and "out of it"   Sulfa Antibiotics Itching and Rash    No family history on file.  Social History:  reports that she has never smoked. She has never used smokeless tobacco. She reports that she does not drink alcohol and does not use drugs.  ROS: A complete review of systems was performed.  All systems are negative except for pertinent findings as noted.  Physical Exam:  Vital signs in last 24 hours: @VSRANGES @ General:  Alert and oriented, No acute distress HEENT: Normocephalic, atraumatic Neck: No JVD or lymphadenopathy Cardiovascular: Regular rate  Lungs: Normal inspiratory/expiratory excursion Abdomen: Soft, nontender, nondistended, no abdominal masses Back: No CVA tenderness Extremities: No edema Neurologic: Grossly intact  I have reviewed prior pt notes  I have reviewed urinalysis results  I have independently reviewed prior imaging-data CT performed yesterday revealed some fragments in her left lower pole.  No hydronephrosis.  No obvious right sided stones  24-hour urine from 2021 reviewed-very low urine volume, low citrate, high supersaturation of calcium oxalate  Impression/Assessment:  History of recurrent urolithiasis-status post recent ESL of left lower pole stone with some fragments remaining  Plan:  I recommended that she lay on the right lateral decubitus position is much as possible to help fragments leave the left kidney  I have her come back in about 2 months with repeat KUB  Dietary instructions discussed  Bertram Millard Marlis Oldaker 03/26/2023, 8:25 AM  Bertram Millard. Topanga Alvelo MD

## 2023-04-09 DIAGNOSIS — L910 Hypertrophic scar: Secondary | ICD-10-CM | POA: Diagnosis not present

## 2023-04-09 DIAGNOSIS — L570 Actinic keratosis: Secondary | ICD-10-CM | POA: Diagnosis not present

## 2023-04-29 DIAGNOSIS — M79675 Pain in left toe(s): Secondary | ICD-10-CM | POA: Diagnosis not present

## 2023-04-29 DIAGNOSIS — I739 Peripheral vascular disease, unspecified: Secondary | ICD-10-CM | POA: Diagnosis not present

## 2023-04-29 DIAGNOSIS — M79672 Pain in left foot: Secondary | ICD-10-CM | POA: Diagnosis not present

## 2023-04-29 DIAGNOSIS — M79671 Pain in right foot: Secondary | ICD-10-CM | POA: Diagnosis not present

## 2023-04-29 DIAGNOSIS — E1140 Type 2 diabetes mellitus with diabetic neuropathy, unspecified: Secondary | ICD-10-CM | POA: Diagnosis not present

## 2023-04-29 DIAGNOSIS — M79674 Pain in right toe(s): Secondary | ICD-10-CM | POA: Diagnosis not present

## 2023-04-29 DIAGNOSIS — L110 Acquired keratosis follicularis: Secondary | ICD-10-CM | POA: Diagnosis not present

## 2023-05-09 ENCOUNTER — Encounter: Payer: Self-pay | Admitting: Urology

## 2023-05-10 DIAGNOSIS — H353111 Nonexudative age-related macular degeneration, right eye, early dry stage: Secondary | ICD-10-CM | POA: Diagnosis not present

## 2023-05-10 DIAGNOSIS — H524 Presbyopia: Secondary | ICD-10-CM | POA: Diagnosis not present

## 2023-05-14 ENCOUNTER — Other Ambulatory Visit: Payer: Self-pay | Admitting: Neurology

## 2023-06-04 ENCOUNTER — Ambulatory Visit: Payer: Medicare Other | Admitting: Urology

## 2023-07-08 DIAGNOSIS — E1140 Type 2 diabetes mellitus with diabetic neuropathy, unspecified: Secondary | ICD-10-CM | POA: Diagnosis not present

## 2023-07-08 DIAGNOSIS — M79675 Pain in left toe(s): Secondary | ICD-10-CM | POA: Diagnosis not present

## 2023-07-08 DIAGNOSIS — L110 Acquired keratosis follicularis: Secondary | ICD-10-CM | POA: Diagnosis not present

## 2023-07-08 DIAGNOSIS — I739 Peripheral vascular disease, unspecified: Secondary | ICD-10-CM | POA: Diagnosis not present

## 2023-07-08 DIAGNOSIS — M79674 Pain in right toe(s): Secondary | ICD-10-CM | POA: Diagnosis not present

## 2023-07-08 DIAGNOSIS — M79672 Pain in left foot: Secondary | ICD-10-CM | POA: Diagnosis not present

## 2023-07-08 DIAGNOSIS — M79671 Pain in right foot: Secondary | ICD-10-CM | POA: Diagnosis not present

## 2023-07-08 NOTE — Progress Notes (Signed)
 History of Present Illness: Allison Oneill is a 73 y.o. year old female with a history of recurrent urolithiasis presents for follow-up.  She underwent ESWL of a 9 mm left lower pole stone on 10.1.2024.  She had CT stone protocol done 3 weeks after the lithotripsy as she had right sided flank pain.  This showed no right-sided urolithiasis, significantly decreased left renal stone burden.  Currently, she is having no flank pain.  Had a KUB done prior to this visit.  No gross hematuria, no dysuria.  She has not passed fragments recently.     Past Medical History:  Diagnosis Date   Anxiety    Arthritis    Diabetes mellitus without complication (HCC)    TYPE 2   Fibromyalgia    History of kidney stones    Hypertension    Left renal stone    Neuromuscular disorder (HCC)    restless leg syndrome   Neuropathy    FEET    Past Surgical History:  Procedure Laterality Date   ABDOMINAL HYSTERECTOMY  2000   COMPLETE   CYSTOSCOPY N/A 08/27/2013   Procedure: CYSTOSCOPY; REMOVAL OF BLADDER CALCULUS;  Surgeon: Emery LILLETTE Blaze, MD;  Location: [facility];  Service: Urology;  Laterality: N/A;   CYSTOSCOPY W/ URETERAL STENT PLACEMENT N/A 2012   CYSTOSCOPY W/ URETERAL STENT REMOVAL N/A 2013   EXTRACORPOREAL SHOCK WAVE LITHOTRIPSY Left 03/05/2023   Procedure: EXTRACORPOREAL SHOCK WAVE LITHOTRIPSY (ESWL);  Surgeon: Matilda Senior, MD;  Location: [facility];  Service: Urology;  Laterality: Left;   IR URETERAL STENT LEFT NEW ACCESS W/O SEP NEPHROSTOMY CATH  08/07/2018   IR URETERAL STENT LEFT NEW ACCESS W/O SEP NEPHROSTOMY CATH  08/03/2021   LITHOTRIPSY  2012   NEPHROLITHOTOMY Left 08/07/2018   Procedure: NEPHROLITHOTOMY PERCUTANEOUS;  Surgeon: Matilda Senior, MD;  Location: [facility];  Service: Urology;  Laterality: Left;   NEPHROLITHOTOMY Left 08/03/2021   Procedure: NEPHROLITHOTOMY PERCUTANEOUS;  Surgeon: Matilda Senior, MD;  Location: [facility];  Service: Urology;  Laterality: Left;   NEUROMA SURGERY  Left 1997   PARATHYROIDECTOMY Right 02/27/2019   Procedure: RIGHT INFERIOR PARATHYROIDECTOMY;  Surgeon: Eletha Boas, MD;  Location: [facility];  Service: General;  Laterality: Right;    Home Medications:  (Not in a hospital admission)   Allergies:  Allergies  Allergen Reactions   Alfuzosin  Other (See Comments)    Made her feel dizzy and out of it   Sulfa Antibiotics Itching and Rash    No family history on file.  Social History:  reports that she has never smoked. She has never used smokeless tobacco. She reports that she does not drink alcohol and does not use drugs.  ROS: A complete review of systems was performed.  All systems are negative except for pertinent findings as noted.  Physical Exam:  Vital signs in last 24 hours: @VSRANGES @ General:  Alert and oriented, No acute distress HEENT: Normocephalic, atraumatic Neck: No JVD or lymphadenopathy Cardiovascular: Regular rate  Lungs: Normal inspiratory/expiratory excursion Abdomen: Soft, nontender, nondistended, no abdominal masses Back: No CVA tenderness Extremities: No edema Neurologic: Grossly intact  I have reviewed prior pt notes  I have reviewed urinalysis results  I have independently reviewed prior imaging-prior CT scan from October, prior KUB from October, KUB from today.  No calcifications overlying the right renal contour.  Small fragment in interpolar left renal area approximately 2 mm in size.  Several small calculi in the lower pole calyx on the left.  There is a  calcific density to the left of the mid lumbar spine.  Quite rounded.  I do not think that this is a stone.  24-hour urine from 2021 reviewed-very low urine volume, low citrate, high supersaturation of calcium  oxalate  Stone composition 20% calcium  oxalate monohydrate, 80% uric acid  Impression/Assessment:  History of recurrent urolithiasis-status post recent ESL of left lower pole stone with some fragments remaining  Uric acid  urolithiasis  Plan:  1.  Continue to increase fluid intake  2.  Her urine was sent for culture today.  No antibiotic administered  3.  I sent her out with a 24-hour urine request-will call with results  4.  I like to see her back in about 3 months to go over 24-hour urine request, recheck urine, recheck KUB  Garnette HERO Mosiah Bastin 07/08/2023, 12:23 PM  Garnette HERO. Robin Petrakis MD

## 2023-07-11 ENCOUNTER — Ambulatory Visit: Payer: Medicare Other | Admitting: Urology

## 2023-07-11 ENCOUNTER — Ambulatory Visit (HOSPITAL_COMMUNITY)
Admission: RE | Admit: 2023-07-11 | Discharge: 2023-07-11 | Disposition: A | Discharge status: Home / Self Care | Payer: Medicare Other | Source: Ambulatory Visit | Attending: Urology | Admitting: Urology

## 2023-07-11 VITALS — BP 151/63 | HR 88

## 2023-07-11 DIAGNOSIS — R109 Unspecified abdominal pain: Secondary | ICD-10-CM | POA: Diagnosis not present

## 2023-07-11 DIAGNOSIS — N200 Calculus of kidney: Secondary | ICD-10-CM | POA: Diagnosis not present

## 2023-07-11 DIAGNOSIS — I878 Other specified disorders of veins: Secondary | ICD-10-CM | POA: Diagnosis not present

## 2023-07-11 LAB — URINALYSIS, ROUTINE W REFLEX MICROSCOPIC
Bilirubin, UA: NEGATIVE
Nitrite, UA: NEGATIVE
Specific Gravity, UA: 1.025 (ref 1.005–1.030)
Urobilinogen, Ur: 0.2 mg/dL (ref 0.2–1.0)
pH, UA: 6 (ref 5.0–7.5)

## 2023-07-11 LAB — MICROSCOPIC EXAMINATION: WBC, UA: >30 /[HPF] — AB (ref 0–5)

## 2023-07-13 LAB — URINE CULTURE

## 2023-07-16 ENCOUNTER — Ambulatory Visit: Payer: Medicare Other | Admitting: Urology

## 2023-07-17 DIAGNOSIS — N200 Calculus of kidney: Secondary | ICD-10-CM | POA: Diagnosis not present

## 2023-07-22 ENCOUNTER — Telehealth: Payer: Self-pay

## 2023-07-22 NOTE — Telephone Encounter (Signed)
-----   Message from Nurse [facility] sent at 07/22/2023  2:28 PM EST -----  ----- Message ----- From: Marcine Matar, MD Sent: 07/22/2023  12:50 PM EST To: Gustavus Messing, LPN; Troy Sine, CMA; #  Let patient know that her urine culture was normal/negative, no antibiotics needed. ----- Message ----- From: Nell Range Lab Results In Sent: 07/11/2023   3:36 PM EST To: Marcine Matar, MD

## 2023-07-22 NOTE — Telephone Encounter (Signed)
Pt called to relay message from MD see previous telephone encounter

## 2023-08-13 DIAGNOSIS — I10 Essential (primary) hypertension: Secondary | ICD-10-CM | POA: Diagnosis not present

## 2023-08-13 DIAGNOSIS — N183 Chronic kidney disease, stage 3 unspecified: Secondary | ICD-10-CM | POA: Diagnosis not present

## 2023-08-13 DIAGNOSIS — E1122 Type 2 diabetes mellitus with diabetic chronic kidney disease: Secondary | ICD-10-CM | POA: Diagnosis not present

## 2023-08-13 DIAGNOSIS — Z13220 Encounter for screening for lipoid disorders: Secondary | ICD-10-CM | POA: Diagnosis not present

## 2023-08-13 DIAGNOSIS — E1140 Type 2 diabetes mellitus with diabetic neuropathy, unspecified: Secondary | ICD-10-CM | POA: Diagnosis not present

## 2023-08-13 DIAGNOSIS — Z1329 Encounter for screening for other suspected endocrine disorder: Secondary | ICD-10-CM | POA: Diagnosis not present

## 2023-08-16 ENCOUNTER — Ambulatory Visit (HOSPITAL_COMMUNITY)
Admission: RE | Admit: 2023-08-16 | Discharge: 2023-08-16 | Disposition: A | Discharge status: Home / Self Care | Payer: Medicare Other | Source: Ambulatory Visit | Attending: Urology | Admitting: Urology

## 2023-08-16 DIAGNOSIS — N200 Calculus of kidney: Secondary | ICD-10-CM | POA: Insufficient documentation

## 2023-08-19 ENCOUNTER — Ambulatory Visit: Payer: Medicare Other | Admitting: Urology

## 2023-08-19 DIAGNOSIS — Z0001 Encounter for general adult medical examination with abnormal findings: Secondary | ICD-10-CM | POA: Diagnosis not present

## 2023-08-19 DIAGNOSIS — E1140 Type 2 diabetes mellitus with diabetic neuropathy, unspecified: Secondary | ICD-10-CM | POA: Diagnosis not present

## 2023-08-19 DIAGNOSIS — E1122 Type 2 diabetes mellitus with diabetic chronic kidney disease: Secondary | ICD-10-CM | POA: Diagnosis not present

## 2023-08-19 DIAGNOSIS — N1832 Chronic kidney disease, stage 3b: Secondary | ICD-10-CM | POA: Diagnosis not present

## 2023-08-19 DIAGNOSIS — E782 Mixed hyperlipidemia: Secondary | ICD-10-CM | POA: Diagnosis not present

## 2023-09-06 DIAGNOSIS — M25512 Pain in left shoulder: Secondary | ICD-10-CM | POA: Diagnosis not present

## 2023-09-06 DIAGNOSIS — G8929 Other chronic pain: Secondary | ICD-10-CM | POA: Diagnosis not present

## 2023-09-06 DIAGNOSIS — M7522 Bicipital tendinitis, left shoulder: Secondary | ICD-10-CM | POA: Diagnosis not present

## 2023-09-09 DIAGNOSIS — M25512 Pain in left shoulder: Secondary | ICD-10-CM | POA: Diagnosis not present

## 2023-09-16 DIAGNOSIS — E1140 Type 2 diabetes mellitus with diabetic neuropathy, unspecified: Secondary | ICD-10-CM | POA: Diagnosis not present

## 2023-09-16 DIAGNOSIS — I739 Peripheral vascular disease, unspecified: Secondary | ICD-10-CM | POA: Diagnosis not present

## 2023-09-16 DIAGNOSIS — M79675 Pain in left toe(s): Secondary | ICD-10-CM | POA: Diagnosis not present

## 2023-09-16 DIAGNOSIS — M79674 Pain in right toe(s): Secondary | ICD-10-CM | POA: Diagnosis not present

## 2023-09-16 DIAGNOSIS — M79671 Pain in right foot: Secondary | ICD-10-CM | POA: Diagnosis not present

## 2023-09-16 DIAGNOSIS — L110 Acquired keratosis follicularis: Secondary | ICD-10-CM | POA: Diagnosis not present

## 2023-09-16 DIAGNOSIS — M79672 Pain in left foot: Secondary | ICD-10-CM | POA: Diagnosis not present

## 2023-10-07 NOTE — Progress Notes (Signed)
 History of Present Illness: Allison Oneill is a 73 y.o. year old female with a history of recurrent urolithiasis presents for follow-up.  She underwent ESWL of a 9 mm left lower pole stone on 10.1.2024.  She had CT stone protocol done 3 weeks after the lithotripsy as she had right sided flank pain.  This showed no right-sided urolithiasis, significantly decreased left renal stone burden.  5.6.2025: Here for routine check.  KUB done earlier this morning.  No stone related symptoms over the past 3 months.  I have not yet received 24-hour urine results.     Past Medical History:  Diagnosis Date   Anxiety    Arthritis    Diabetes mellitus without complication (HCC)    TYPE 2   Fibromyalgia    History of kidney stones    Hypertension    Left renal stone    Neuromuscular disorder (HCC)    restless leg syndrome   Neuropathy    FEET    Past Surgical History:  Procedure Laterality Date   ABDOMINAL HYSTERECTOMY  2000   COMPLETE   CYSTOSCOPY N/A 08/27/2013   Procedure: CYSTOSCOPY; REMOVAL OF BLADDER CALCULUS;  Surgeon: Reggie Caper, MD;  Location: [facility];  Service: Urology;  Laterality: N/A;   CYSTOSCOPY W/ URETERAL STENT PLACEMENT N/A 2012   CYSTOSCOPY W/ URETERAL STENT REMOVAL N/A 2013   EXTRACORPOREAL SHOCK WAVE LITHOTRIPSY Left 03/05/2023   Procedure: EXTRACORPOREAL SHOCK WAVE LITHOTRIPSY (ESWL);  Surgeon: Trent Frizzle, MD;  Location: [facility];  Service: Urology;  Laterality: Left;   IR URETERAL STENT LEFT NEW ACCESS W/O SEP NEPHROSTOMY CATH  08/07/2018   IR URETERAL STENT LEFT NEW ACCESS W/O SEP NEPHROSTOMY CATH  08/03/2021   LITHOTRIPSY  2012   NEPHROLITHOTOMY Left 08/07/2018   Procedure: NEPHROLITHOTOMY PERCUTANEOUS;  Surgeon: Trent Frizzle, MD;  Location: [facility];  Service: Urology;  Laterality: Left;   NEPHROLITHOTOMY Left 08/03/2021   Procedure: NEPHROLITHOTOMY PERCUTANEOUS;  Surgeon: Trent Frizzle, MD;  Location: [facility];  Service: Urology;  Laterality: Left;    NEUROMA SURGERY Left 1997   PARATHYROIDECTOMY Right 02/27/2019   Procedure: RIGHT INFERIOR PARATHYROIDECTOMY;  Surgeon: Oralee Billow, MD;  Location: [facility];  Service: General;  Laterality: Right;    Home Medications:  (Not in a hospital admission)   Allergies:  Allergies  Allergen Reactions   Alfuzosin  Other (See Comments)    Made her feel dizzy and "out of it"   Sulfa Antibiotics Itching and Rash    No family history on file.  Social History:  reports that she has never smoked. She has never used smokeless tobacco. She reports that she does not drink alcohol and does not use drugs.  ROS: A complete review of systems was performed.  All systems are negative except for pertinent findings as noted.  Physical Exam:  Vital signs in last 24 hours: @VSRANGES @ General:  Alert and oriented, No acute distress HEENT: Normocephalic, atraumatic Neck: No JVD or lymphadenopathy Cardiovascular: Regular rate  Lungs: Normal inspiratory/expiratory excursion Abdomen: Soft, nontender, nondistended, no abdominal masses Back: No CVA tenderness Extremities: No edema Neurologic: Grossly intact  I have reviewed prior pt notes  I have reviewed urinalysis results  KUB images reviewed with patient.  No calcifications overlying the right renal contour.  Small dustlike appearance of calcification in the lower pole of left kidney approximately 3 to 4 mm in size.  Otherwise, no evidence of further urolithiasis  24-hour urine results sent/received: Urine volume-1.06 L Calcium  level 34 Citrate low  at 238 Uric acid saturation slightly high at 1.34 Stone composition 20% calcium  oxalate monohydrate, 80% uric acid  Impression/Assessment:  History of recurrent urolithiasis-status post recent ESL of left lower pole stone with minimal stone burden  Uric acid urolithiasis--24-hour urine revealed relatively low uric acid composition  Plan:  1.  Continue to increase fluid intake  2.  Patient reassured  about renal function as well as stone burden  3.  I will have her come back in 6 months with KUB  Malcolm Scrivener Giselle Brutus 10/07/2023, 6:55 PM  Malcolm Scrivener. Norvel Wenker MD

## 2023-10-08 ENCOUNTER — Telehealth: Payer: Self-pay

## 2023-10-08 ENCOUNTER — Encounter: Payer: Self-pay | Admitting: Urology

## 2023-10-08 ENCOUNTER — Ambulatory Visit (INDEPENDENT_AMBULATORY_CARE_PROVIDER_SITE_OTHER): Payer: Medicare Other | Admitting: Urology

## 2023-10-08 ENCOUNTER — Ambulatory Visit (HOSPITAL_COMMUNITY)
Admission: RE | Admit: 2023-10-08 | Discharge: 2023-10-08 | Disposition: A | Discharge status: Home / Self Care | Source: Ambulatory Visit | Attending: Urology | Admitting: Urology

## 2023-10-08 VITALS — BP 109/57 | HR 84

## 2023-10-08 DIAGNOSIS — Z87442 Personal history of urinary calculi: Secondary | ICD-10-CM | POA: Diagnosis not present

## 2023-10-08 DIAGNOSIS — N200 Calculus of kidney: Secondary | ICD-10-CM | POA: Diagnosis not present

## 2023-10-08 DIAGNOSIS — I878 Other specified disorders of veins: Secondary | ICD-10-CM | POA: Diagnosis not present

## 2023-10-08 DIAGNOSIS — E210 Primary hyperparathyroidism: Secondary | ICD-10-CM

## 2023-10-08 NOTE — Telephone Encounter (Signed)
-----   Message from Malcolm Scrivener Dahlstedt sent at 10/08/2023 12:08 PM EDT ----- Call patient-I received 24-hour urine results.  It all looks pretty good, but she does need to drink more water .  Also add lemon or lime juice to water .

## 2023-10-08 NOTE — Telephone Encounter (Signed)
 Called to relay message from MD Dahlstedt left detailed vm

## 2023-10-10 DIAGNOSIS — Z780 Asymptomatic menopausal state: Secondary | ICD-10-CM | POA: Diagnosis not present

## 2023-10-10 DIAGNOSIS — M810 Age-related osteoporosis without current pathological fracture: Secondary | ICD-10-CM | POA: Diagnosis not present

## 2023-11-11 DIAGNOSIS — E1122 Type 2 diabetes mellitus with diabetic chronic kidney disease: Secondary | ICD-10-CM | POA: Diagnosis not present

## 2023-11-11 DIAGNOSIS — E7849 Other hyperlipidemia: Secondary | ICD-10-CM | POA: Diagnosis not present

## 2023-11-11 DIAGNOSIS — Z0001 Encounter for general adult medical examination with abnormal findings: Secondary | ICD-10-CM | POA: Diagnosis not present

## 2023-11-11 DIAGNOSIS — E1140 Type 2 diabetes mellitus with diabetic neuropathy, unspecified: Secondary | ICD-10-CM | POA: Diagnosis not present

## 2023-11-11 DIAGNOSIS — I10 Essential (primary) hypertension: Secondary | ICD-10-CM | POA: Diagnosis not present

## 2023-11-11 DIAGNOSIS — N1832 Chronic kidney disease, stage 3b: Secondary | ICD-10-CM | POA: Diagnosis not present

## 2023-11-11 DIAGNOSIS — Z1329 Encounter for screening for other suspected endocrine disorder: Secondary | ICD-10-CM | POA: Diagnosis not present

## 2023-11-18 DIAGNOSIS — I10 Essential (primary) hypertension: Secondary | ICD-10-CM | POA: Diagnosis not present

## 2023-11-18 DIAGNOSIS — M51361 Other intervertebral disc degeneration, lumbar region with lower extremity pain only: Secondary | ICD-10-CM | POA: Diagnosis not present

## 2023-11-18 DIAGNOSIS — E1169 Type 2 diabetes mellitus with other specified complication: Secondary | ICD-10-CM | POA: Diagnosis not present

## 2023-11-18 DIAGNOSIS — E782 Mixed hyperlipidemia: Secondary | ICD-10-CM | POA: Diagnosis not present

## 2023-11-26 DIAGNOSIS — L110 Acquired keratosis follicularis: Secondary | ICD-10-CM | POA: Diagnosis not present

## 2023-11-26 DIAGNOSIS — M79672 Pain in left foot: Secondary | ICD-10-CM | POA: Diagnosis not present

## 2023-11-26 DIAGNOSIS — M79671 Pain in right foot: Secondary | ICD-10-CM | POA: Diagnosis not present

## 2023-11-26 DIAGNOSIS — E1140 Type 2 diabetes mellitus with diabetic neuropathy, unspecified: Secondary | ICD-10-CM | POA: Diagnosis not present

## 2023-11-26 DIAGNOSIS — M79674 Pain in right toe(s): Secondary | ICD-10-CM | POA: Diagnosis not present

## 2023-11-26 DIAGNOSIS — I739 Peripheral vascular disease, unspecified: Secondary | ICD-10-CM | POA: Diagnosis not present

## 2023-11-26 DIAGNOSIS — M79675 Pain in left toe(s): Secondary | ICD-10-CM | POA: Diagnosis not present

## 2023-12-26 DIAGNOSIS — Z1231 Encounter for screening mammogram for malignant neoplasm of breast: Secondary | ICD-10-CM | POA: Diagnosis not present

## 2023-12-28 DIAGNOSIS — R1084 Generalized abdominal pain: Secondary | ICD-10-CM | POA: Diagnosis not present

## 2024-02-11 DIAGNOSIS — E1165 Type 2 diabetes mellitus with hyperglycemia: Secondary | ICD-10-CM | POA: Diagnosis not present

## 2024-02-11 DIAGNOSIS — E1140 Type 2 diabetes mellitus with diabetic neuropathy, unspecified: Secondary | ICD-10-CM | POA: Diagnosis not present

## 2024-02-11 DIAGNOSIS — I739 Peripheral vascular disease, unspecified: Secondary | ICD-10-CM | POA: Diagnosis not present

## 2024-02-11 DIAGNOSIS — L110 Acquired keratosis follicularis: Secondary | ICD-10-CM | POA: Diagnosis not present

## 2024-02-11 DIAGNOSIS — N1832 Chronic kidney disease, stage 3b: Secondary | ICD-10-CM | POA: Diagnosis not present

## 2024-02-11 DIAGNOSIS — M79672 Pain in left foot: Secondary | ICD-10-CM | POA: Diagnosis not present

## 2024-02-11 DIAGNOSIS — M79675 Pain in left toe(s): Secondary | ICD-10-CM | POA: Diagnosis not present

## 2024-02-11 DIAGNOSIS — M79674 Pain in right toe(s): Secondary | ICD-10-CM | POA: Diagnosis not present

## 2024-02-11 DIAGNOSIS — E1122 Type 2 diabetes mellitus with diabetic chronic kidney disease: Secondary | ICD-10-CM | POA: Diagnosis not present

## 2024-02-11 DIAGNOSIS — M79671 Pain in right foot: Secondary | ICD-10-CM | POA: Diagnosis not present

## 2024-02-18 DIAGNOSIS — I10 Essential (primary) hypertension: Secondary | ICD-10-CM | POA: Diagnosis not present

## 2024-02-18 DIAGNOSIS — E782 Mixed hyperlipidemia: Secondary | ICD-10-CM | POA: Diagnosis not present

## 2024-02-18 DIAGNOSIS — N1832 Chronic kidney disease, stage 3b: Secondary | ICD-10-CM | POA: Diagnosis not present

## 2024-02-18 DIAGNOSIS — E1169 Type 2 diabetes mellitus with other specified complication: Secondary | ICD-10-CM | POA: Diagnosis not present

## 2024-02-18 DIAGNOSIS — Z0000 Encounter for general adult medical examination without abnormal findings: Secondary | ICD-10-CM | POA: Diagnosis not present

## 2024-02-18 DIAGNOSIS — Z0001 Encounter for general adult medical examination with abnormal findings: Secondary | ICD-10-CM | POA: Diagnosis not present

## 2024-02-18 DIAGNOSIS — Z1389 Encounter for screening for other disorder: Secondary | ICD-10-CM | POA: Diagnosis not present

## 2024-02-19 ENCOUNTER — Other Ambulatory Visit: Payer: Self-pay | Admitting: Neurology

## 2024-03-23 DIAGNOSIS — J029 Acute pharyngitis, unspecified: Secondary | ICD-10-CM | POA: Diagnosis not present

## 2024-03-23 DIAGNOSIS — Z112 Encounter for screening for other bacterial diseases: Secondary | ICD-10-CM | POA: Diagnosis not present

## 2024-03-26 ENCOUNTER — Other Ambulatory Visit: Payer: Self-pay

## 2024-03-26 ENCOUNTER — Emergency Department (HOSPITAL_COMMUNITY)

## 2024-03-26 ENCOUNTER — Observation Stay (HOSPITAL_COMMUNITY)
Admission: EM | Admit: 2024-03-26 | Discharge: 2024-03-27 | Disposition: A | Discharge status: Home / Self Care | Attending: Urology | Admitting: Urology

## 2024-03-26 ENCOUNTER — Observation Stay (HOSPITAL_COMMUNITY): Admitting: Certified Registered Nurse Anesthetist

## 2024-03-26 ENCOUNTER — Observation Stay (HOSPITAL_COMMUNITY)

## 2024-03-26 ENCOUNTER — Encounter (HOSPITAL_COMMUNITY): Payer: Self-pay

## 2024-03-26 ENCOUNTER — Encounter (HOSPITAL_COMMUNITY): Admission: EM | Disposition: A | Payer: Self-pay | Source: Home / Self Care | Attending: Emergency Medicine

## 2024-03-26 DIAGNOSIS — I10 Essential (primary) hypertension: Secondary | ICD-10-CM

## 2024-03-26 DIAGNOSIS — N200 Calculus of kidney: Secondary | ICD-10-CM

## 2024-03-26 DIAGNOSIS — R0989 Other specified symptoms and signs involving the circulatory and respiratory systems: Secondary | ICD-10-CM | POA: Diagnosis not present

## 2024-03-26 DIAGNOSIS — N201 Calculus of ureter: Secondary | ICD-10-CM

## 2024-03-26 DIAGNOSIS — A419 Sepsis, unspecified organism: Secondary | ICD-10-CM | POA: Insufficient documentation

## 2024-03-26 DIAGNOSIS — N132 Hydronephrosis with renal and ureteral calculous obstruction: Secondary | ICD-10-CM | POA: Diagnosis not present

## 2024-03-26 DIAGNOSIS — D696 Thrombocytopenia, unspecified: Secondary | ICD-10-CM | POA: Insufficient documentation

## 2024-03-26 DIAGNOSIS — F419 Anxiety disorder, unspecified: Secondary | ICD-10-CM | POA: Diagnosis not present

## 2024-03-26 DIAGNOSIS — I129 Hypertensive chronic kidney disease with stage 1 through stage 4 chronic kidney disease, or unspecified chronic kidney disease: Secondary | ICD-10-CM | POA: Insufficient documentation

## 2024-03-26 DIAGNOSIS — E119 Type 2 diabetes mellitus without complications: Secondary | ICD-10-CM

## 2024-03-26 DIAGNOSIS — E1122 Type 2 diabetes mellitus with diabetic chronic kidney disease: Secondary | ICD-10-CM | POA: Diagnosis not present

## 2024-03-26 DIAGNOSIS — N134 Hydroureter: Secondary | ICD-10-CM | POA: Diagnosis not present

## 2024-03-26 DIAGNOSIS — R2689 Other abnormalities of gait and mobility: Secondary | ICD-10-CM | POA: Insufficient documentation

## 2024-03-26 DIAGNOSIS — R569 Unspecified convulsions: Secondary | ICD-10-CM | POA: Diagnosis not present

## 2024-03-26 DIAGNOSIS — Z79899 Other long term (current) drug therapy: Secondary | ICD-10-CM | POA: Diagnosis not present

## 2024-03-26 DIAGNOSIS — Z87442 Personal history of urinary calculi: Secondary | ICD-10-CM | POA: Diagnosis not present

## 2024-03-26 DIAGNOSIS — R531 Weakness: Secondary | ICD-10-CM | POA: Diagnosis not present

## 2024-03-26 DIAGNOSIS — M6281 Muscle weakness (generalized): Secondary | ICD-10-CM | POA: Diagnosis not present

## 2024-03-26 DIAGNOSIS — J029 Acute pharyngitis, unspecified: Secondary | ICD-10-CM | POA: Diagnosis not present

## 2024-03-26 DIAGNOSIS — N179 Acute kidney failure, unspecified: Secondary | ICD-10-CM | POA: Diagnosis not present

## 2024-03-26 DIAGNOSIS — N1831 Chronic kidney disease, stage 3a: Secondary | ICD-10-CM | POA: Diagnosis not present

## 2024-03-26 DIAGNOSIS — I517 Cardiomegaly: Secondary | ICD-10-CM | POA: Diagnosis not present

## 2024-03-26 HISTORY — PX: CYSTOSCOPY W/ URETERAL STENT PLACEMENT: SHX1429

## 2024-03-26 LAB — URINALYSIS, W/ REFLEX TO CULTURE (INFECTION SUSPECTED)
Bilirubin Urine: NEGATIVE
Glucose, UA: >=500 mg/dL — AB
Ketones, ur: NEGATIVE mg/dL
Leukocytes,Ua: NEGATIVE
Nitrite: NEGATIVE
Protein, ur: 30 mg/dL — AB
Specific Gravity, Urine: 1.011 (ref 1.005–1.030)
pH: 5 (ref 5.0–8.0)

## 2024-03-26 LAB — COMPREHENSIVE METABOLIC PANEL WITH GFR
ALT: 35 U/L (ref 0–44)
AST: 75 U/L — ABNORMAL HIGH (ref 15–41)
Albumin: 4 g/dL (ref 3.5–5.0)
Alkaline Phosphatase: 63 U/L (ref 38–126)
Anion gap: 14 (ref 5–15)
BUN: 54 mg/dL — ABNORMAL HIGH (ref 8–23)
CO2: 24 mmol/L (ref 22–32)
Calcium: 8.5 mg/dL — ABNORMAL LOW (ref 8.9–10.3)
Chloride: 103 mmol/L (ref 98–111)
Creatinine, Ser: 2.14 mg/dL — ABNORMAL HIGH (ref 0.44–1.00)
GFR, Estimated: 23 mL/min — ABNORMAL LOW (ref >60)
Glucose, Bld: 84 mg/dL (ref 70–99)
Potassium: 4 mmol/L (ref 3.5–5.1)
Sodium: 142 mmol/L (ref 135–145)
Total Bilirubin: 0.2 mg/dL (ref 0.0–1.2)
Total Protein: 7.1 g/dL (ref 6.5–8.1)

## 2024-03-26 LAB — CBC WITH DIFFERENTIAL/PLATELET
Abs Immature Granulocytes: 0.08 K/uL — ABNORMAL HIGH (ref 0.00–0.07)
Basophils Absolute: 0 K/uL (ref 0.0–0.1)
Basophils Relative: 0 %
Eosinophils Absolute: 0 K/uL (ref 0.0–0.5)
Eosinophils Relative: 0 %
HCT: 36.4 % (ref 36.0–46.0)
Hemoglobin: 11.6 g/dL — ABNORMAL LOW (ref 12.0–15.0)
Immature Granulocytes: 2 %
Lymphocytes Relative: 27 %
Lymphs Abs: 1.4 K/uL (ref 0.7–4.0)
MCH: 30.3 pg (ref 26.0–34.0)
MCHC: 31.9 g/dL (ref 30.0–36.0)
MCV: 95 fL (ref 80.0–100.0)
Monocytes Absolute: 0.3 K/uL (ref 0.1–1.0)
Monocytes Relative: 6 %
Neutro Abs: 3.5 K/uL (ref 1.7–7.7)
Neutrophils Relative %: 65 %
Platelets: 122 K/uL — ABNORMAL LOW (ref 150–400)
RBC: 3.83 MIL/uL — ABNORMAL LOW (ref 3.87–5.11)
RDW: 15.3 % (ref 11.5–15.5)
Smear Review: NORMAL
WBC: 5.3 K/uL (ref 4.0–10.5)
nRBC: 0 % (ref 0.0–0.2)

## 2024-03-26 LAB — RESP PANEL BY RT-PCR (RSV, FLU A&B, COVID)  RVPGX2
Influenza A by PCR: NEGATIVE
Influenza B by PCR: NEGATIVE
Resp Syncytial Virus by PCR: NEGATIVE
SARS Coronavirus 2 by RT PCR: NEGATIVE

## 2024-03-26 LAB — TROPONIN T, HIGH SENSITIVITY
Troponin T High Sensitivity: 40 ng/L — ABNORMAL HIGH (ref 0–19)
Troponin T High Sensitivity: 34 ng/L — ABNORMAL HIGH (ref 0–19)

## 2024-03-26 LAB — GLUCOSE, CAPILLARY
Glucose-Capillary: 112 mg/dL — ABNORMAL HIGH (ref 70–99)
Glucose-Capillary: 118 mg/dL — ABNORMAL HIGH (ref 70–99)
Glucose-Capillary: 177 mg/dL — ABNORMAL HIGH (ref 70–99)
Glucose-Capillary: 51 mg/dL — ABNORMAL LOW (ref 70–99)
Glucose-Capillary: 63 mg/dL — ABNORMAL LOW (ref 70–99)

## 2024-03-26 LAB — LACTIC ACID, PLASMA
Lactic Acid, Venous: 1.2 mmol/L (ref 0.5–1.9)
Lactic Acid, Venous: 1.3 mmol/L (ref 0.5–1.9)

## 2024-03-26 LAB — GROUP A STREP BY PCR: Group A Strep by PCR: NOT DETECTED

## 2024-03-26 SURGERY — CYSTOSCOPY, WITH RETROGRADE PYELOGRAM AND URETERAL STENT INSERTION
Anesthesia: General | Site: Ureter | Laterality: Left

## 2024-03-26 MED ORDER — CHLORHEXIDINE GLUCONATE 0.12 % MT SOLN: 15.0000 mL | Freq: Once | OROMUCOSAL | Status: AC

## 2024-03-26 MED ORDER — ONDANSETRON HCL 4 MG/2ML IJ SOLN
INTRAMUSCULAR | Status: DC | PRN
  Administered 2024-03-26: 4 mg via INTRAVENOUS

## 2024-03-26 MED ORDER — PROPOFOL 500 MG/50ML IV EMUL
INTRAVENOUS | Status: AC
  Filled 2024-03-26: qty 50

## 2024-03-26 MED ORDER — ONDANSETRON HCL 4 MG/2ML IJ SOLN: 4.0000 mg | Freq: Four times a day (QID) | INTRAMUSCULAR | Status: DC | PRN

## 2024-03-26 MED ORDER — SERTRALINE HCL 50 MG PO TABS
100.0000 mg | ORAL_TABLET | Freq: Every day | ORAL | Status: DC
Start: 2024-03-26 — End: 2024-03-27
  Administered 2024-03-26 – 2024-03-27 (×2): 100 mg via ORAL
  Filled 2024-03-26 (×2): qty 2

## 2024-03-26 MED ORDER — ACETAMINOPHEN 325 MG PO TABS: 650.0000 mg | ORAL_TABLET | Freq: Four times a day (QID) | ORAL | Status: DC | PRN

## 2024-03-26 MED ORDER — CHLORHEXIDINE GLUCONATE CLOTH 2 % EX PADS
6.0000 | MEDICATED_PAD | Freq: Every day | CUTANEOUS | Status: DC
  Administered 2024-03-27: 6 via TOPICAL

## 2024-03-26 MED ORDER — SODIUM CHLORIDE 0.9 % IR SOLN
Status: DC | PRN
  Administered 2024-03-26: 3000 mL

## 2024-03-26 MED ORDER — LIDOCAINE 2% (20 MG/ML) 5 ML SYRINGE
INTRAMUSCULAR | Status: DC | PRN
  Administered 2024-03-26: 60 mg via INTRAVENOUS

## 2024-03-26 MED ORDER — SODIUM CHLORIDE 0.9 % IV SOLN
2.0000 g | INTRAVENOUS | Status: DC
  Administered 2024-03-27: 2 g via INTRAVENOUS
  Filled 2024-03-26: qty 20

## 2024-03-26 MED ORDER — LORATADINE 10 MG PO TABS
10.0000 mg | ORAL_TABLET | Freq: Every day | ORAL | Status: DC
  Administered 2024-03-26 – 2024-03-27 (×2): 10 mg via ORAL
  Filled 2024-03-26 (×2): qty 1

## 2024-03-26 MED ORDER — ACETAMINOPHEN 650 MG RE SUPP: 650.0000 mg | Freq: Four times a day (QID) | RECTAL | Status: DC | PRN

## 2024-03-26 MED ORDER — LACTATED RINGERS IV BOLUS
1000.0000 mL | Freq: Once | INTRAVENOUS | Status: AC
  Administered 2024-03-26: 1000 mL via INTRAVENOUS

## 2024-03-26 MED ORDER — ORAL CARE MOUTH RINSE: 15.0000 mL | Freq: Once | OROMUCOSAL | Status: AC

## 2024-03-26 MED ORDER — LIDOCAINE VISCOUS HCL 2 % MT SOLN
15.0000 mL | Freq: Once | OROMUCOSAL | Status: AC
Start: 2024-03-26 — End: 2024-03-26
  Administered 2024-03-26: 15 mL via OROMUCOSAL
  Filled 2024-03-26: qty 15

## 2024-03-26 MED ORDER — LACTATED RINGERS IV SOLN: INTRAVENOUS | Status: DC

## 2024-03-26 MED ORDER — LACTATED RINGERS IV SOLN: INTRAVENOUS | Status: AC

## 2024-03-26 MED ORDER — LIDOCAINE 2% (20 MG/ML) 5 ML SYRINGE
INTRAMUSCULAR | Status: AC
  Filled 2024-03-26: qty 5

## 2024-03-26 MED ORDER — DEXTROSE 50 % IV SOLN
1.0000 | Freq: Once | INTRAVENOUS | Status: AC
  Administered 2024-03-26: 50 mL via INTRAVENOUS

## 2024-03-26 MED ORDER — ONDANSETRON HCL 4 MG/2ML IJ SOLN
INTRAMUSCULAR | Status: AC
  Filled 2024-03-26: qty 2

## 2024-03-26 MED ORDER — CEFTRIAXONE SODIUM 2 G IJ SOLR
2.0000 g | Freq: Once | INTRAMUSCULAR | Status: AC
  Administered 2024-03-26: 2 g via INTRAVENOUS
  Filled 2024-03-26: qty 20

## 2024-03-26 MED ORDER — ROSUVASTATIN CALCIUM 20 MG PO TABS
20.0000 mg | ORAL_TABLET | Freq: Every day | ORAL | Status: DC
  Administered 2024-03-26: 20 mg via ORAL
  Filled 2024-03-26: qty 1

## 2024-03-26 MED ORDER — STERILE WATER FOR IRRIGATION IR SOLN
Status: DC | PRN
  Administered 2024-03-26: 1000 mL

## 2024-03-26 MED ORDER — NYSTATIN 100000 UNIT/ML MT SUSP
5.0000 mL | Freq: Four times a day (QID) | OROMUCOSAL | Status: DC
  Administered 2024-03-26 – 2024-03-27 (×7): 500000 [IU] via ORAL
  Filled 2024-03-26 (×7): qty 5

## 2024-03-26 MED ORDER — PROPOFOL 500 MG/50ML IV EMUL
INTRAVENOUS | Status: DC | PRN
  Administered 2024-03-26: 50 mg via INTRAVENOUS
  Administered 2024-03-26: 50 ug/kg/min via INTRAVENOUS

## 2024-03-26 MED ORDER — DIATRIZOATE MEGLUMINE 30 % UR SOLN
URETHRAL | Status: AC
  Filled 2024-03-26: qty 100

## 2024-03-26 MED ORDER — CHLORHEXIDINE GLUCONATE 0.12 % MT SOLN
OROMUCOSAL | Status: AC
  Administered 2024-03-26: 15 mL via OROMUCOSAL
  Filled 2024-03-26: qty 15

## 2024-03-26 MED ORDER — CEFAZOLIN SODIUM-DEXTROSE 2-4 GM/100ML-% IV SOLN
INTRAVENOUS | Status: AC
  Filled 2024-03-26: qty 100

## 2024-03-26 MED ORDER — DIATRIZOATE MEGLUMINE 30 % UR SOLN
URETHRAL | Status: DC | PRN
  Administered 2024-03-26: 7 mL via URETHRAL

## 2024-03-26 MED ORDER — FENTANYL CITRATE (PF) 100 MCG/2ML IJ SOLN
INTRAMUSCULAR | Status: DC | PRN
  Administered 2024-03-26: 25 ug via INTRAVENOUS
  Administered 2024-03-26: 50 ug via INTRAVENOUS

## 2024-03-26 MED ORDER — FENTANYL CITRATE (PF) 100 MCG/2ML IJ SOLN
INTRAMUSCULAR | Status: AC
  Filled 2024-03-26: qty 2

## 2024-03-26 MED ORDER — LACTATED RINGERS IV SOLN: INTRAVENOUS | Status: DC | PRN

## 2024-03-26 MED ORDER — DEXTROSE 50 % IV SOLN
INTRAVENOUS | Status: AC
  Filled 2024-03-26: qty 50

## 2024-03-26 MED ORDER — FLUTICASONE PROPIONATE 50 MCG/ACT NA SUSP: 2.0000 | Freq: Every day | NASAL | Status: DC | PRN

## 2024-03-26 MED ORDER — ONDANSETRON HCL 4 MG PO TABS: 4.0000 mg | ORAL_TABLET | Freq: Four times a day (QID) | ORAL | Status: DC | PRN

## 2024-03-26 MED ORDER — GABAPENTIN 300 MG PO CAPS
300.0000 mg | ORAL_CAPSULE | Freq: Every day | ORAL | Status: DC
Start: 2024-03-26 — End: 2024-03-27
  Administered 2024-03-26 – 2024-03-27 (×2): 300 mg via ORAL
  Filled 2024-03-26 (×2): qty 1

## 2024-03-26 SURGICAL SUPPLY — 20 items
BAG DRAIN URO TABLE W/ADPT NS (BAG) ×1 IMPLANT
BAG HAMPER (MISCELLANEOUS) ×1 IMPLANT
CLOTH BEACON ORANGE TIMEOUT ST (SAFETY) ×1 IMPLANT
GLOVE BIO SURGEON STRL SZ8 (GLOVE) ×1 IMPLANT
GLOVE BIOGEL PI IND STRL 7.0 (GLOVE) ×2 IMPLANT
GOWN STRL REUS W/TWL LRG LVL3 (GOWN DISPOSABLE) ×1 IMPLANT
GOWN STRL REUS W/TWL XL LVL3 (GOWN DISPOSABLE) ×1 IMPLANT
GUIDEWIRE STR ZIPWIRE 035X150 (MISCELLANEOUS) ×1 IMPLANT
KIT TURNOVER CYSTO (KITS) ×1 IMPLANT
MANIFOLD NEPTUNE II (INSTRUMENTS) ×1 IMPLANT
PACK CYSTO (CUSTOM PROCEDURE TRAY) ×1 IMPLANT
PAD ARMBOARD POSITIONER FOAM (MISCELLANEOUS) ×1 IMPLANT
POSITIONER HEAD 8X9X4 ADT (SOFTGOODS) ×1 IMPLANT
SOL .9 NS 3000ML IRR UROMATIC (IV SOLUTION) ×1 IMPLANT
SPIKE FLUID TRANSFER (MISCELLANEOUS) ×1 IMPLANT
STENT CONTOUR 6FRX24X.038 (STENTS) IMPLANT
SYR 10ML LL (SYRINGE) ×1 IMPLANT
TOWEL OR 17X26 4PK STRL BLUE (TOWEL DISPOSABLE) ×1 IMPLANT
TRAY FOL W/BAG SLVR 16FR STRL (SET/KITS/TRAYS/PACK) IMPLANT
WATER STERILE IRR 500ML POUR (IV SOLUTION) ×1 IMPLANT

## 2024-03-26 NOTE — Op Note (Signed)
.  Preoperative diagnosis: Left ureteral stone, sepsis  Postoperative diagnosis: Same  Procedure: 1 cystoscopy 2. Left retrograde pyelography 3.  Intraoperative fluoroscopy, under one hour, with interpretation 4. Left 6 x 24 JJ stent placement  Attending: Belvie Clara  Anesthesia: General  Estimated blood loss: None  Drains: Left 6 x 24 JJ ureteral stent without tether, 16 French foley catheter  Specimens: none  Antibiotics: rocephin   Findings: left mid ureteral stone. Mild hydronephrosis. No masses/lesions in the bladder. Ureteral orifices in normal anatomic location.  Indications: Patient is a 73 year old female with a history of left ureteral stone and concern for sepsis.  After discussing treatment options, they decided proceed with left stent placement.  Procedure in detail: The patient was brought to the operating room and a brief timeout was done to ensure correct patient, correct procedure, correct site.  General anesthesia was administered patient was placed in dorsal lithotomy position.  Their genitalia was then prepped and draped in usual sterile fashion.  A rigid 22 French cystoscope was passed in the urethra and the bladder.  Bladder was inspected free masses or lesions.  the ureteral orifices were in the normal orthotopic locations.  a 6 french ureteral catheter was then instilled into the left ureteral orifice.  a gentle retrograde was obtained and findings noted above.  we then placed a zip wire through the ureteral catheter and advanced up to the renal pelvis.    We then placed a 6 x 26 double-j ureteral stent over the original zip wire.  We then removed the wire and good coil was noted in the the renal pelvis under fluoroscopy and the bladder under direct vision.  A foley catheter was then placed. the bladder was then drained and this concluded the procedure which was well tolerated by patient.  Complications: None  Condition: Stable, extubated, transferred to  PACU  Plan: Patient is to be admitted for IV antibiotics. He will have his stone extraction in 2 weeks.

## 2024-03-26 NOTE — Consult Note (Signed)
 Urology Consult  Referring physician: Dr. Maree Reason for referral: left ureteral stones  Chief Complaint: left flank pain  History of Present Illness: Ms Allison Oneill is a 73yo with a history of nephrolithiasis who presented to the ER with a 2 day history of worsening left flank pain, fever and falls. She started having mild left flank pain 1 week ago but yesterday it intensified. She fell last night and this morning. She started have chills and low grade fevers yesterday. She has been having increased urinary frequency, urgency, and dysuria for 3 days. CT from today shows multiple left mid and distal ureteral calculi with mild to moderate hydronephrosis. Her flank pain is sharp, constant, mild to moderate and nonradiating. She has been having nausea and vomiting for 2 days  Past Medical History:  Diagnosis Date   Anxiety    Arthritis    Diabetes mellitus without complication (HCC)    TYPE 2   Fibromyalgia    History of kidney stones    Hypertension    Left renal stone    Neuromuscular disorder (HCC)    restless leg syndrome   Neuropathy    FEET   Past Surgical History:  Procedure Laterality Date   ABDOMINAL HYSTERECTOMY  2000   COMPLETE   CYSTOSCOPY N/A 08/27/2013   Procedure: CYSTOSCOPY; REMOVAL OF BLADDER CALCULUS;  Surgeon: Allison LILLETTE Blaze, MD;  Location: [facility];  Service: Urology;  Laterality: N/A;   CYSTOSCOPY W/ URETERAL STENT PLACEMENT N/A 2012   CYSTOSCOPY W/ URETERAL STENT REMOVAL N/A 2013   EXTRACORPOREAL SHOCK WAVE LITHOTRIPSY Left 03/05/2023   Procedure: EXTRACORPOREAL SHOCK WAVE LITHOTRIPSY (ESWL);  Surgeon: Matilda Senior, MD;  Location: [facility];  Service: Urology;  Laterality: Left;   IR URETERAL STENT LEFT NEW ACCESS W/O SEP NEPHROSTOMY CATH  08/07/2018   IR URETERAL STENT LEFT NEW ACCESS W/O SEP NEPHROSTOMY CATH  08/03/2021   LITHOTRIPSY  2012   NEPHROLITHOTOMY Left 08/07/2018   Procedure: NEPHROLITHOTOMY PERCUTANEOUS;  Surgeon: Matilda Senior, MD;  Location: [facility];  Service: Urology;  Laterality: Left;   NEPHROLITHOTOMY Left 08/03/2021   Procedure: NEPHROLITHOTOMY PERCUTANEOUS;  Surgeon: Matilda Senior, MD;  Location: [facility];  Service: Urology;  Laterality: Left;   NEUROMA SURGERY Left 1997   PARATHYROIDECTOMY Right 02/27/2019   Procedure: RIGHT INFERIOR PARATHYROIDECTOMY;  Surgeon: Eletha Boas, MD;  Location: [facility];  Service: General;  Laterality: Right;    Medications: I have reviewed the patient's current medications. Allergies:  Allergies  Allergen Reactions   Alfuzosin  Other (See Comments)    Made her feel dizzy and out of it   Sulfa Antibiotics Itching and Rash    History reviewed. No pertinent family history. Social History:  reports that she has never smoked. She has never used smokeless tobacco. She reports that she does not drink alcohol and does not use drugs.  Review of Systems  Constitutional:  Positive for chills, fatigue and fever.  Genitourinary:  Positive for dysuria, flank pain, frequency and urgency.  All other systems reviewed and are negative.   Physical Exam:  Vital signs in last 24 hours: Temp:  [99.2 F (37.3 C)] 99.2 F (37.3 C) (10/23 0625) Pulse Rate:  [66-88] 70 (10/23 1100) Resp:  [14-22] 14 (10/23 1100) BP: (80-105)/(45-65) 105/52 (10/23 1100) SpO2:  [89 %-99 %] 97 % (10/23 1100) Weight:  [68 kg] 68 kg (10/23 9376) Physical Exam Vitals reviewed.  Constitutional:      Appearance: Normal appearance.  HENT:     Head: Normocephalic  and atraumatic.     Nose: Nose normal. No congestion.     Mouth/Throat:     Mouth: Mucous membranes are dry.  Eyes:     Extraocular Movements: Extraocular movements intact.     Conjunctiva/sclera: Conjunctivae normal.     Pupils: Pupils are equal, round, and reactive to light.  Cardiovascular:     Rate and Rhythm: Normal rate and regular rhythm.  Pulmonary:     Effort: Pulmonary effort is normal. No respiratory distress.  Abdominal:     General: Abdomen is  flat. There is no distension.  Musculoskeletal:        General: No swelling. Normal range of motion.     Cervical back: Normal range of motion and neck supple.  Skin:    General: Skin is warm and dry.  Neurological:     General: No focal deficit present.     Mental Status: She is alert and oriented to person, place, and time.  Psychiatric:        Mood and Affect: Mood normal.        Behavior: Behavior normal.        Thought Content: Thought content normal.        Judgment: Judgment normal.     Laboratory Data:  Results for orders placed or performed during the hospital encounter of 03/26/24 (from the past 72 hours)  Group A Strep by PCR     Status: None   Collection Time: 03/26/24  6:44 AM   Specimen: Throat; Sterile Swab  Result Value Ref Range   Group A Strep by PCR NOT DETECTED NOT DETECTED    Comment: Performed at [facility], [address], [place], [place] [address]  Resp panel by RT-PCR (RSV, Flu A&B, Covid) Throat     Status: None   Collection Time: 03/26/24  6:44 AM   Specimen: Throat; Nasal Swab  Result Value Ref Range   SARS Coronavirus 2 by RT PCR NEGATIVE NEGATIVE    Comment: (NOTE) SARS-CoV-2 target nucleic acids are NOT DETECTED.  The SARS-CoV-2 RNA is generally detectable in upper respiratory specimens during the acute phase of infection. The lowest concentration of SARS-CoV-2 viral copies this assay can detect is 138 copies/mL. A negative result does not preclude SARS-Cov-2 infection and should not be used as the sole basis for treatment or other patient management decisions. A negative result may occur with  improper specimen collection/handling, submission of specimen other than nasopharyngeal swab, presence of viral mutation(s) within the areas targeted by this assay, and inadequate number of viral copies(<138 copies/mL). A negative result must be combined with clinical observations, patient history, and epidemiological information. The expected result  is Negative.  Fact Sheet for Patients:  BloggerCourse.com  Fact Sheet for Healthcare Providers:  SeriousBroker.it  This test is no t yet approved or cleared by the [place]  FDA and  has been authorized for detection and/or diagnosis of SARS-CoV-2 by FDA under an Emergency Use Authorization (EUA). This EUA will remain  in effect (meaning this test can be used) for the duration of the COVID-19 declaration under Section 564(b)(1) of the Act, 21 U.S.C.section 360bbb-3(b)(1), unless the authorization is terminated  or revoked sooner.       Influenza A by PCR NEGATIVE NEGATIVE   Influenza B by PCR NEGATIVE NEGATIVE    Comment: (NOTE) The Xpert Xpress SARS-CoV-2/FLU/RSV plus assay is intended as an aid in the diagnosis of influenza from Nasopharyngeal swab specimens and should not be used as a sole  basis for treatment. Nasal washings and aspirates are unacceptable for Xpert Xpress SARS-CoV-2/FLU/RSV testing.  Fact Sheet for Patients: BloggerCourse.com  Fact Sheet for Healthcare Providers: SeriousBroker.it  This test is not yet approved or cleared by the [place]  FDA and has been authorized for detection and/or diagnosis of SARS-CoV-2 by FDA under an Emergency Use Authorization (EUA). This EUA will remain in effect (meaning this test can be used) for the duration of the COVID-19 declaration under Section 564(b)(1) of the Act, 21 U.S.C. section 360bbb-3(b)(1), unless the authorization is terminated or revoked.     Resp Syncytial Virus by PCR NEGATIVE NEGATIVE    Comment: (NOTE) Fact Sheet for Patients: BloggerCourse.com  Fact Sheet for Healthcare Providers: SeriousBroker.it  This test is not yet approved or cleared by the [place]  FDA and has been authorized for detection and/or diagnosis of SARS-CoV-2 by FDA under  an Emergency Use Authorization (EUA). This EUA will remain in effect (meaning this test can be used) for the duration of the COVID-19 declaration under Section 564(b)(1) of the Act, 21 U.S.C. section 360bbb-3(b)(1), unless the authorization is terminated or revoked.  Performed at [facility], [address] [place], [place] [address]   CBC with Differential     Status: Abnormal   Collection Time: 03/26/24  7:04 AM  Result Value Ref Range   WBC 5.3 4.0 - 10.5 K/uL   RBC 3.83 (L) 3.87 - 5.11 MIL/uL   Hemoglobin 11.6 (L) 12.0 - 15.0 g/dL   HCT 63.5 63.9 - 53.9 %   MCV 95.0 80.0 - 100.0 fL   MCH 30.3 26.0 - 34.0 pg   MCHC 31.9 30.0 - 36.0 g/dL   RDW 84.6 88.4 - 84.4 %   Platelets 122 (L) 150 - 400 K/uL    Comment: REPEATED TO VERIFY   nRBC 0.0 0.0 - 0.2 %   Neutrophils Relative % 65 %   Neutro Abs 3.5 1.7 - 7.7 K/uL   Lymphocytes Relative 27 %   Lymphs Abs 1.4 0.7 - 4.0 K/uL   Monocytes Relative 6 %   Monocytes Absolute 0.3 0.1 - 1.0 K/uL   Eosinophils Relative 0 %   Eosinophils Absolute 0.0 0.0 - 0.5 K/uL   Basophils Relative 0 %   Basophils Absolute 0.0 0.0 - 0.1 K/uL   Smear Review Normal platelet morphology    Immature Granulocytes 2 %   Abs Immature Granulocytes 0.08 (H) 0.00 - 0.07 K/uL   Large Granular Lymphocytes PRESENT    Reactive, Benign Lymphocytes PRESENT    Ovalocytes PRESENT     Comment: Performed at [facility], [address], [place], [place] [address]  Comprehensive metabolic panel     Status: Abnormal   Collection Time: 03/26/24  7:04 AM  Result Value Ref Range   Sodium 142 135 - 145 mmol/L   Potassium 4.0 3.5 - 5.1 mmol/L   Chloride 103 98 - 111 mmol/L   CO2 24 22 - 32 mmol/L   Glucose, Bld 84 70 - 99 mg/dL    Comment: Glucose reference range applies only to samples taken after fasting for at least 8 hours.   BUN 54 (H) 8 - 23 mg/dL   Creatinine, Ser 7.85 (H) 0.44 - 1.00 mg/dL   Calcium  8.5 (L) 8.9 - 10.3 mg/dL   Total Protein 7.1 6.5 - 8.1 g/dL    Albumin 4.0 3.5 - 5.0 g/dL   AST 75 (H) 15 - 41 U/L   ALT 35 0 - 44 U/L  Alkaline Phosphatase 63 38 - 126 U/L   Total Bilirubin 0.2 0.0 - 1.2 mg/dL   GFR, Estimated 23 (L) >60 mL/min    Comment: (NOTE) Calculated using the CKD-EPI Creatinine Equation (2021)    Anion gap 14 5 - 15    Comment: Performed at [facility], [address] [place], [place] [address]  Lactic acid, plasma     Status: None   Collection Time: 03/26/24  7:04 AM  Result Value Ref Range   Lactic Acid, Venous 1.2 0.5 - 1.9 mmol/L    Comment: Performed at [facility], [address] [place], [place] [address]  Troponin T, High Sensitivity     Status: Abnormal   Collection Time: 03/26/24  7:04 AM  Result Value Ref Range   Troponin T High Sensitivity 40 (H) 0 - 19 ng/L    Comment: (NOTE) Biotin concentrations > 1000 ng/mL falsely decrease TnT results.  Serial cardiac troponin measurements are suggested.  Refer to the Links section for chest pain algorithms and additional  guidance. Performed at [facility], [address] [place], [place] [address]   Troponin T, High Sensitivity     Status: Abnormal   Collection Time: 03/26/24  8:12 AM  Result Value Ref Range   Troponin T High Sensitivity 34 (H) 0 - 19 ng/L    Comment: (NOTE) Biotin concentrations > 1000 ng/mL falsely decrease TnT results.  Serial cardiac troponin measurements are suggested.  Refer to the Links section for chest pain algorithms and additional  guidance. Performed at [facility], [address], [place], [place] [address]   Lactic acid, plasma     Status: None   Collection Time: 03/26/24  8:14 AM  Result Value Ref Range   Lactic Acid, Venous 1.3 0.5 - 1.9 mmol/L    Comment: Performed at [facility], [address] [place], [place] [address]  Urinalysis, w/ Reflex to Culture (Infection Suspected) -Urine, Unspecified Source     Status: Abnormal   Collection Time: 03/26/24 10:05 AM  Result Value Ref Range   Specimen Source URINE, UNSPE     Color, Urine YELLOW YELLOW   APPearance HAZY (A) CLEAR   Specific Gravity, Urine 1.011 1.005 - 1.030   pH 5.0 5.0 - 8.0   Glucose, UA >=500 (A) NEGATIVE mg/dL   Hgb urine dipstick SMALL (A) NEGATIVE   Bilirubin Urine NEGATIVE NEGATIVE   Ketones, ur NEGATIVE NEGATIVE mg/dL   Protein, ur 30 (A) NEGATIVE mg/dL   Nitrite NEGATIVE NEGATIVE   Leukocytes,Ua NEGATIVE NEGATIVE   RBC / HPF 0-5 0 - 5 RBC/hpf   WBC, UA 0-5 0 - 5 WBC/hpf    Comment:        Reflex urine culture not performed if WBC <=10, OR if Squamous epithelial cells >5. If Squamous epithelial cells >5 suggest recollection.    Bacteria, UA RARE (A) NONE SEEN   Squamous Epithelial / HPF 0-5 0 - 5 /HPF   Mucus PRESENT     Comment: Performed at [facility], [address], [place], [place] [address]   Recent Results (from the past 240 hours)  Group A Strep by PCR     Status: None   Collection Time: 03/26/24  6:44 AM   Specimen: Throat; Sterile Swab  Result Value Ref Range Status   Group A Strep by PCR NOT DETECTED NOT DETECTED Final    Comment: Performed at [facility], [address] [place], [place] [address]  Resp panel by RT-PCR (RSV, Flu  A&B, Covid) Throat     Status: None   Collection Time: 03/26/24  6:44 AM   Specimen: Throat; Nasal Swab  Result Value Ref Range Status   SARS Coronavirus 2 by RT PCR NEGATIVE NEGATIVE Final    Comment: (NOTE) SARS-CoV-2 target nucleic acids are NOT DETECTED.  The SARS-CoV-2 RNA is generally detectable in upper respiratory specimens during the acute phase of infection. The lowest concentration of SARS-CoV-2 viral copies this assay can detect is 138 copies/mL. A negative result does not preclude SARS-Cov-2 infection and should not be used as the sole basis for treatment or other patient management decisions. A negative result may occur with  improper specimen collection/handling, submission of specimen other than nasopharyngeal swab, presence of viral mutation(s) within  the areas targeted by this assay, and inadequate number of viral copies(<138 copies/mL). A negative result must be combined with clinical observations, patient history, and epidemiological information. The expected result is Negative.  Fact Sheet for Patients:  BloggerCourse.com  Fact Sheet for Healthcare Providers:  SeriousBroker.it  This test is no t yet approved or cleared by the [place]  FDA and  has been authorized for detection and/or diagnosis of SARS-CoV-2 by FDA under an Emergency Use Authorization (EUA). This EUA will remain  in effect (meaning this test can be used) for the duration of the COVID-19 declaration under Section 564(b)(1) of the Act, 21 U.S.C.section 360bbb-3(b)(1), unless the authorization is terminated  or revoked sooner.       Influenza A by PCR NEGATIVE NEGATIVE Final   Influenza B by PCR NEGATIVE NEGATIVE Final    Comment: (NOTE) The Xpert Xpress SARS-CoV-2/FLU/RSV plus assay is intended as an aid in the diagnosis of influenza from Nasopharyngeal swab specimens and should not be used as a sole basis for treatment. Nasal washings and aspirates are unacceptable for Xpert Xpress SARS-CoV-2/FLU/RSV testing.  Fact Sheet for Patients: BloggerCourse.com  Fact Sheet for Healthcare Providers: SeriousBroker.it  This test is not yet approved or cleared by the [place]  FDA and has been authorized for detection and/or diagnosis of SARS-CoV-2 by FDA under an Emergency Use Authorization (EUA). This EUA will remain in effect (meaning this test can be used) for the duration of the COVID-19 declaration under Section 564(b)(1) of the Act, 21 U.S.C. section 360bbb-3(b)(1), unless the authorization is terminated or revoked.     Resp Syncytial Virus by PCR NEGATIVE NEGATIVE Final    Comment: (NOTE) Fact Sheet for  Patients: BloggerCourse.com  Fact Sheet for Healthcare Providers: SeriousBroker.it  This test is not yet approved or cleared by the [place]  FDA and has been authorized for detection and/or diagnosis of SARS-CoV-2 by FDA under an Emergency Use Authorization (EUA). This EUA will remain in effect (meaning this test can be used) for the duration of the COVID-19 declaration under Section 564(b)(1) of the Act, 21 U.S.C. section 360bbb-3(b)(1), unless the authorization is terminated or revoked.  Performed at [facility], [address] [place], [place] [address]    Creatinine: Recent Labs    03/26/24 0704  CREATININE 2.14*   Baseline Creatinine: 1.2  Impression/Assessment:  73yo with left ureteral calculus, concern for sepsis  Plan:  -We discussed the management of kidney stones. These options include observation, ureteroscopy, shockwave lithotripsy (ESWL) and percutaneous nephrolithotomy (PCNL). We discussed which options are relevant to the patient's stone(s). We discussed the natural history of kidney stones as well as the complications of untreated stones and the impact on quality of life without treatment as well as with  each of the above listed treatments. We also discussed the efficacy of each treatment in its ability to clear the stone burden. With any of these management options I discussed the signs and symptoms of infection and the need for emergent treatment should these be experienced. For each option we discussed the ability of each procedure to clear the patient of their stone burden.   For observation I described the risks which include but are not limited to silent renal damage, life-threatening infection, need for emergent surgery, failure to pass stone and pain.   For ureteroscopy I described the risks which include bleeding, infection, damage to contiguous structures, positioning injury, ureteral stricture, ureteral  avulsion, ureteral injury, need for prolonged ureteral stent, inability to perform ureteroscopy, need for an interval procedure, inability to clear stone burden, stent discomfort/pain, heart attack, stroke, pulmonary embolus and the inherent risks with general anesthesia.   For shockwave lithotripsy I described the risks which include arrhythmia, kidney contusion, kidney hemorrhage, need for transfusion, pain, inability to adequately break up stone, inability to pass stone fragments, Steinstrasse, infection associated with obstructing stones, need for alternate surgical procedure, need for repeat shockwave lithotripsy, MI, CVA, PE and the inherent risks with anesthesia/conscious sedation.   For PCNL I described the risks including positioning injury, pneumothorax, hydrothorax, need for chest tube, inability to clear stone burden, renal laceration, arterial venous fistula or malformation, need for embolization of kidney, loss of kidney or renal function, need for repeat procedure, need for prolonged nephrostomy tube, ureteral avulsion, MI, CVA, PE and the inherent risks of general anesthesia.   - The patient would like to proceed with left ureteral stent placement  Allison Oneill 03/26/2024, 1:30 PM

## 2024-03-26 NOTE — TOC CM/SW Note (Signed)
 Transition of Care [facility]) - Inpatient Brief Assessment   Patient Details  Name: Allison Oneill MRN: [id number] Date of Birth: 1950-11-16  Transition of Care [facility]) CM/SW Contact:    Noreen KATHEE Cleotilde ISRAEL Phone Number: 03/26/2024, 12:25 PM   Clinical Narrative:  Inpatient Care Management (ICM) has reviewed patient and no other ICM needs have been identified at this time. We will continue to monitor patient advancement through interdisciplinary progression rounds. If new patient transition needs arise, please place a ICM consult.  Transition of Care Asessment: Insurance and Status: Insurance coverage has been reviewed Patient has primary care physician: Yes Home environment has been reviewed: Single Family Home Prior level of function:: Independent Prior/Current Home Services: No current home services Social Drivers of Health Review: SDOH reviewed no interventions necessary Readmission risk has been reviewed: Yes Transition of care needs: no transition of care needs at this time

## 2024-03-26 NOTE — ED Notes (Signed)
 Went to try and get urine sample but pt is asleep. RN made aware and will try again shortly

## 2024-03-26 NOTE — Anesthesia Preprocedure Evaluation (Signed)
 Anesthesia Evaluation  Patient identified by MRN, date of birth, ID band Patient awake    Reviewed: Allergy & Precautions, H&P , NPO status , Patient's Chart, lab work & pertinent test results, reviewed documented beta blocker date and time   Airway Mallampati: II  TM Distance: >3 FB Neck ROM: full    Dental no notable dental hx.    Pulmonary neg pulmonary ROS   Pulmonary exam normal breath sounds clear to auscultation       Cardiovascular Exercise Tolerance: Good hypertension,  Rhythm:regular Rate:Normal     Neuro/Psych Seizures -,   Anxiety      Neuromuscular disease CVA  negative psych ROS   GI/Hepatic negative GI ROS, Neg liver ROS,,,  Endo/Other  diabetes    Renal/GU Renal disease  negative genitourinary   Musculoskeletal   Abdominal   Peds  Hematology negative hematology ROS (+)   Anesthesia Other Findings   Reproductive/Obstetrics negative OB ROS                              Anesthesia Physical Anesthesia Plan  ASA: 3  Anesthesia Plan: General   Post-op Pain Management:    Induction:   PONV Risk Score and Plan: Propofol  infusion  Airway Management Planned:   Additional Equipment:   Intra-op Plan:   Post-operative Plan:   Informed Consent: I have reviewed the patients History and Physical, chart, labs and discussed the procedure including the risks, benefits and alternatives for the proposed anesthesia with the patient or authorized representative who has indicated his/her understanding and acceptance.     Dental Advisory Given  Plan Discussed with: CRNA  Anesthesia Plan Comments:         Anesthesia Quick Evaluation

## 2024-03-26 NOTE — Significant Event (Signed)
       CROSS COVER NOTE  NAME: MAHLIA FERNANDO MRN: [id number] DOB : 1950-12-31 ATTENDING PHYSICIAN: Maree Bracken D, DO    Date of Service   03/26/2024   HPI/Events of Note   TRH cross cover for [facility] from RN received regarding 2 hypoglycemic episodes requiring D50  Latest Reference Range & Units 03/26/24 15:48 03/26/24 16:06 03/26/24 16:32 03/26/24 20:34  Glucose-Capillary 70 - 99 mg/dL 63 (L) 51 (L) 822 (H) 887 (H)  (L): Data is abnormally low (H): Data is abnormally high  HPI 73 year old female with history of T2DM on jardiance, metformin and glipizide for control. She was admitted earlier today with AKI on CKD in setting of left ureteral obstructive uropathy (s/p stent placement with urology around 3 pm). today) Patient has been NPO today until after procedure. Last dose glipizide last evening , metformin yesterday evening and jardiance yesterday AM.  Her diet has been resumed and she is on IV fluids for her AKI. Anithypertensives and oral diabetes management meds have been held   Interventions   Assessment/Plan: High risk for hypoglycemic events for next 48 hours likely Cbg monitoring every 5h Nurse to notify provider of 2 consecutive cbg above 190 to initiate SSI      Keymari Sato L Mickael Mcnutt NP [facility] Cross Cover 7pm-7am - check amion for availability Pager [phone]

## 2024-03-26 NOTE — Plan of Care (Signed)

## 2024-03-26 NOTE — ED Notes (Signed)
 Patient transported to CT

## 2024-03-26 NOTE — ED Provider Notes (Signed)
 [facility] Provider Note   CSN: [id number] Arrival date & time: 03/26/24  0600     Patient presents with: Weakness   Allison Oneill is a 73 y.o. female.  {Add pertinent medical, surgical, social history, OB history to YEP:67052} presenting with a sore throat and balance issues. Her symptoms began last Wednesday after starting Jardiance for diabetes management. The sore throat has progressively worsened, described as scratchy and burning, and is associated with difficulty swallowing. She reports a history of kidney stones, with the most recent episode occurring last week, which resolved by Friday evening. The patient also experiences nausea and has had a low-grade fever, peaking at 101F last week and currently at 20F. She denies any urinary symptoms, diarrhea, or constipation. The patient reports feeling off-balance and weak, requiring assistance to walk and having fallen in the bathroom this morning. She has a history of type 2 diabetes and high cholesterol. The history was obtained from the patient and her son but limited.   Weakness      Prior to Admission medications   Medication Sig Start Date End Date Taking? Authorizing Provider  ACCU-CHEK GUIDE test strip daily. 01/29/20   [provider]  amLODipine  (NORVASC ) 10 MG tablet Take 1 tablet (10 mg total) by mouth daily. 07/11/22   Ilah Corean HERO, PA-C  Blood Glucose Monitoring Suppl (ACCU-CHEK GUIDE ME) w/Device KIT daily. 01/29/20   [provider]  fexofenadine (ALLEGRA) 180 MG tablet Take 180 mg by mouth at bedtime.     [provider]  fluticasone (FLONASE) 50 MCG/ACT nasal spray Place 2 sprays into both nostrils daily as needed for allergies. 05/03/21   [provider]  FT VITAMIN B-12 PR 1000 MCG TBCR TAKE 1 TABLET BY MOUTH DAILY 05/14/23   Gregg Lek, MD  gabapentin  (NEURONTIN ) 300 MG capsule Take 300 mg by mouth daily.    [provider]  glipiZIDE (GLUCOTROL XL) 10 MG 24 hr tablet Take 10 mg by mouth in the morning and at bedtime. 06/08/18   [provider]  Lancets Misc. (ACCU-CHEK FASTCLIX LANCET) KIT daily. 01/29/20   [provider]  lisinopril  (PRINIVIL ,ZESTRIL ) 5 MG tablet Take 5 mg by mouth every morning. 06/08/18   [provider]  metFORMIN (GLUCOPHAGE-XR) 500 MG 24 hr tablet Take 500 mg by mouth in the morning and at bedtime. 06/08/18   [provider]  rosuvastatin  (CRESTOR ) 20 MG tablet Take 20 mg by mouth at bedtime. 04/15/20   [provider]  sertraline  (ZOLOFT ) 100 MG tablet Take 100 mg by mouth daily.    [provider]    Allergies: Alfuzosin  and Sulfa antibiotics    Review of Systems  Neurological:  Positive for weakness.    Updated Vital Signs BP (!) 101/55   Pulse 83   Temp 99.2 F (37.3 C) (Oral)   Resp 16   Ht 5' 5 (1.651 m)   Wt 68 kg   SpO2 96%   BMI 24.96 kg/m   Physical Exam Vitals and nursing note reviewed.  Constitutional:      Appearance: She is well-developed.  HENT:     Head: Normocephalic and atraumatic.     Mouth/Throat:      Comments: Beefy red posterior oropharynx with smaller lesions anteriorly Cardiovascular:     Rate and Rhythm: Normal rate and regular rhythm.  Pulmonary:     Effort: No respiratory distress.     Breath sounds: No stridor.  Abdominal:     General: There is no distension.  Musculoskeletal:     Cervical back: Normal range of motion.  Skin:    General: Skin is warm and dry.  Neurological:     Mental Status: She is alert.     (all labs ordered are listed, but only abnormal results are displayed) Labs Reviewed  GROUP A STREP BY PCR  RESP PANEL BY RT-PCR (RSV, FLU A&B, COVID)  [id number]  CULTURE, BLOOD (ROUTINE X 2)  CULTURE, BLOOD (ROUTINE X 2)  CBC WITH DIFFERENTIAL/PLATELET  COMPREHENSIVE METABOLIC PANEL WITH GFR  LACTIC ACID, PLASMA  LACTIC ACID, PLASMA  TROPONIN T, HIGH  SENSITIVITY    EKG: EKG Interpretation Date/Time:  Thursday March 26 2024 93:71:45 EDT Ventricular Rate:  89 PR Interval:  149 QRS Duration:  131 QT Interval:  404 QTC Calculation: 492 R Axis:   85  Text Interpretation: Sinus rhythm Right bundle branch block Confirmed by Lorette Mayo [phone]) on 03/26/2024 6:54:24 AM  Radiology: No results found.  {Document cardiac monitor, telemetry assessment procedure when appropriate:32947} Procedures   Medications Ordered in the ED  lactated ringers  bolus 1,000 mL (1,000 mLs Intravenous New Bag/Given 03/26/24 0658)  lidocaine  (XYLOCAINE ) 2 % viscous mouth solution 15 mL (15 mLs Mouth/Throat Given 03/26/24 9287)      {Click here for ABCD2, HEART and other calculators REFRESH Note before signing:1}                              Medical Decision Making Amount and/or Complexity of Data Reviewed Labs: ordered. Radiology: ordered.  Risk Prescription drug management.   Yeast vs strep - will swab Weakness vs balance - eval for hypovolemia, infection, metabolic/endocrine causes.  Will start fluids.  Hypoxic on RA - not baseline.   {Document critical care time when appropriate  Document review of labs and clinical decision tools ie CHADS2VASC2, etc  Document your independent review of radiology images and any outside records  Document your discussion with family members, caretakers and with consultants  Document social determinants of health affecting pt's care  Document your decision making why or why not admission, treatments were needed:32947:::1}   Final diagnoses:  None    ED Discharge Orders     None

## 2024-03-26 NOTE — Transfer of Care (Signed)
 Immediate Anesthesia Transfer of Care Note  Patient: Allison Oneill  Procedure(s) Performed: CYSTOSCOPY, WITH RETROGRADE PYELOGRAM AND URETERAL STENT INSERTION (Left: Ureter)  Patient Location: PACU  Anesthesia Type:General  Level of Consciousness: awake  Airway & Oxygen Therapy: Patient Spontanous Breathing and Patient connected to nasal cannula oxygen  Post-op Assessment: Report given to RN and Post -op Vital signs reviewed and stable  Post vital signs: Reviewed and stable  Last Vitals:  Vitals Value Taken Time  BP 101/55   Temp 98.2   Pulse 73   Resp 14   SpO2 92%     Last Pain:  Vitals:   03/26/24 1443  TempSrc:   PainSc: 0-No pain         Complications: No notable events documented.

## 2024-03-26 NOTE — ED Notes (Signed)
 Md pickering notified of bp 80/46. Pt is receiving lr bolus

## 2024-03-26 NOTE — ED Notes (Signed)
 Due to pts vitals and being on O2 due to low stats, this tech placed a purewick on pt. RN made aware. Pt was repositioned. Call light in reach and educated on purewick.

## 2024-03-26 NOTE — ED Provider Notes (Addendum)
  Physical Exam  BP (!) 105/52   Pulse 70   Temp 99.2 F (37.3 C) (Oral)   Resp 14   Ht 5' 5 (1.651 m)   Wt 68 kg   SpO2 97%   BMI 24.96 kg/m   Physical Exam  Procedures  Procedures  ED Course / MDM    Medical Decision Making Amount and/or Complexity of Data Reviewed Labs: ordered. Radiology: ordered.  Risk Prescription drug management. Decision regarding hospitalization.   Received a note.  Hypotension.  Some hypoxia.  Sore throat.  Fevers at home.  Recent kidney stone.  Does have acute kidney injury.  Lactic acid reassuring and white count reassuring.  However does have potential obstructing stone with infection.  Negative strep test and chest x-ray does not show pneumonia.  Antibiotics given to cover possible UTI.  Discussed with Dr. Nieves who was shown to be on-call.  Plan for possible stent today or tomorrow.  He reportedly talked to Dr. Sherrilee however who is on-call during the day.  Will need to be n.p.o. for now.  Will discuss with hospitalist for admission.  CRITICAL CARE Performed by: [place] Total critical care time: 30 minutes Critical care time was exclusive of separately billable procedures and treating other patients. Critical care was necessary to treat or prevent imminent or life-threatening deterioration. Critical care was time spent personally by me on the following activities: development of treatment plan with patient and/or surrogate as well as nursing, discussions with consultants, evaluation of patient's response to treatment, examination of patient, obtaining history from patient or surrogate, ordering and performing treatments and interventions, ordering and review of laboratory studies, ordering and review of radiographic studies, pulse oximetry and re-evaluation of patient's condition.        River Rankin, MD 03/26/24 1204    River Rankin, MD 03/26/24 [phone]

## 2024-03-26 NOTE — H&P (Signed)
 History and Physical    Allison Oneill FMW:[id number] DOB: 1950-07-15 DOA: 03/26/2024  PCP: Lari Elspeth BRAVO, MD   Patient coming from: Home  Chief Complaint: Flank pain/fever/nausea  HPI: Allison Oneill is a 73 y.o. female with medical history significant for hypertension, type 2 diabetes, CKD stage III AA, sciatica, and urolithiasis who presented to the ED with history of worsening left-sided flank pain, fevers, weakness, and falls over the last 2 days.  She was also noted to have some nausea, but no vomiting and states that she has had similar symptoms when she has had prior nephrolithiasis issues.  She fell last night and this morning but denies any injuries.  Flank pain is described as sharp and constant and is overall nonradiating.   ED Course: Vital signs demonstrate some soft blood pressure readings, but are otherwise stable with no fever noted here.  Hemoglobin 11.6, platelets 122,000 and creatinine 2.14.  CT head and chest x-ray with no acute findings.  Troponin of 40 and then 34.  CT demonstrates multiple left mid and distal ureteral calculi with mild to moderate hydronephrosis.  Urology contacted by EDP with plans for stent placement today.  Patient has been started on some Rocephin  and given IV fluids.  Review of Systems: Reviewed as noted above, otherwise negative.  Past Medical History:  Diagnosis Date   Anxiety    Arthritis    Diabetes mellitus without complication (HCC)    TYPE 2   Fibromyalgia    History of kidney stones    Hypertension    Left renal stone    Neuromuscular disorder (HCC)    restless leg syndrome   Neuropathy    FEET    Past Surgical History:  Procedure Laterality Date   ABDOMINAL HYSTERECTOMY  2000   COMPLETE   CYSTOSCOPY N/A 08/27/2013   Procedure: CYSTOSCOPY; REMOVAL OF BLADDER CALCULUS;  Surgeon: Emery LILLETTE Blaze, MD;  Location: [facility];  Service: Urology;  Laterality: N/A;   CYSTOSCOPY W/ URETERAL STENT PLACEMENT N/A 2012   CYSTOSCOPY  W/ URETERAL STENT REMOVAL N/A 2013   EXTRACORPOREAL SHOCK WAVE LITHOTRIPSY Left 03/05/2023   Procedure: EXTRACORPOREAL SHOCK WAVE LITHOTRIPSY (ESWL);  Surgeon: Matilda Senior, MD;  Location: [facility];  Service: Urology;  Laterality: Left;   IR URETERAL STENT LEFT NEW ACCESS W/O SEP NEPHROSTOMY CATH  08/07/2018   IR URETERAL STENT LEFT NEW ACCESS W/O SEP NEPHROSTOMY CATH  08/03/2021   LITHOTRIPSY  2012   NEPHROLITHOTOMY Left 08/07/2018   Procedure: NEPHROLITHOTOMY PERCUTANEOUS;  Surgeon: Matilda Senior, MD;  Location: [facility];  Service: Urology;  Laterality: Left;   NEPHROLITHOTOMY Left 08/03/2021   Procedure: NEPHROLITHOTOMY PERCUTANEOUS;  Surgeon: Matilda Senior, MD;  Location: [facility];  Service: Urology;  Laterality: Left;   NEUROMA SURGERY Left 1997   PARATHYROIDECTOMY Right 02/27/2019   Procedure: RIGHT INFERIOR PARATHYROIDECTOMY;  Surgeon: Eletha Boas, MD;  Location: [facility];  Service: General;  Laterality: Right;     reports that she has never smoked. She has never used smokeless tobacco. She reports that she does not drink alcohol and does not use drugs.  Allergies  Allergen Reactions   Alfuzosin  Other (See Comments)    Made her feel dizzy and out of it   Sulfa Antibiotics Itching and Rash    History reviewed. No pertinent family history.  Prior to Admission medications   Medication Sig Start Date End Date Taking? Authorizing Provider  fexofenadine (ALLEGRA) 180 MG tablet Take 180 mg by mouth at bedtime.  Yes [provider]  fluticasone (FLONASE) 50 MCG/ACT nasal spray Place 2 sprays into both nostrils daily as needed for allergies. 05/03/21  Yes [provider]  FT VITAMIN B-12 PR 1000 MCG TBCR TAKE 1 TABLET BY MOUTH DAILY 05/14/23  Yes Camara, Amadou, MD  gabapentin  (NEURONTIN ) 300 MG capsule Take 300 mg by mouth daily.   Yes [provider]  glipiZIDE (GLUCOTROL XL) 10 MG 24 hr tablet Take 10 mg by mouth in the morning and at bedtime. 06/08/18   Yes [provider]  hydrochlorothiazide  (HYDRODIURIL ) 12.5 MG tablet Take 12.5 mg by mouth daily. 01/16/24  Yes [provider]  hydrocortisone 2.5 % cream Apply 1 Application topically. 07/07/15  Yes [provider]  JARDIANCE 10 MG TABS tablet Take 25 mg by mouth daily. 11/18/23  Yes [provider]  lisinopril  (PRINIVIL ,ZESTRIL ) 5 MG tablet Take 5 mg by mouth every morning. 06/08/18  Yes [provider]  metFORMIN (GLUCOPHAGE-XR) 500 MG 24 hr tablet Take 500 mg by mouth in the morning and at bedtime. 06/08/18  Yes [provider]  ondansetron  (ZOFRAN -ODT) 8 MG disintegrating tablet Take 8 mg by mouth every 8 (eight) hours as needed. 12/28/23  Yes [provider]  rosuvastatin  (CRESTOR ) 20 MG tablet Take 20 mg by mouth at bedtime. 04/15/20  Yes [provider]  sertraline  (ZOLOFT ) 100 MG tablet Take 100 mg by mouth daily.   Yes [provider]  ACCU-CHEK GUIDE test strip daily. 01/29/20   [provider]  Blood Glucose Monitoring Suppl (ACCU-CHEK GUIDE ME) w/Device KIT daily. 01/29/20   [provider]  Lancets Misc. (ACCU-CHEK FASTCLIX LANCET) KIT daily. 01/29/20   [provider]    Physical Exam: Vitals:   03/26/24 1000 03/26/24 1015 03/26/24 1030 03/26/24 1100  BP: (!) 101/47 (!) 105/53 (!) 105/51 (!) 105/52  Pulse: 67 68 71 70  Resp: 15 15 20 14   Temp:      TempSrc:      SpO2: 99% 99% 96% 97%  Weight:      Height:        Constitutional: NAD, calm, comfortable Vitals:   03/26/24 1000 03/26/24 1015 03/26/24 1030 03/26/24 1100  BP: (!) 101/47 (!) 105/53 (!) 105/51 (!) 105/52  Pulse: 67 68 71 70  Resp: 15 15 20 14   Temp:      TempSrc:      SpO2: 99% 99% 96% 97%  Weight:      Height:       Eyes: lids and conjunctivae normal Neck: normal, supple Respiratory: clear to auscultation bilaterally. Normal respiratory effort. No accessory muscle use.  Nasal cannula  oxygen Cardiovascular: Regular rate and rhythm, no murmurs. Abdomen: no tenderness, no distention. Bowel sounds positive.  Musculoskeletal:  No edema. Skin: no rashes, lesions, ulcers.  Psychiatric: Flat affect  Labs on Admission: I have personally reviewed following labs and imaging studies  CBC: Recent Labs  Lab 03/26/24 0704  WBC 5.3  NEUTROABS 3.5  HGB 11.6*  HCT 36.4  MCV 95.0  PLT 122*   Basic Metabolic Panel: Recent Labs  Lab 03/26/24 0704  NA 142  K 4.0  CL 103  CO2 24  GLUCOSE 84  BUN 54*  CREATININE 2.14*  CALCIUM  8.5*   GFR: Estimated Creatinine Clearance: 20.4 mL/min (A) (by C-G formula based on SCr of 2.14 mg/dL (H)). Liver Function Tests: Recent Labs  Lab 03/26/24 0704  AST 75*  ALT 35  ALKPHOS 63  BILITOT 0.2  PROT 7.1  ALBUMIN 4.0   No results for input(s): LIPASE, AMYLASE in the last 168 hours. No results for input(s): AMMONIA in the last 168 hours. Coagulation Profile: No results for input(s): INR, PROTIME in the last 168 hours. Cardiac Enzymes: No results for input(s): CKTOTAL, CKMB, CKMBINDEX, TROPONINI in the last 168 hours. BNP (last 3 results) No results for input(s): PROBNP in the last 8760 hours. HbA1C: No results for input(s): HGBA1C in the last 72 hours. CBG: No results for input(s): GLUCAP in the last 168 hours. Lipid Profile: No results for input(s): CHOL, HDL, LDLCALC, TRIG, CHOLHDL, LDLDIRECT in the last 72 hours. Thyroid  Function Tests: No results for input(s): TSH, T4TOTAL, FREET4, T3FREE, THYROIDAB in the last 72 hours. Anemia Panel: No results for input(s): VITAMINB12, FOLATE, FERRITIN, TIBC, IRON, RETICCTPCT in the last 72 hours. Urine analysis:    Component Value Date/Time   COLORURINE YELLOW 03/26/2024 1005   APPEARANCEUR HAZY (A) 03/26/2024 1005   APPEARANCEUR Clear 07/11/2023 1054   LABSPEC 1.011 03/26/2024 1005   PHURINE 5.0 03/26/2024 1005    GLUCOSEU >=500 (A) 03/26/2024 1005   HGBUR SMALL (A) 03/26/2024 1005   BILIRUBINUR NEGATIVE 03/26/2024 1005   BILIRUBINUR Negative 07/11/2023 1054   KETONESUR NEGATIVE 03/26/2024 1005   PROTEINUR 30 (A) 03/26/2024 1005   UROBILINOGEN 0.2 10/27/2019 1034   NITRITE NEGATIVE 03/26/2024 1005   LEUKOCYTESUR NEGATIVE 03/26/2024 1005    Radiological Exams on Admission: CT CHEST ABDOMEN PELVIS WO CONTRAST Result Date: 03/26/2024 EXAM: CT CHEST, ABDOMEN AND PELVIS WITHOUT CONTRAST 03/26/2024 10:52:59 AM TECHNIQUE: CT of the chest, abdomen and pelvis was performed without the administration of intravenous contrast. Multiplanar reformatted images are provided for review. Automated exposure control, iterative reconstruction, and/or weight based adjustment of the mA/kV was utilized to reduce the radiation dose to as low as reasonably achievable. COMPARISON: CT abdomen and pelvis 10/21/last year. CLINICAL HISTORY: 73 year old female. Sepsis. FINDINGS: CHEST: MEDIASTINUM AND LYMPH NODES: Aortic atherosclerosis. Mild cardiomegaly without pericardial effusion. LAD coronary artery calcification. Right axillary lymph nodes are asymmetrically increased in number but remain within normal limits. No other lymphadenopathy in the chest. LUNGS AND PLEURA: The central airways are clear. Lower lung volumes with chronic lung base scarring. Increased patchiness of the bilateral lower lobe atelectasis. Major airways are patent. No pleural effusion. No pneumothorax. No convincing active lung inflammation. ABDOMEN AND PELVIS: LIVER: The liver is unremarkable. GALLBLADDER AND BILE DUCTS: Gallbladder is unremarkable. No biliary ductal dilatation. SPLEEN: No acute abnormality. PANCREAS: No acute abnormality. ADRENAL GLANDS: No acute abnormality. KIDNEYS, URETERS AND BLADDER: Chronic asymmetric left renal volume loss. Asymmetric chronic left nephrolithiasis and or nephrocalcinosis. Right kidney and ureter appear nonobstructed. Multifocal  distal left ureteral calculi in the pelvis and also at the left ureterovesical junction just inside the urinary bladder (see series 2 images 103 through 111). New left hydroureter. Only mild left hydronephrosis. Upstream of the bladder there are numerous small stones in the distal left ureter. No perinephric or periureteral stranding. Urinary bladder is unremarkable. GI AND BOWEL: Decompressed stomach. Fluid in nondilated small and large bowel loops suggesting acute enteritis / diarrhea. No dilated bowel. Normal appendix on series 2 image 94. No discrete bowel inflammation. Scattered colonic diverticula. There is no bowel obstruction. REPRODUCTIVE ORGANS: Hysterectomy noted. Mild pelvic floor laxity. PERITONEUM AND RETROPERITONEUM: No ascites. No free air. VASCULATURE: Aortic atherosclerosis. Aorta is normal in caliber. ABDOMINAL AND PELVIS LYMPH NODES: No lymphadenopathy. BONES AND SOFT TISSUES: Chronic L4-5 anterolisthesis. Hyperostosis in the thoracic spine  resulting in levels of chronic interbody ankylosis. No acute osseous abnormality. No focal soft tissue abnormality. IMPRESSION: 1. Acute obstructive uropathy on the left. Distal left ureteral Steinstrasse in the pelvis. And additional stone at the left UVJ. 2. Fluid in nondilated small and large bowel loops suggesting acute enteritis/diarrhea. 3. Atelectasis; No acute or inflammatory process identified in the chest. Electronically signed by: Helayne Hurst MD 03/26/2024 11:15 AM EDT RP Workstation: [id number]   CT Head Wo Contrast Result Date: 03/26/2024 EXAM: CT HEAD WITHOUT CONTRAST 03/26/2024 07:31:00 AM TECHNIQUE: CT of the head was performed without the administration of intravenous contrast. Automated exposure control, iterative reconstruction, and/or weight based adjustment of the mA/kV was utilized to reduce the radiation dose to as low as reasonably achievable. COMPARISON: CTA, CT venogram 07/07/2022. CLINICAL HISTORY: 73 year old female. Mental  status change, unknown cause. Weakness started as a sore throat a few days ago, progressing to difficulty standing/walking. FINDINGS: BRAIN AND VENTRICLES: No acute hemorrhage. No evidence of acute infarct. No hydrocephalus. No extra-axial collection. No mass effect or midline shift. normal brain volume. Gray white differentiation normalized since last year. No encephalomalacia identified. No suspicious intracranial vascular hyperdensity. ORBITS: No acute abnormality. SINUSES: No acute abnormality. SOFT TISSUES AND SKULL: No acute soft tissue abnormality. No skull fracture. IMPRESSION: 1. Normal for age non-contrast CT appearance of the brain. Electronically signed by: Helayne Hurst MD 03/26/2024 07:40 AM EDT RP Workstation: [id number]   DG Chest Portable 1 View Result Date: 03/26/2024 EXAM: 1 VIEW(S) XRAY OF THE CHEST 03/26/2024 07:10:00 AM COMPARISON: None available. CLINICAL HISTORY: Pov from home cc of weakness that started out as a sore throat a few days ago. Went to pcp and they werent very helpful but then day before yesterday she started to become more and more weak. Has trouble standing/walking. Family tried to get her to come ; yesterday but wouldn't. ; 0/10 pain FINDINGS: LUNGS AND PLEURA: Low lung volume. No focal pulmonary opacity. No pulmonary edema. No pleural effusion. No pneumothorax. HEART AND MEDIASTINUM: Mild cardiomegaly, accentuated by low lung volume. BONES AND SOFT TISSUES: No acute osseous abnormality. IMPRESSION: 1. No acute cardiopulmonary abnormality identified. Electronically signed by: Waddell Calk MD 03/26/2024 07:20 AM EDT RP Workstation: [id number]    EKG: Independently reviewed. SR 89bpm with RBBB.  Assessment/Plan Principal Problem:   AKI (acute kidney injury) Active Problems:   HTN (hypertension)   Stage 3a chronic kidney disease (CKD) (HCC)   DM2 (diabetes mellitus, type 2) (HCC)   Kidney stone    AKI on CKD stage III AA in the setting of left ureteral  obstructive uropathy - Baseline creatinine around 0.9-1.2, currently 2.14 - Likely in the setting of obstructive uropathy - Urology plans for stent placement 10/23 - UA without any significant UTI, but continue Rocephin  for now - Continue IV fluid - Avoid nephrotoxic agents - Monitor urine output and renal panel  History of urolithiasis - Patient has had ESL previously - Appreciate urology for stent placement  Hypertension - Currently with soft blood pressure readings, hold lisinopril  - IV hydralazine  as needed  Type 2 diabetes - Currently without hyperglycemia - Avoid oral agents and maintain on SSI  Mild thrombocytopenia - Avoid heparin agents   DVT prophylaxis: SCDs Code Status: Full Family Communication: Son at bedside Disposition Plan:Admit for AKI with obstructive uropathy Consults called:Urology Admission status: Observation, SDU  Severity of Illness: The appropriate patient status for this patient is OBSERVATION. Observation status is judged to be reasonable and necessary in order to provide  the required intensity of service to ensure the patient's safety. The patient's presenting symptoms, physical exam findings, and initial radiographic and laboratory data in the context of their medical condition is felt to place them at decreased risk for further clinical deterioration. Furthermore, it is anticipated that the patient will be medically stable for discharge from the hospital within 2 midnights of admission.    Kairo Laubacher D Maree DO [facility]  If 7PM-7AM, please contact night-coverage [email or web address]  03/26/2024, 1:48 PM

## 2024-03-26 NOTE — ED Triage Notes (Signed)
 Pov from home cc of weakness that started out as a sore throat a few days ago. Went to pcp and they werent very helpful but then day before yesterday she started to become more and more weak. Has trouble standing/walking. Family tried to get her to come yesterday but wouldn't.  0/10 pain

## 2024-03-27 ENCOUNTER — Encounter (HOSPITAL_COMMUNITY): Payer: Self-pay | Admitting: Urology

## 2024-03-27 DIAGNOSIS — N179 Acute kidney failure, unspecified: Secondary | ICD-10-CM | POA: Diagnosis not present

## 2024-03-27 LAB — GLUCOSE, CAPILLARY
Glucose-Capillary: 118 mg/dL — ABNORMAL HIGH (ref 70–99)
Glucose-Capillary: 123 mg/dL — ABNORMAL HIGH (ref 70–99)
Glucose-Capillary: 158 mg/dL — ABNORMAL HIGH (ref 70–99)

## 2024-03-27 LAB — URINE CULTURE: Culture: NO GROWTH

## 2024-03-27 LAB — CBC
HCT: 32.3 % — ABNORMAL LOW (ref 36.0–46.0)
Hemoglobin: 9.9 g/dL — ABNORMAL LOW (ref 12.0–15.0)
MCH: 29.8 pg (ref 26.0–34.0)
MCHC: 30.7 g/dL (ref 30.0–36.0)
MCV: 97.3 fL (ref 80.0–100.0)
Platelets: 110 K/uL — ABNORMAL LOW (ref 150–400)
RBC: 3.32 MIL/uL — ABNORMAL LOW (ref 3.87–5.11)
RDW: 15.3 % (ref 11.5–15.5)
WBC: 5.4 K/uL (ref 4.0–10.5)
nRBC: 0 % (ref 0.0–0.2)

## 2024-03-27 LAB — BASIC METABOLIC PANEL WITH GFR
Anion gap: 11 (ref 5–15)
BUN: 41 mg/dL — ABNORMAL HIGH (ref 8–23)
CO2: 26 mmol/L (ref 22–32)
Calcium: 8 mg/dL — ABNORMAL LOW (ref 8.9–10.3)
Chloride: 107 mmol/L (ref 98–111)
Creatinine, Ser: 1.77 mg/dL — ABNORMAL HIGH (ref 0.44–1.00)
GFR, Estimated: 29 mL/min — ABNORMAL LOW (ref >60)
Glucose, Bld: 114 mg/dL — ABNORMAL HIGH (ref 70–99)
Potassium: 3.9 mmol/L (ref 3.5–5.1)
Sodium: 144 mmol/L (ref 135–145)

## 2024-03-27 LAB — MAGNESIUM: Magnesium: 1.7 mg/dL (ref 1.7–2.4)

## 2024-03-27 NOTE — Anesthesia Postprocedure Evaluation (Signed)
 Anesthesia Post Note  Patient: Allison Oneill  Procedure(s) Performed: CYSTOSCOPY, WITH RETROGRADE PYELOGRAM AND URETERAL STENT INSERTION (Left: Ureter)  Patient location during evaluation: Phase II Anesthesia Type: General Level of consciousness: awake Pain management: pain level controlled Vital Signs Assessment: post-procedure vital signs reviewed and stable Respiratory status: spontaneous breathing and respiratory function stable Cardiovascular status: blood pressure returned to baseline and stable Postop Assessment: no headache and no apparent nausea or vomiting Anesthetic complications: no Comments: Late entry   No notable events documented.   Last Vitals:  Vitals:   03/27/24 0145 03/27/24 0354  BP: (!) 109/56 (!) 111/52  Pulse: 72 69  Resp: 17 20  Temp: 36.6 C 36.6 C  SpO2: 98% 99%    Last Pain:  Vitals:   03/27/24 0145  TempSrc: Oral  PainSc:                  Yvonna JINNY Bosworth

## 2024-03-27 NOTE — Progress Notes (Signed)
 Mobility Specialist Progress Note:    03/27/24 0955  Mobility  Activity Pivoted/transferred from chair to bed  Level of Assistance Moderate assist, patient does 50-74% (+2)  Assistive Device None  Distance Ambulated (ft) 3 ft  Range of Motion/Exercises Active;All extremities  Activity Response Tolerated well  Mobility Referral Yes  Mobility visit 1 Mobility  Mobility Specialist Start Time (ACUTE ONLY) 0955  Mobility Specialist Stop Time (ACUTE ONLY) 1010  Mobility Specialist Time Calculation (min) (ACUTE ONLY) 15 min   Pt received in chair, requesting assistance to bed. Required ModA +2 to stand and transfer with no AD. Tolerated well, asx throughout. Family and NT in room, all needs met.  Deakin Lacek Mobility Specialist Please contact via Special educational needs teacher or  Rehab office at [phone]

## 2024-03-27 NOTE — Plan of Care (Signed)
  Problem: Acute Rehab PT Goals(only PT should resolve) Goal: Pt Will Go Sit To Supine/Side Outcome: Progressing Flowsheets (Taken 03/27/2024 1221) Pt will go Sit to Supine/Side:  with supervision  with contact guard assist Goal: Patient Will Perform Sitting Balance Outcome: Progressing Flowsheets (Taken 03/27/2024 1221) Patient will perform sitting balance:  with supervision  with contact guard assist Goal: Patient Will Transfer Sit To/From Stand Outcome: Progressing Flowsheets (Taken 03/27/2024 1221) Patient will transfer sit to/from stand:  with supervision  with contact guard assist Goal: Pt Will Transfer Bed To Chair/Chair To Bed Outcome: Progressing Flowsheets (Taken 03/27/2024 1221) Pt will Transfer Bed to Chair/Chair to Bed:  with supervision  with contact guard assist Goal: Pt Will Perform Standing Balance Or Pre-Gait Outcome: Progressing Flowsheets (Taken 03/27/2024 1221) Pt will perform standing balance or pre-gait: with contact guard assist Note: With/ RW Goal: Pt Will Ambulate Outcome: Progressing Flowsheets (Taken 03/27/2024 1221) Pt will Ambulate:  with contact guard assist  75 feet Note: W/ RW   Biana Haggar, SPT

## 2024-03-27 NOTE — Progress Notes (Addendum)
 Patient Details Name: SAVANNAH MORFORD MRN: [id number] DOB: August 14, 1950 Today's Date: 03/27/2024   The beneficiary is confined to a single room and will need a BSC to safely return home at time of hospital discharge.     Hoy Bigness MSW, LCSW Environmental manager

## 2024-03-27 NOTE — TOC Progression Note (Signed)
 Transition of Care [facility]) - Progression Note    Patient Details  Name: Allison Oneill MRN: [id number] Date of Birth: 12-26-1950  [facility] CM/SW Contact  Hoy DELENA Bigness, LCSW Phone Number: 03/27/2024, 12:28 PM  Clinical Narrative:    Pt agreeable to [facility] and DME recommendation. Pt denies having HH in the past. HHPT/OT has been arranged with [facility]. HH orders are in place. 3n1 ordered through [facility] to be delivered to pt's room prior to discharge.      Barriers to Discharge: Barriers Resolved               Expected Discharge Plan and Services                         DME Arranged: Bedside commode DME Agency: [employer] Date DME Agency Contacted: 03/27/24 Time DME Agency Contacted: 1124 Representative spoke with at DME Agency: Darlyn Iba HH Arranged: PT, OT HH Agency: [facility] Date [facility] Agency Contacted: 03/27/24 Time HH Agency Contacted: [phone] Representative spoke with at [facility] Agency: Lauraine   Social Drivers of Health (SDOH) Interventions SDOH Screenings   Food Insecurity: No Food Insecurity (03/26/2024)  Housing: Low Risk  (03/26/2024)  Transportation Needs: No Transportation Needs (03/26/2024)  Utilities: Not At Risk (03/26/2024)  Social Connections: Unknown (03/26/2024)  Tobacco Use: Low Risk  (03/26/2024)    Readmission Risk Interventions     No data to display

## 2024-03-27 NOTE — Evaluation (Signed)
 Occupational Therapy Evaluation Patient Details Name: Allison Oneill MRN: [id number] DOB: Mar 31, 1951 Today's Date: 03/27/2024   History of Present Illness   Allison Oneill is a 73 y.o. female with medical history significant for hypertension, type 2 diabetes, CKD stage III AA, sciatica, and urolithiasis who presented to the ED with history of worsening left-sided flank pain, fevers, weakness, and falls over the last 2 days.  She was also noted to have some nausea, but no vomiting and states that she has had similar symptoms when she has had prior nephrolithiasis issues.  She fell last night and this morning but denies any injuries.  Flank pain is described as sharp and constant and is overall nonradiating. (per DO)     Clinical Impressions Pt agreeable to OT and PT co-evaluation. Pt has husband to assist at home 24/7 if needed. Pt required CGA to min A for functional transfer with and without RW today. Pt able to complete bed mobility with CGA. B UE weak with limited A/ROM bilaterally with pt reporting history of R rotator cuff tear without any follow up therapy. Mod A needed for lower body dressing today with pt having difficulty managing on her own. BSC recommended due to slow and labored movement today. Pt has a regular toilet at home without any grab bars reported. Pt left in the chair with call bell within reach. Pt will benefit from continued OT in the hospital to increase strength, balance, and endurance for safe ADL's.        If plan is discharge home, recommend the following:   A little help with walking and/or transfers;A lot of help with bathing/dressing/bathroom;Assistance with cooking/housework;Assist for transportation;Help with stairs or ramp for entrance     Functional Status Assessment   Patient has had a recent decline in their functional status and demonstrates the ability to make significant improvements in function in a reasonable and predictable amount of time.      Equipment Recommendations   BSC/3in1              Precautions/Restrictions   Precautions Precautions: Fall Recall of Precautions/Restrictions: Intact Restrictions Weight Bearing Restrictions Per Provider Order: No     Mobility Bed Mobility Overal bed mobility: Needs Assistance Bed Mobility: Supine to Sit, Sit to Supine     Supine to sit: Contact guard, Used rails Sit to supine: Contact guard assist   General bed mobility comments: Labored movement; extended time; use of rail.    Transfers Overall transfer level: Needs assistance   Transfers: Sit to/from Stand, Bed to chair/wheelchair/BSC Sit to Stand: Contact guard assist, Min assist     Step pivot transfers: Contact guard assist, Min assist     General transfer comment: Chair to EOB without AD; unsteady; slow movement.      Balance Overall balance assessment: Needs assistance Sitting-balance support: No upper extremity supported, Feet supported Sitting balance-Leahy Scale: Good Sitting balance - Comments: seated at EOB   Standing balance support: No upper extremity supported, During functional activity Standing balance-Leahy Scale: Fair Standing balance comment: Poor to fair without AD; fair with RW                           ADL either performed or assessed with clinical judgement   ADL Overall ADL's : Needs assistance/impaired     Grooming: Contact guard assist;Standing   Upper Body Bathing: Set up;Sitting   Lower Body Bathing: Moderate assistance;Sitting/lateral leans   Upper Body  Dressing : Set up;Sitting   Lower Body Dressing: Moderate assistance;Sitting/lateral leans Lower Body Dressing Details (indicate cue type and reason): Mod A to don sock seated in the chair. Toilet Transfer: Contact guard assist;Minimal assistance;Stand-pivot;Ambulation;Rolling walker (2 wheels) Toilet Transfer Details (indicate cue type and reason): Chair to bed without AD and CGA to min A; sit to  stand and ambulation with RW more CGA. Toileting- Clothing Manipulation and Hygiene: Minimal assistance;Sitting/lateral lean       Functional mobility during ADLs: Contact guard assist;Rolling walker (2 wheels) General ADL Comments: Slow and labored movement for short distance ambulation with RW.     Vision Baseline Vision/History: 0 No visual deficits Ability to See in Adequate Light: 0 Adequate Patient Visual Report: No change from baseline Vision Assessment?: No apparent visual deficits     Perception Perception: Not tested       Praxis Praxis: Not tested       Pertinent Vitals/Pain Pain Assessment Pain Assessment: No/denies pain     Extremity/Trunk Assessment Upper Extremity Assessment Upper Extremity Assessment: Generalized weakness;RUE deficits/detail (3-/5 shoulder flexion bilaterally. 4/5 grossly otherwise.) RUE Deficits / Details: Reports R rotatory cuff tear a year ago that the pt never recieved any therapy for. Limited to 3-/5 shoulder flexion with near full P/ROM. Generally weak near 4/5 otherwise for R UE strength.   Lower Extremity Assessment Lower Extremity Assessment: Defer to PT evaluation   Cervical / Trunk Assessment Cervical / Trunk Assessment: Normal   Communication Communication Communication: No apparent difficulties   Cognition Arousal: Alert Behavior During Therapy: WFL for tasks assessed/performed Cognition: No apparent impairments                               Following commands: Intact       Cueing  General Comments   Cueing Techniques: Verbal cues;Tactile cues                 Home Living Family/patient expects to be discharged to:: Private residence Living Arrangements: Spouse/significant other Available Help at Discharge: Family;Available 24 hours/day Type of Home: House Home Access: Level entry     Home Layout: One level     Bathroom Shower/Tub: Chief Strategy Officer: Standard Bathroom  Accessibility: Yes How Accessible: Accessible via wheelchair;Accessible via walker Home Equipment: Rolling Walker (2 wheels);Cane - single point          Prior Functioning/Environment Prior Level of Function : Needs assist;Driving;History of Falls (last six months) (1 fall in 6 month that took place outside.)       Physical Assist : ADLs (physical)   ADLs (physical): IADLs Mobility Comments: Community ambulator with [facility] use PRN ADLs Comments: Independent; drives    OT Problem List: Decreased strength;Decreased range of motion;Decreased activity tolerance;Impaired balance (sitting and/or standing)   OT Treatment/Interventions: Self-care/ADL training;Therapeutic exercise;Therapeutic activities;Patient/family education;Balance training;DME and/or AE instruction      OT Goals(Current goals can be found in the care plan section)   Acute Rehab OT Goals Patient Stated Goal: return home OT Goal Formulation: With patient Time For Goal Achievement: 04/10/24 Potential to Achieve Goals: Good   OT Frequency:  Min 2X/week    Co-evaluation PT/OT/SLP Co-Evaluation/Treatment: Yes Reason for Co-Treatment: To address functional/ADL transfers   OT goals addressed during session: ADL's and self-care      AM-PAC OT 6 Clicks Daily Activity     Outcome Measure Help from another person eating meals?: None Help from  another person taking care of personal grooming?: A Little Help from another person toileting, which includes using toliet, bedpan, or urinal?: A Little Help from another person bathing (including washing, rinsing, drying)?: A Lot Help from another person to put on and taking off regular upper body clothing?: A Little Help from another person to put on and taking off regular lower body clothing?: A Lot 6 Click Score: 17   End of Session Equipment Utilized During Treatment: Rolling walker (2 wheels);Gait belt  Activity Tolerance: Patient tolerated treatment well Patient  left: in chair;with call bell/phone within reach  OT Visit Diagnosis: Unsteadiness on feet (R26.81);Other abnormalities of gait and mobility (R26.89);Muscle weakness (generalized) (M62.81)                Time: 9154-9089 OT Time Calculation (min): 25 min Charges:  OT General Charges $OT Visit: 1 Visit OT Evaluation $OT Eval Low Complexity: 1 Low  Zemira Zehring OT, MOT   Jayson Person 03/27/2024, 10:40 AM

## 2024-03-27 NOTE — Plan of Care (Signed)
  Problem: Acute Rehab OT Goals (only OT should resolve) Goal: Pt. Will Perform Grooming Flowsheets (Taken 03/27/2024 1044) Pt Will Perform Grooming:  with modified independence  standing Goal: Pt. Will Perform Lower Body Bathing Flowsheets (Taken 03/27/2024 1044) Pt Will Perform Lower Body Bathing: with modified independence Goal: Pt. Will Perform Lower Body Dressing Flowsheets (Taken 03/27/2024 1044) Pt Will Perform Lower Body Dressing: with modified independence Goal: Pt. Will Transfer To Toilet Flowsheets (Taken 03/27/2024 1044) Pt Will Transfer to Toilet:  with modified independence  ambulating Goal: Pt. Will Perform Toileting-Clothing Manipulation Flowsheets (Taken 03/27/2024 1044) Pt Will Perform Toileting - Clothing Manipulation and hygiene: with modified independence Goal: Pt/Caregiver Will Perform Home Exercise Program Flowsheets (Taken 03/27/2024 1044) Pt/caregiver will Perform Home Exercise Program:  Increased ROM  Increased strength  Both right and left upper extremity  Independently  Janasha Barkalow OT, MOT

## 2024-03-27 NOTE — Care Management Obs Status (Signed)
 MEDICARE OBSERVATION STATUS NOTIFICATION   Patient Details  Name: Allison Oneill MRN: [id number] Date of Birth: 1950-08-29   Medicare Observation Status Notification Given:  Yes    Duwaine LITTIE Ada 03/27/2024, 11:27 AM

## 2024-03-27 NOTE — Discharge Summary (Signed)
 Physician Discharge Summary  Allison Oneill FMW:[id number] DOB: 1950/08/29 DOA: 03/26/2024  PCP: Lari Elspeth BRAVO, MD  Admit date: 03/26/2024  Discharge date: 03/27/2024  Admitted From:Home  Disposition:  Home  Recommendations for Outpatient Follow-up:  Follow up with Urology Dr. Matilda 11/11 for stone extraction Remain on medications as noted below  Home Health: PT/OT  Equipment/Devices: 3 n 1  Discharge Condition:Stable  CODE STATUS: Full  Diet recommendation: Heart Healthy/Carb Mod  Brief/Interim Summary: Allison Oneill is a 73 y.o. female with medical history significant for hypertension, type 2 diabetes, CKD stage III AA, sciatica, and urolithiasis who presented to the ED with history of worsening left-sided flank pain, fevers, weakness, and falls over the last 2 days.  She was also noted to have some nausea, but no vomiting and states that she has had similar symptoms when she has had prior nephrolithiasis issues. Patient was admitted for left ureteral stone and underwent left JJ stent placement on 10/23 with no complications noted.  She is overall doing well and no growth noted and urine cultures while she remained on IV Rocephin .  She is now in stable condition for discharge and is overall voiding without need for Foley catheter.  She will follow-up in 2 weeks for stone extraction.  She has been seen by PT/OT with recommendations for home health.  Discharge Diagnoses:  Principal Problem:   AKI (acute kidney injury) Active Problems:   HTN (hypertension)   Stage 3a chronic kidney disease (CKD) (HCC)   DM2 (diabetes mellitus, type 2) (HCC)   Kidney stone  Principal discharge diagnosis: AKI secondary to ureteral stone obstruction s/p stent placement 10/23.  Discharge Instructions  Discharge Instructions     Diet - low sodium heart healthy   Complete by: As directed    Increase activity slowly   Complete by: As directed    No wound care   Complete by: As  directed       Allergies as of 03/27/2024       Reactions   Alfuzosin  Other (See Comments)   Made her feel dizzy and out of it   Sulfa Antibiotics Itching, Rash        Medication List     STOP taking these medications    lisinopril  5 MG tablet Commonly known as: ZESTRIL        TAKE these medications    Accu-Chek [employer] Kit daily.   Accu-Chek Guide Me w/Device Kit daily.   Accu-Chek Guide test strip Generic drug: glucose blood daily.   fexofenadine 180 MG tablet Commonly known as: ALLEGRA Take 180 mg by mouth at bedtime.   fluticasone 50 MCG/ACT nasal spray Commonly known as: FLONASE Place 2 sprays into both nostrils daily as needed for allergies.   FT Vitamin B-12 PR 1000 MCG Tbcr Generic drug: Cyanocobalamin  TAKE 1 TABLET BY MOUTH DAILY   gabapentin  300 MG capsule Commonly known as: NEURONTIN  Take 300 mg by mouth daily.   glipiZIDE 10 MG 24 hr tablet Commonly known as: GLUCOTROL XL Take 10 mg by mouth in the morning and at bedtime.   hydrochlorothiazide  12.5 MG tablet Commonly known as: HYDRODIURIL  Take 12.5 mg by mouth daily.   hydrocortisone 2.5 % cream Apply 1 Application topically.   Jardiance 10 MG Tabs tablet Generic drug: empagliflozin Take 25 mg by mouth daily.   metFORMIN 500 MG 24 hr tablet Commonly known as: GLUCOPHAGE-XR Take 500 mg by mouth in the morning and at bedtime.   ondansetron  8 MG  disintegrating tablet Commonly known as: ZOFRAN -ODT Take 8 mg by mouth every 8 (eight) hours as needed.   rosuvastatin  20 MG tablet Commonly known as: CRESTOR  Take 20 mg by mouth at bedtime.   sertraline  100 MG tablet Commonly known as: ZOLOFT  Take 100 mg by mouth daily.               Durable Medical Equipment  (From admission, onward)           Start     Ordered   03/27/24 1139  For home use only DME 3 n 1  Once       Comments: Weakness   03/27/24 1139   03/27/24 1049  For home use only DME 3 n 1  Once         03/27/24 1048            Follow-up Information     [facility], [place] Follow up.   Why: [facility] will follow up with you at discharge to provide home health services Contact information: [address] [place] [address] [phone]         Matilda Senior, MD Follow up on 04/14/2024.   Specialty: Urology Contact information: [address]  [place] [address] [phone]                Allergies  Allergen Reactions   Alfuzosin  Other (See Comments)    Made her feel dizzy and out of it   Sulfa Antibiotics Itching and Rash    Consultations: Urology   Procedures/Studies: DG C-Arm 1-60 Min-No Report Result Date: 03/26/2024 Fluoroscopy was utilized by the requesting physician.  No radiographic interpretation.   CT CHEST ABDOMEN PELVIS WO CONTRAST Result Date: 03/26/2024 EXAM: CT CHEST, ABDOMEN AND PELVIS WITHOUT CONTRAST 03/26/2024 10:52:59 AM TECHNIQUE: CT of the chest, abdomen and pelvis was performed without the administration of intravenous contrast. Multiplanar reformatted images are provided for review. Automated exposure control, iterative reconstruction, and/or weight based adjustment of the mA/kV was utilized to reduce the radiation dose to as low as reasonably achievable. COMPARISON: CT abdomen and pelvis 10/21/last year. CLINICAL HISTORY: 73 year old female. Sepsis. FINDINGS: CHEST: MEDIASTINUM AND LYMPH NODES: Aortic atherosclerosis. Mild cardiomegaly without pericardial effusion. LAD coronary artery calcification. Right axillary lymph nodes are asymmetrically increased in number but remain within normal limits. No other lymphadenopathy in the chest. LUNGS AND PLEURA: The central airways are clear. Lower lung volumes with chronic lung base scarring. Increased patchiness of the bilateral lower lobe atelectasis. Major airways are patent. No pleural effusion. No pneumothorax. No  convincing active lung inflammation. ABDOMEN AND PELVIS: LIVER: The liver is unremarkable. GALLBLADDER AND BILE DUCTS: Gallbladder is unremarkable. No biliary ductal dilatation. SPLEEN: No acute abnormality. PANCREAS: No acute abnormality. ADRENAL GLANDS: No acute abnormality. KIDNEYS, URETERS AND BLADDER: Chronic asymmetric left renal volume loss. Asymmetric chronic left nephrolithiasis and or nephrocalcinosis. Right kidney and ureter appear nonobstructed. Multifocal distal left ureteral calculi in the pelvis and also at the left ureterovesical junction just inside the urinary bladder (see series 2 images 103 through 111). New left hydroureter. Only mild left hydronephrosis. Upstream of the bladder there are numerous small stones in the distal left ureter. No perinephric or periureteral stranding. Urinary bladder is unremarkable. GI AND BOWEL: Decompressed stomach. Fluid in nondilated small and large bowel loops suggesting acute enteritis / diarrhea. No dilated bowel. Normal appendix on series 2 image 94. No discrete bowel inflammation. Scattered  colonic diverticula. There is no bowel obstruction. REPRODUCTIVE ORGANS: Hysterectomy noted. Mild pelvic floor laxity. PERITONEUM AND RETROPERITONEUM: No ascites. No free air. VASCULATURE: Aortic atherosclerosis. Aorta is normal in caliber. ABDOMINAL AND PELVIS LYMPH NODES: No lymphadenopathy. BONES AND SOFT TISSUES: Chronic L4-5 anterolisthesis. 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 No acute osseous abnormality. No focal soft tissue abnormality. IMPRESSION: 1. Acute obstructive uropathy on the left. Distal left ureteral Steinstrasse in the pelvis. And additional stone at the left UVJ. 2. Fluid in nondilated small and large bowel loops suggesting acute enteritis/diarrhea. 3. Atelectasis; No acute or inflammatory process identified in the chest. Electronically signed by: Helayne Hurst MD 03/26/2024 11:15 AM EDT RP Workstation:  [id number]   CT Head Wo Contrast Result Date: 03/26/2024 EXAM: CT HEAD WITHOUT CONTRAST 03/26/2024 07:31:00 AM TECHNIQUE: CT of the head was performed without the administration of intravenous contrast. Automated exposure control, iterative reconstruction, and/or weight based adjustment of the mA/kV was utilized to reduce the radiation dose to as low as reasonably achievable. COMPARISON: CTA, CT venogram 07/07/2022. CLINICAL HISTORY: 73 year old female. Mental status change, unknown cause. Weakness started as a sore throat a few days ago, progressing to difficulty standing/walking. FINDINGS: BRAIN AND VENTRICLES: No acute hemorrhage. No evidence of acute infarct. No hydrocephalus. No extra-axial collection. No mass effect or midline shift. normal brain volume. Gray white differentiation normalized since last year. No encephalomalacia identified. No suspicious intracranial vascular hyperdensity. ORBITS: No acute abnormality. SINUSES: No acute abnormality. SOFT TISSUES AND SKULL: No acute soft tissue abnormality. No skull fracture. IMPRESSION: 1. Normal for age non-contrast CT appearance of the brain. Electronically signed by: Helayne Hurst MD 03/26/2024 07:40 AM EDT RP Workstation: [id number]   DG Chest Portable 1 View Result Date: 03/26/2024 EXAM: 1 VIEW(S) XRAY OF THE CHEST 03/26/2024 07:10:00 AM COMPARISON: None available. CLINICAL HISTORY: Pov from home cc of weakness that started out as a sore throat a few days ago. Went to pcp and they werent very helpful but then day before yesterday she started to become more and more weak. Has trouble standing/walking. Family tried to get her to come ; yesterday but wouldn't. ; 0/10 pain FINDINGS: LUNGS AND PLEURA: Low lung volume. No focal pulmonary opacity. No pulmonary edema. No pleural effusion. No pneumothorax. HEART AND MEDIASTINUM: Mild cardiomegaly, accentuated by low lung volume. BONES AND SOFT TISSUES: No acute osseous abnormality. IMPRESSION: 1. No acute  cardiopulmonary abnormality identified. Electronically signed by: Waddell Calk MD 03/26/2024 07:20 AM EDT RP Workstation: [id number]     Discharge Exam: Vitals:   03/27/24 0354 03/27/24 1232  BP: (!) 111/52 (!) 111/53  Pulse: 69 78  Resp: 20 16  Temp: 97.8 F (36.6 C) 99.1 F (37.3 C)  SpO2: 99% 91%   Vitals:   03/26/24 2034 03/27/24 0145 03/27/24 0354 03/27/24 1232  BP: (!) 100/51 (!) 109/56 (!) 111/52 (!) 111/53  Pulse: 75 72 69 78  Resp: 16 17 20 16   Temp: 97.8 F (36.6 C) 97.9 F (36.6 C) 97.8 F (36.6 C) 99.1 F (37.3 C)  TempSrc:  Oral  Oral  SpO2: 96% 98% 99% 91%  Weight:      Height:        General: Pt is alert, awake, not in acute distress Cardiovascular: RRR, S1/S2 +, no rubs, no gallops Respiratory: CTA bilaterally, no wheezing, no rhonchi Abdominal: Soft, NT, ND, bowel sounds + Extremities: no edema, no cyanosis    The results of significant diagnostics from this hospitalization (including  imaging, microbiology, ancillary and laboratory) are listed below for reference.     Microbiology: Recent Results (from the past 240 hours)  Group A Strep by PCR     Status: None   Collection Time: 03/26/24  6:44 AM   Specimen: Throat; Sterile Swab  Result Value Ref Range Status   Group A Strep by PCR NOT DETECTED NOT DETECTED Final    Comment: Performed at [facility], [address] [place], [place] [address]  Resp panel by RT-PCR (RSV, Flu A&B, Covid) Throat     Status: None   Collection Time: 03/26/24  6:44 AM   Specimen: Throat; Nasal Swab  Result Value Ref Range Status   SARS Coronavirus 2 by RT PCR NEGATIVE NEGATIVE Final    Comment: (NOTE) SARS-CoV-2 target nucleic acids are NOT DETECTED.  The SARS-CoV-2 RNA is generally detectable in upper respiratory specimens during the acute phase of infection. The lowest concentration of SARS-CoV-2 viral copies this assay can detect is 138 copies/mL. A negative result does not preclude SARS-Cov-2 infection and  should not be used as the sole basis for treatment or other patient management decisions. A negative result may occur with  improper specimen collection/handling, submission of specimen other than nasopharyngeal swab, presence of viral mutation(s) within the areas targeted by this assay, and inadequate number of viral copies(<138 copies/mL). A negative result must be combined with clinical observations, patient history, and epidemiological information. The expected result is Negative.  Fact Sheet for Patients:  BloggerCourse.com  Fact Sheet for Healthcare Providers:  SeriousBroker.it  This test is no t yet approved or cleared by the [place]  FDA and  has been authorized for detection and/or diagnosis of SARS-CoV-2 by FDA under an Emergency Use Authorization (EUA). This EUA will remain  in effect (meaning this test can be used) for the duration of the COVID-19 declaration under Section 564(b)(1) of the Act, 21 U.S.C.section 360bbb-3(b)(1), unless the authorization is terminated  or revoked sooner.       Influenza A by PCR NEGATIVE NEGATIVE Final   Influenza B by PCR NEGATIVE NEGATIVE Final    Comment: (NOTE) The Xpert Xpress SARS-CoV-2/FLU/RSV plus assay is intended as an aid in the diagnosis of influenza from Nasopharyngeal swab specimens and should not be used as a sole basis for treatment. Nasal washings and aspirates are unacceptable for Xpert Xpress SARS-CoV-2/FLU/RSV testing.  Fact Sheet for Patients: BloggerCourse.com  Fact Sheet for Healthcare Providers: SeriousBroker.it  This test is not yet approved or cleared by the [place]  FDA and has been authorized for detection and/or diagnosis of SARS-CoV-2 by FDA under an Emergency Use Authorization (EUA). This EUA will remain in effect (meaning this test can be used) for the duration of the COVID-19 declaration  under Section 564(b)(1) of the Act, 21 U.S.C. section 360bbb-3(b)(1), unless the authorization is terminated or revoked.     Resp Syncytial Virus by PCR NEGATIVE NEGATIVE Final    Comment: (NOTE) Fact Sheet for Patients: BloggerCourse.com  Fact Sheet for Healthcare Providers: SeriousBroker.it  This test is not yet approved or cleared by the [place]  FDA and has been authorized for detection and/or diagnosis of SARS-CoV-2 by FDA under an Emergency Use Authorization (EUA). This EUA will remain in effect (meaning this test can be used) for the duration of the COVID-19 declaration under Section 564(b)(1) of the Act, 21 U.S.C. section 360bbb-3(b)(1), unless the authorization is terminated or revoked.  Performed at [facility], [address] [place], [place] [address]   Blood  culture (routine x 2)     Status: None (Preliminary result)   Collection Time: 03/26/24  6:55 AM   Specimen: BLOOD  Result Value Ref Range Status   Specimen Description BLOOD RIGHT ANTECUBITAL  Final   Special Requests   Final    BOTTLES DRAWN AEROBIC AND ANAEROBIC Blood Culture adequate volume   Culture   Final    NO GROWTH 1 DAY Performed at [facility], [address] [place], [place] [address]    Report Status PENDING  Incomplete  Blood culture (routine x 2)     Status: None (Preliminary result)   Collection Time: 03/26/24  7:05 AM   Specimen: BLOOD  Result Value Ref Range Status   Specimen Description BLOOD LEFT ANTECUBITAL  Final   Special Requests   Final    BOTTLES DRAWN AEROBIC AND ANAEROBIC Blood Culture adequate volume   Culture   Final    NO GROWTH 1 DAY Performed at [facility], [address], [place], [place] [address]    Report Status PENDING  Incomplete  Urine Culture     Status: None   Collection Time: 03/26/24 10:05 AM   Specimen: Urine, Random  Result Value Ref Range Status   Specimen Description   Final    URINE,  RANDOM Performed at [facility], [address] [place], [place] [address]    Special Requests   Final    NONE Performed at [facility], [address], [place], [place] [address]    Culture   Final    NO GROWTH Performed at [facility], [address], [place], [place] [address]    Report Status 03/27/2024 FINAL  Final     Labs: BNP (last 3 results) No results for input(s): BNP in the last 8760 hours. Basic Metabolic Panel: Recent Labs  Lab 03/26/24 0704 03/27/24 0431  NA 142 144  K 4.0 3.9  CL 103 107  CO2 24 26  GLUCOSE 84 114*  BUN 54* 41*  CREATININE 2.14* 1.77*  CALCIUM  8.5* 8.0*  MG  --  1.7   Liver Function Tests: Recent Labs  Lab 03/26/24 0704  AST 75*  ALT 35  ALKPHOS 63  BILITOT 0.2  PROT 7.1  ALBUMIN 4.0   No results for input(s): LIPASE, AMYLASE in the last 168 hours. No results for input(s): AMMONIA in the last 168 hours. CBC: Recent Labs  Lab 03/26/24 0704 03/27/24 0431  WBC 5.3 5.4  NEUTROABS 3.5  --   HGB 11.6* 9.9*  HCT 36.4 32.3*  MCV 95.0 97.3  PLT 122* 110*   Cardiac Enzymes: No results for input(s): CKTOTAL, CKMB, CKMBINDEX, TROPONINI in the last 168 hours. BNP: Invalid input(s): POCBNP CBG: Recent Labs  Lab 03/26/24 2034 03/26/24 2341 03/27/24 0354 03/27/24 0721 03/27/24 1125  GLUCAP 112* 118* 118* 123* 158*   D-Dimer No results for input(s): DDIMER in the last 72 hours. Hgb A1c No results for input(s): HGBA1C in the last 72 hours. Lipid Profile No results for input(s): CHOL, HDL, LDLCALC, TRIG, CHOLHDL, LDLDIRECT in the last 72 hours. Thyroid  function studies No results for input(s): TSH, T4TOTAL, T3FREE, THYROIDAB in the last 72 hours.  Invalid input(s): FREET3 Anemia work up No results for input(s): VITAMINB12, FOLATE, FERRITIN, TIBC, IRON, RETICCTPCT in the last 72 hours. Urinalysis    Component Value Date/Time   COLORURINE YELLOW 03/26/2024  1005   APPEARANCEUR HAZY (A) 03/26/2024 1005   APPEARANCEUR Clear 07/11/2023 1054   LABSPEC 1.011 03/26/2024  1005   PHURINE 5.0 03/26/2024 1005   GLUCOSEU >=500 (A) 03/26/2024 1005   HGBUR SMALL (A) 03/26/2024 1005   BILIRUBINUR NEGATIVE 03/26/2024 1005   BILIRUBINUR Negative 07/11/2023 1054   KETONESUR NEGATIVE 03/26/2024 1005   PROTEINUR 30 (A) 03/26/2024 1005   UROBILINOGEN 0.2 10/27/2019 1034   NITRITE NEGATIVE 03/26/2024 1005   LEUKOCYTESUR NEGATIVE 03/26/2024 1005   Sepsis Labs Recent Labs  Lab 03/26/24 0704 03/27/24 0431  WBC 5.3 5.4   Microbiology Recent Results (from the past 240 hours)  Group A Strep by PCR     Status: None   Collection Time: 03/26/24  6:44 AM   Specimen: Throat; Sterile Swab  Result Value Ref Range Status   Group A Strep by PCR NOT DETECTED NOT DETECTED Final    Comment: Performed at [facility], [address], [place], [place] [address]  Resp panel by RT-PCR (RSV, Flu A&B, Covid) Throat     Status: None   Collection Time: 03/26/24  6:44 AM   Specimen: Throat; Nasal Swab  Result Value Ref Range Status   SARS Coronavirus 2 by RT PCR NEGATIVE NEGATIVE Final    Comment: (NOTE) SARS-CoV-2 target nucleic acids are NOT DETECTED.  The SARS-CoV-2 RNA is generally detectable in upper respiratory specimens during the acute phase of infection. The lowest concentration of SARS-CoV-2 viral copies this assay can detect is 138 copies/mL. A negative result does not preclude SARS-Cov-2 infection and should not be used as the sole basis for treatment or other patient management decisions. A negative result may occur with  improper specimen collection/handling, submission of specimen other than nasopharyngeal swab, presence of viral mutation(s) within the areas targeted by this assay, and inadequate number of viral copies(<138 copies/mL). A negative result must be combined with clinical observations, patient history, and epidemiological information. The  expected result is Negative.  Fact Sheet for Patients:  BloggerCourse.com  Fact Sheet for Healthcare Providers:  SeriousBroker.it  This test is no t yet approved or cleared by the [place]  FDA and  has been authorized for detection and/or diagnosis of SARS-CoV-2 by FDA under an Emergency Use Authorization (EUA). This EUA will remain  in effect (meaning this test can be used) for the duration of the COVID-19 declaration under Section 564(b)(1) of the Act, 21 U.S.C.section 360bbb-3(b)(1), unless the authorization is terminated  or revoked sooner.       Influenza A by PCR NEGATIVE NEGATIVE Final   Influenza B by PCR NEGATIVE NEGATIVE Final    Comment: (NOTE) The Xpert Xpress SARS-CoV-2/FLU/RSV plus assay is intended as an aid in the diagnosis of influenza from Nasopharyngeal swab specimens and should not be used as a sole basis for treatment. Nasal washings and aspirates are unacceptable for Xpert Xpress SARS-CoV-2/FLU/RSV testing.  Fact Sheet for Patients: BloggerCourse.com  Fact Sheet for Healthcare Providers: SeriousBroker.it  This test is not yet approved or cleared by the [place]  FDA and has been authorized for detection and/or diagnosis of SARS-CoV-2 by FDA under an Emergency Use Authorization (EUA). This EUA will remain in effect (meaning this test can be used) for the duration of the COVID-19 declaration under Section 564(b)(1) of the Act, 21 U.S.C. section 360bbb-3(b)(1), unless the authorization is terminated or revoked.     Resp Syncytial Virus by PCR NEGATIVE NEGATIVE Final    Comment: (NOTE) Fact Sheet for Patients: BloggerCourse.com  Fact Sheet for Healthcare Providers: SeriousBroker.it  This test is not yet approved or cleared by the [place]  FDA and has  been authorized for detection and/or  diagnosis of SARS-CoV-2 by FDA under an Emergency Use Authorization (EUA). This EUA will remain in effect (meaning this test can be used) for the duration of the COVID-19 declaration under Section 564(b)(1) of the Act, 21 U.S.C. section 360bbb-3(b)(1), unless the authorization is terminated or revoked.  Performed at [facility], [address], [place], [place] [address]   Blood culture (routine x 2)     Status: None (Preliminary result)   Collection Time: 03/26/24  6:55 AM   Specimen: BLOOD  Result Value Ref Range Status   Specimen Description BLOOD RIGHT ANTECUBITAL  Final   Special Requests   Final    BOTTLES DRAWN AEROBIC AND ANAEROBIC Blood Culture adequate volume   Culture   Final    NO GROWTH 1 DAY Performed at [facility], [address] [place], [place] [address]    Report Status PENDING  Incomplete  Blood culture (routine x 2)     Status: None (Preliminary result)   Collection Time: 03/26/24  7:05 AM   Specimen: BLOOD  Result Value Ref Range Status   Specimen Description BLOOD LEFT ANTECUBITAL  Final   Special Requests   Final    BOTTLES DRAWN AEROBIC AND ANAEROBIC Blood Culture adequate volume   Culture   Final    NO GROWTH 1 DAY Performed at [facility], [address], [place], [place] [address]    Report Status PENDING  Incomplete  Urine Culture     Status: None   Collection Time: 03/26/24 10:05 AM   Specimen: Urine, Random  Result Value Ref Range Status   Specimen Description   Final    URINE, RANDOM Performed at [facility], [address], [place], [place] [address]    Special Requests   Final    NONE Performed at [facility], [address], [place], [place] [address]    Culture   Final    NO GROWTH Performed at [facility], [address] [place], [place] [address]    Report Status 03/27/2024 FINAL  Final     Time coordinating discharge: 35 minutes  SIGNED:   Adron JONETTA Fairly, DO [facility] 03/27/2024, 2:45 PM  If 7PM-7AM,  please contact night-coverage [email or web address]

## 2024-03-27 NOTE — Evaluation (Signed)
 Physical Therapy Evaluation Patient Details Name: Allison Oneill MRN: [id number] DOB: 10-Jul-1950 Today's Date: 03/27/2024  History of Present Illness  Allison Oneill is a 73 y.o. female with medical history significant for hypertension, type 2 diabetes, CKD stage III AA, sciatica, and urolithiasis who presented to the ED with history of worsening left-sided flank pain, fevers, weakness, and falls over the last 2 days.  She was also noted to have some nausea, but no vomiting and states that she has had similar symptoms when she has had prior nephrolithiasis issues.  She fell last night and this morning but denies any injuries.  Flank pain is described as sharp and constant and is overall nonradiating.   Clinical Impression  Pt. Presented with generalized weakness in the LE, during session. Pt. Needed CGA for bed mobility to EOB with RW, and CGA/Min A with transfer from bed to chair and ambulation with RW. Towards the end of ambulation pt. Felt fatigued. Pt. SpO2 maintained at 92% on room air during ambulation. Nursing staff was notified on pt. Status. Patient will benefit from continued skilled physical therapy in hospital and recommended venue below to increase strength, balance, endurance for safe ADLs and gait.       If plan is discharge home, recommend the following: A little help with walking and/or transfers;Help with stairs or ramp for entrance;Assist for transportation;Assistance with cooking/housework   Can travel by private vehicle    Yes    Equipment Recommendations None recommended by PT  Recommendations for Other Services       Functional Status Assessment Patient has had a recent decline in their functional status and demonstrates the ability to make significant improvements in function in a reasonable and predictable amount of time.     Precautions / Restrictions Precautions Precautions: Fall Recall of Precautions/Restrictions: Intact Restrictions Weight Bearing  Restrictions Per Provider Order: No      Mobility  Bed Mobility Overal bed mobility: Needs Assistance Bed Mobility: Supine to Sit, Sit to Supine     Supine to sit: Contact guard, Used rails Sit to supine: Contact guard assist   General bed mobility comments: Labored movement; extended time; use of rail.    Transfers Overall transfer level: Needs assistance   Transfers: Sit to/from Stand, Bed to chair/wheelchair/BSC Sit to Stand: Contact guard assist, Min assist   Step pivot transfers: Contact guard assist, Min assist       General transfer comment: Chair to EOB without AD; unsteady; slow movement.    Ambulation/Gait Ambulation/Gait assistance: Min assist, Contact guard assist Gait Distance (Feet): 55 Feet Assistive device: Rolling walker (2 wheels) Gait Pattern/deviations: Decreased step length - right, Decreased step length - left, Decreased stance time - right, Decreased stance time - left, Decreased stride length, Narrow base of support       General Gait Details: pt. was able to ambulate with RW, pt. felt fatigues towards the end of treatment. Pt. maintain SpO2 at 92% during ambulation, on room air  Stairs            Wheelchair Mobility     Tilt Bed    Modified Rankin (Stroke Patients Only)       Balance Overall balance assessment: Needs assistance Sitting-balance support: No upper extremity supported, Feet supported Sitting balance-Leahy Scale: Good Sitting balance - Comments: seated at EOB   Standing balance support: No upper extremity supported, During functional activity Standing balance-Leahy Scale: Fair Standing balance comment: Poor to fair without AD; fair with RW  Pertinent Vitals/Pain Pain Assessment Pain Assessment: No/denies pain    Home Living Family/patient expects to be discharged to:: Private residence Living Arrangements: Spouse/significant other Available Help at Discharge:  Family;Available 24 hours/day Type of Home: House Home Access: Level entry       Home Layout: One level Home Equipment: Agricultural consultant (2 wheels);Cane - single point      Prior Function Prior Level of Function : Needs assist;Driving;History of Falls (last six months)       Physical Assist : ADLs (physical)   ADLs (physical): IADLs Mobility Comments: Community ambulator with SPC use PRN ADLs Comments: Independent; drives     Extremity/Trunk Assessment   Upper Extremity Assessment Upper Extremity Assessment: Defer to OT evaluation RUE Deficits / Details: Reports R rotatory cuff tear a year ago that the pt never recieved any therapy for. Limited to 3-/5 shoulder flexion with near full P/ROM. Generally weak near 4/5 otherwise for R UE strength.    Lower Extremity Assessment Lower Extremity Assessment: Generalized weakness    Cervical / Trunk Assessment Cervical / Trunk Assessment: Normal  Communication   Communication Communication: No apparent difficulties    Cognition Arousal: Alert Behavior During Therapy: WFL for tasks assessed/performed   PT - Cognitive impairments: No apparent impairments                         Following commands: Intact       Cueing Cueing Techniques: Verbal cues, Tactile cues     General Comments      Exercises     Assessment/Plan    PT Assessment Patient needs continued PT services  PT Problem List Decreased strength;Decreased range of motion;Decreased activity tolerance;Decreased balance;Decreased mobility       PT Treatment Interventions DME instruction;Gait training;Stair training;Functional mobility training;Therapeutic activities;Therapeutic exercise;Patient/family education    PT Goals (Current goals can be found in the Care Plan section)  Acute Rehab PT Goals Patient Stated Goal: pt. wants to return to family PT Goal Formulation: With patient Time For Goal Achievement: 04/02/24 Potential to Achieve Goals:  Good    Frequency Min 3X/week     Co-evaluation PT/OT/SLP Co-Evaluation/Treatment: Yes Reason for Co-Treatment: To address functional/ADL transfers PT goals addressed during session: Mobility/safety with mobility OT goals addressed during session: ADL's and self-care       AM-PAC PT 6 Clicks Mobility  Outcome Measure Help needed turning from your back to your side while in a flat bed without using bedrails?: A Little Help needed moving from lying on your back to sitting on the side of a flat bed without using bedrails?: A Little Help needed moving to and from a bed to a chair (including a wheelchair)?: A Little Help needed standing up from a chair using your arms (e.g., wheelchair or bedside chair)?: A Little Help needed to walk in hospital room?: A Little Help needed climbing 3-5 steps with a railing? : A Lot 6 Click Score: 17    End of Session Equipment Utilized During Treatment: Gait belt Activity Tolerance: Patient tolerated treatment well;Patient limited by fatigue Patient left: in chair;with call bell/phone within reach Nurse Communication: Mobility status PT Visit Diagnosis: Unsteadiness on feet (R26.81);Repeated falls (R29.6);Muscle weakness (generalized) (M62.81)    Time: 9154-9092 PT Time Calculation (min) (ACUTE ONLY): 22 min   Charges:   PT Evaluation $PT Eval Moderate Complexity: 1 Mod PT Treatments $Therapeutic Activity: 8-22 mins PT General Charges $$ ACUTE PT VISIT: 1 Visit  Francesco Provencal, SPT

## 2024-03-31 LAB — CULTURE, BLOOD (ROUTINE X 2)
Culture: NO GROWTH
Culture: NO GROWTH
Special Requests: ADEQUATE
Special Requests: ADEQUATE

## 2024-04-13 NOTE — Progress Notes (Signed)
 Impression/Assessment:  History of recurrent urolithiasis-status post recent ESL of left lower pole stone with minimal stone burden  Uric acid urolithiasis--24-hour urine revealed relatively low uric acid composition  Plan:    History of Present Illness: Allison Oneill is a 73 y.o. year old female with a history of recurrent urolithiasis presents for follow-up.  She underwent ESWL of a 9 mm left lower pole stone on 10.1.2024.  She had CT stone protocol done 3 weeks after the lithotripsy as she had right sided flank pain.  This showed no right-sided urolithiasis, significantly decreased left renal stone burden.  5.6.2025: Here for routine check.  KUB done earlier this morning.  No stone related symptoms over the past 3 months.  I have not yet received 24-hour urine results.  11..11.2025:    Past Medical History:  Diagnosis Date   Anxiety    Arthritis    Diabetes mellitus without complication (HCC)    TYPE 2   Fibromyalgia    History of kidney stones    Hypertension    Left renal stone    Neuromuscular disorder (HCC)    restless leg syndrome   Neuropathy    FEET    Past Surgical History:  Procedure Laterality Date   ABDOMINAL HYSTERECTOMY  2000   COMPLETE   CYSTOSCOPY N/A 08/27/2013   Procedure: CYSTOSCOPY; REMOVAL OF BLADDER CALCULUS;  Surgeon: Emery LILLETTE Blaze, MD;  Location: [facility];  Service: Urology;  Laterality: N/A;   CYSTOSCOPY W/ URETERAL STENT PLACEMENT N/A 2012   CYSTOSCOPY W/ URETERAL STENT PLACEMENT Left 03/26/2024   Procedure: CYSTOSCOPY, WITH RETROGRADE PYELOGRAM AND URETERAL STENT INSERTION;  Surgeon: Sherrilee Belvie CROME, MD;  Location: [facility];  Service: Urology;  Laterality: Left;   CYSTOSCOPY W/ URETERAL STENT REMOVAL N/A 2013   EXTRACORPOREAL SHOCK WAVE LITHOTRIPSY Left 03/05/2023   Procedure: EXTRACORPOREAL SHOCK WAVE LITHOTRIPSY (ESWL);  Surgeon: Matilda Senior, MD;  Location: [facility];  Service: Urology;  Laterality: Left;   IR URETERAL STENT  LEFT NEW ACCESS W/O SEP NEPHROSTOMY CATH  08/07/2018   IR URETERAL STENT LEFT NEW ACCESS W/O SEP NEPHROSTOMY CATH  08/03/2021   LITHOTRIPSY  2012   NEPHROLITHOTOMY Left 08/07/2018   Procedure: NEPHROLITHOTOMY PERCUTANEOUS;  Surgeon: Matilda Senior, MD;  Location: [facility];  Service: Urology;  Laterality: Left;   NEPHROLITHOTOMY Left 08/03/2021   Procedure: NEPHROLITHOTOMY PERCUTANEOUS;  Surgeon: Matilda Senior, MD;  Location: [facility];  Service: Urology;  Laterality: Left;   NEUROMA SURGERY Left 1997   PARATHYROIDECTOMY Right 02/27/2019   Procedure: RIGHT INFERIOR PARATHYROIDECTOMY;  Surgeon: Eletha Boas, MD;  Location: [facility];  Service: General;  Laterality: Right;    Home Medications:  (Not in a hospital admission)   Allergies:  Allergies  Allergen Reactions   Alfuzosin  Other (See Comments)    Made her feel dizzy and out of it   Sulfa Antibiotics Itching and Rash    No family history on file.  Social History:  reports that she has never smoked. She has never used smokeless tobacco. She reports that she does not drink alcohol and does not use drugs.  ROS: A complete review of systems was performed.  All systems are negative except for pertinent findings as noted.  Physical Exam:  Vital signs in last 24 hours: @VSRANGES @ General:  Alert and oriented, No acute distress HEENT: Normocephalic, atraumatic Neck: No JVD or lymphadenopathy Cardiovascular: Regular rate  Lungs: Normal inspiratory/expiratory excursion Abdomen: Soft, nontender, nondistended, no abdominal masses Back: No CVA tenderness  Extremities: No edema Neurologic: Grossly intact  I have reviewed prior pt notes  I have reviewed urinalysis results  KUB images reviewed with patient.  No calcifications overlying the right renal contour.  Small dustlike appearance of calcification in the lower pole of left kidney approximately 3 to 4 mm in size.  Otherwise, no evidence of further urolithiasis  24-hour urine results  sent/received: Urine volume-1.06 L Calcium  level 34 Citrate low at 238 Uric acid saturation slightly high at 1.34 Stone composition 20% calcium  oxalate monohydrate, 80% uric acid

## 2024-04-14 ENCOUNTER — Ambulatory Visit (HOSPITAL_COMMUNITY)
Admission: RE | Admit: 2024-04-14 | Discharge: 2024-04-14 | Disposition: A | Discharge status: Home / Self Care | Source: Ambulatory Visit | Attending: Urology | Admitting: Urology

## 2024-04-14 ENCOUNTER — Ambulatory Visit (INDEPENDENT_AMBULATORY_CARE_PROVIDER_SITE_OTHER): Admitting: Urology

## 2024-04-14 VITALS — BP 106/63 | HR 99

## 2024-04-14 DIAGNOSIS — N200 Calculus of kidney: Secondary | ICD-10-CM | POA: Diagnosis not present

## 2024-04-14 DIAGNOSIS — Z87442 Personal history of urinary calculi: Secondary | ICD-10-CM | POA: Diagnosis not present

## 2024-04-14 DIAGNOSIS — E210 Primary hyperparathyroidism: Secondary | ICD-10-CM

## 2024-04-14 LAB — MICROSCOPIC EXAMINATION
RBC, Urine: >30 /HPF — AB (ref 0–2)
WBC, UA: >30 /HPF — AB (ref 0–5)

## 2024-04-14 LAB — URINALYSIS, ROUTINE W REFLEX MICROSCOPIC
Bilirubin, UA: NEGATIVE
Ketones, UA: NEGATIVE
Nitrite, UA: NEGATIVE
Specific Gravity, UA: 1.01 (ref 1.005–1.030)
Urobilinogen, Ur: 0.2 mg/dL (ref 0.2–1.0)
pH, UA: 6.5 (ref 5.0–7.5)

## 2024-04-16 LAB — URINE CULTURE

## 2024-04-20 ENCOUNTER — Ambulatory Visit: Payer: Self-pay

## 2024-04-20 NOTE — Telephone Encounter (Signed)
 Called pt to let her know that her urine culture per MD Dahlstedt was negative left detailed message for patient

## 2024-04-20 NOTE — Telephone Encounter (Signed)
-----   Message from Garnette HERO Dahlstedt sent at 04/20/2024  8:58 AM EST ----- Let patient know that her urine culture was negative. ----- Message ----- From: Interface, [facility] Lab Results In Sent: 04/14/2024   3:36 PM EST To: Garnette Shack, MD

## 2024-04-21 ENCOUNTER — Telehealth: Payer: Self-pay

## 2024-04-21 NOTE — Telephone Encounter (Signed)
 Tried calling pt from VM with no answer. Pt state's she has a stent and she unable to deal with the stent and want it removed today.

## 2024-04-27 NOTE — Progress Notes (Unsigned)
 Impression/Assessment:  History of recurrent urolithiasis- now with stent in left ureter following presentation with left distal stone and AKI.  This may well pass when the stent is removed  Uric acid urolithiasis--24-hour urine revealed relatively low uric acid composition  Plan:     History of Present Illness: Allison Oneill is a 73 y.o. year old female with a history of recurrent urolithiasis presents for follow-up.  She underwent ESWL of a 9 mm left lower pole stone on 10.1.2024.  She had CT stone protocol done 3 weeks after the lithotripsy as she had right sided flank pain.  This showed no right-sided urolithiasis, significantly decreased left renal stone burden.  5.6.2025: Here for routine check.  KUB done earlier this morning.  No stone related symptoms over the past 3 months.  I have not yet received 24-hour urine results.  11..11.2025: She underwent urgent left ureteral stent placement on the 23rd of last month.  Cultures were negative but she did have AKI and mental status changes.  Currently not having any blood in the urine but she does have dysuria and some stent discomfort.  11.25.2025: Here for cysto/stent removal. Past Medical History:  Diagnosis Date   Anxiety    Arthritis    Diabetes mellitus without complication (HCC)    TYPE 2   Fibromyalgia    History of kidney stones    Hypertension    Left renal stone    Neuromuscular disorder (HCC)    restless leg syndrome   Neuropathy    FEET    Past Surgical History:  Procedure Laterality Date   ABDOMINAL HYSTERECTOMY  2000   COMPLETE   CYSTOSCOPY N/A 08/27/2013   Procedure: CYSTOSCOPY; REMOVAL OF BLADDER CALCULUS;  Surgeon: Emery LILLETTE Blaze, MD;  Location: [facility];  Service: Urology;  Laterality: N/A;   CYSTOSCOPY W/ URETERAL STENT PLACEMENT N/A 2012   CYSTOSCOPY W/ URETERAL STENT PLACEMENT Left 03/26/2024   Procedure: CYSTOSCOPY, WITH RETROGRADE PYELOGRAM AND URETERAL STENT INSERTION;  Surgeon: Sherrilee Belvie CROME, MD;  Location: [facility];  Service: Urology;  Laterality: Left;   CYSTOSCOPY W/ URETERAL STENT REMOVAL N/A 2013   EXTRACORPOREAL SHOCK WAVE LITHOTRIPSY Left 03/05/2023   Procedure: EXTRACORPOREAL SHOCK WAVE LITHOTRIPSY (ESWL);  Surgeon: Matilda Senior, MD;  Location: [facility];  Service: Urology;  Laterality: Left;   IR URETERAL STENT LEFT NEW ACCESS W/O SEP NEPHROSTOMY CATH  08/07/2018   IR URETERAL STENT LEFT NEW ACCESS W/O SEP NEPHROSTOMY CATH  08/03/2021   LITHOTRIPSY  2012   NEPHROLITHOTOMY Left 08/07/2018   Procedure: NEPHROLITHOTOMY PERCUTANEOUS;  Surgeon: Matilda Senior, MD;  Location: [facility];  Service: Urology;  Laterality: Left;   NEPHROLITHOTOMY Left 08/03/2021   Procedure: NEPHROLITHOTOMY PERCUTANEOUS;  Surgeon: Matilda Senior, MD;  Location: [facility];  Service: Urology;  Laterality: Left;   NEUROMA SURGERY Left 1997   PARATHYROIDECTOMY Right 02/27/2019   Procedure: RIGHT INFERIOR PARATHYROIDECTOMY;  Surgeon: Eletha Boas, MD;  Location: [facility];  Service: General;  Laterality: Right;    Home Medications:  (Not in a hospital admission)   Allergies:  Allergies  Allergen Reactions   Alfuzosin  Other (See Comments)    Made her feel dizzy and out of it   Sulfa Antibiotics Itching and Rash    No family history on file.  Social History:  reports that she has never smoked. She has never used smokeless tobacco. She reports that she does not drink alcohol and does not use drugs.  ROS: A complete review of  systems was performed.  All systems are negative except for pertinent findings as noted.  Physical Exam:  Vital signs in last 24 hours: @VSRANGES @ General:  Alert and oriented, No acute distress HEENT: Normocephalic, atraumatic Neck: No JVD or lymphadenopathy Cardiovascular: Regular rate  Extremities: No edema Neurologic: Grossly intact  I have reviewed prior pt notes  I have reviewed urinalysis results--infection a possibility  KUB images reviewed with  patient.  Stent adequately positioned.  I do not see any calcifications overlying the left renal contour.  There is a 4 mm calcification approximating the left distal part of the stent, probable ureteral stone.  24-hour urine results sent/received: Urine volume-1.06 L Calcium  level 34 Citrate low at 238 Uric acid saturation slightly high at 1.34 Stone composition 20% calcium  oxalate monohydrate, 80% uric acid   Cystoscopy/stent removal:

## 2024-04-28 ENCOUNTER — Ambulatory Visit (INDEPENDENT_AMBULATORY_CARE_PROVIDER_SITE_OTHER): Admitting: Urology

## 2024-04-28 ENCOUNTER — Ambulatory Visit (HOSPITAL_COMMUNITY)
Admission: RE | Admit: 2024-04-28 | Discharge: 2024-04-28 | Disposition: A | Discharge status: Home / Self Care | Source: Ambulatory Visit | Attending: Urology | Admitting: Urology

## 2024-04-28 VITALS — BP 104/64 | HR 102

## 2024-04-28 DIAGNOSIS — Z87440 Personal history of urinary (tract) infections: Secondary | ICD-10-CM

## 2024-04-28 DIAGNOSIS — N200 Calculus of kidney: Secondary | ICD-10-CM | POA: Insufficient documentation

## 2024-04-28 LAB — URINALYSIS, ROUTINE W REFLEX MICROSCOPIC
Bilirubin, UA: NEGATIVE
Ketones, UA: NEGATIVE
Nitrite, UA: NEGATIVE
Specific Gravity, UA: 1.015 (ref 1.005–1.030)
Urobilinogen, Ur: 0.2 mg/dL (ref 0.2–1.0)
pH, UA: 6 (ref 5.0–7.5)

## 2024-04-28 LAB — MICROSCOPIC EXAMINATION
RBC, Urine: >30 /HPF — AB (ref 0–2)
WBC, UA: >30 /HPF — AB (ref 0–5)

## 2024-04-28 MED ORDER — LIDOCAINE HCL 1 % IJ SOLN
20.0000 mL | Freq: Once | INTRAMUSCULAR | Status: AC
  Administered 2024-04-28: 20 mL

## 2024-04-28 MED ORDER — CIPROFLOXACIN HCL 500 MG PO TABS: 500.0000 mg | ORAL_TABLET | Freq: Once | ORAL | Status: DC

## 2024-04-28 MED ORDER — CIPROFLOXACIN HCL 500 MG PO TABS: 500.0000 mg | ORAL_TABLET | Freq: Two times a day (BID) | ORAL | 0 refills | Status: AC

## 2024-04-28 MED ORDER — CEFTRIAXONE SODIUM 500 MG IJ SOLR
500.0000 mg | Freq: Once | INTRAMUSCULAR | Status: AC
  Administered 2024-04-28: 500 mg via INTRAMUSCULAR

## 2024-04-28 NOTE — Addendum Note (Signed)
 Addended by: SAMMIE EXIE HERO on: 04/28/2024 11:14 AM   Modules accepted: Orders

## 2024-04-30 LAB — URINE CULTURE

## 2024-05-04 ENCOUNTER — Ambulatory Visit: Payer: Self-pay

## 2024-05-04 NOTE — Telephone Encounter (Signed)
 Called pt making her aware detailed VM of MD response.

## 2024-05-04 NOTE — Telephone Encounter (Signed)
-----   Message from Garnette HERO Dahlstedt sent at 05/04/2024  8:40 AM EST ----- Let patient know that her urine culture was negative ----- Message ----- From: Interface, [facility] Lab Results In Sent: 04/28/2024   3:36 PM EST To: Garnette Shack, MD

## 2024-05-04 NOTE — Telephone Encounter (Signed)
 Pt return call and want to know if she need to still do the urine drop off. Pt state's she is not having any symptoms current.

## 2024-05-05 ENCOUNTER — Telehealth: Payer: Self-pay

## 2024-05-05 ENCOUNTER — Other Ambulatory Visit

## 2024-05-05 DIAGNOSIS — N200 Calculus of kidney: Secondary | ICD-10-CM

## 2024-05-05 LAB — MICROSCOPIC EXAMINATION

## 2024-05-05 LAB — URINALYSIS, ROUTINE W REFLEX MICROSCOPIC
Bilirubin, UA: NEGATIVE
Ketones, UA: NEGATIVE
Leukocytes,UA: NEGATIVE
Nitrite, UA: NEGATIVE
Specific Gravity, UA: 1.02 (ref 1.005–1.030)
Urobilinogen, Ur: 0.2 mg/dL (ref 0.2–1.0)
pH, UA: 6 (ref 5.0–7.5)

## 2024-05-05 NOTE — Telephone Encounter (Signed)
 Called pt left detailed VM making pt aware of urine drop off.

## 2024-05-05 NOTE — Telephone Encounter (Signed)
 Patient presents today with complaints of  Burning .  UA and Culture done today.  Dr. Matilda  reviewed results and no treatment  .  Patient aware of MD recommendations and that we will reach out with culture results.      [email or web address], CMA

## 2024-05-05 NOTE — Telephone Encounter (Signed)
 Patient left a urine specimen. Having symptoms of pain and burning during urination.

## 2024-05-05 NOTE — Telephone Encounter (Signed)
-----   Message from Garnette HERO Dahlstedt sent at 05/05/2024  2:15 PM EST ----- Let patient know urinalysis looks fine

## 2024-05-06 NOTE — Telephone Encounter (Signed)
 See other encounter from 05/05/2024

## 2024-05-08 ENCOUNTER — Encounter (HOSPITAL_COMMUNITY): Payer: Self-pay | Admitting: Urology

## 2024-07-03 NOTE — Progress Notes (Signed)
 Allison Oneill                                          MRN: [id number]   07/03/2024   The [facility] Quality Team Specialist reviewed this patient medical record for the purposes of chart review for care gap closure. The following were reviewed: chart review for care gap closure-glycemic status assessment.    [facility] Quality Team
